# Patient Record
Sex: Female | Born: 1970 | Race: Black or African American | Hispanic: No | Marital: Single | Smoking: Former smoker
Health system: Southern US, Community
[De-identification: ages and names within clinical notes are randomized; demographics above are authoritative.]

## PROBLEM LIST (undated history)

## (undated) DIAGNOSIS — I10 Essential (primary) hypertension: Secondary | ICD-10-CM

## (undated) DIAGNOSIS — F329 Major depressive disorder, single episode, unspecified: Secondary | ICD-10-CM

## (undated) DIAGNOSIS — K219 Gastro-esophageal reflux disease without esophagitis: Secondary | ICD-10-CM

## (undated) DIAGNOSIS — M7500 Adhesive capsulitis of unspecified shoulder: Secondary | ICD-10-CM

## (undated) DIAGNOSIS — Z8679 Personal history of other diseases of the circulatory system: Secondary | ICD-10-CM

## (undated) DIAGNOSIS — J45909 Unspecified asthma, uncomplicated: Secondary | ICD-10-CM

## (undated) DIAGNOSIS — G4730 Sleep apnea, unspecified: Secondary | ICD-10-CM

## (undated) DIAGNOSIS — F32A Depression, unspecified: Secondary | ICD-10-CM

## (undated) DIAGNOSIS — F419 Anxiety disorder, unspecified: Secondary | ICD-10-CM

## (undated) DIAGNOSIS — R102 Pelvic and perineal pain: Secondary | ICD-10-CM

## (undated) DIAGNOSIS — G43909 Migraine, unspecified, not intractable, without status migrainosus: Secondary | ICD-10-CM

## (undated) DIAGNOSIS — R21 Rash and other nonspecific skin eruption: Secondary | ICD-10-CM

## (undated) DIAGNOSIS — E7800 Pure hypercholesterolemia, unspecified: Secondary | ICD-10-CM

## (undated) HISTORY — DX: Depression, unspecified: F32.A

## (undated) HISTORY — PX: CARPAL TUNNEL RELEASE: SHX101

## (undated) HISTORY — PX: ABDOMINAL HYSTERECTOMY: SHX81

## (undated) HISTORY — DX: Adhesive capsulitis of unspecified shoulder: M75.00

## (undated) HISTORY — DX: Major depressive disorder, single episode, unspecified: F32.9

## (undated) HISTORY — DX: Anxiety disorder, unspecified: F41.9

## (undated) HISTORY — DX: Migraine, unspecified, not intractable, without status migrainosus: G43.909

## (undated) HISTORY — DX: Sleep apnea, unspecified: G47.30

---

## 2004-01-20 ENCOUNTER — Emergency Department (HOSPITAL_COMMUNITY): Admission: EM | Admit: 2004-01-20 | Discharge: 2004-01-20 | Payer: Self-pay | Admitting: Emergency Medicine

## 2004-02-03 ENCOUNTER — Emergency Department (HOSPITAL_COMMUNITY): Admission: EM | Admit: 2004-02-03 | Discharge: 2004-02-04 | Payer: Self-pay | Admitting: Emergency Medicine

## 2005-07-09 ENCOUNTER — Emergency Department (HOSPITAL_COMMUNITY): Admission: EM | Admit: 2005-07-09 | Discharge: 2005-07-09 | Payer: Self-pay | Admitting: Emergency Medicine

## 2006-01-17 ENCOUNTER — Emergency Department (HOSPITAL_COMMUNITY): Admission: EM | Admit: 2006-01-17 | Discharge: 2006-01-17 | Payer: Self-pay | Admitting: Emergency Medicine

## 2006-01-18 ENCOUNTER — Emergency Department (HOSPITAL_COMMUNITY): Admission: EM | Admit: 2006-01-18 | Discharge: 2006-01-18 | Payer: Self-pay | Admitting: Emergency Medicine

## 2006-04-04 ENCOUNTER — Emergency Department (HOSPITAL_COMMUNITY): Admission: EM | Admit: 2006-04-04 | Discharge: 2006-04-04 | Payer: Self-pay | Admitting: Emergency Medicine

## 2010-09-02 HISTORY — PX: ABDOMINAL HYSTERECTOMY: SHX81

## 2012-11-23 ENCOUNTER — Other Ambulatory Visit (HOSPITAL_COMMUNITY): Payer: Self-pay | Admitting: Orthopedic Surgery

## 2012-11-23 DIAGNOSIS — M25562 Pain in left knee: Secondary | ICD-10-CM

## 2012-11-23 DIAGNOSIS — M25669 Stiffness of unspecified knee, not elsewhere classified: Secondary | ICD-10-CM

## 2016-04-16 ENCOUNTER — Encounter: Payer: Self-pay | Admitting: Pulmonary Disease

## 2016-04-16 LAB — PULMONARY FUNCTION TEST

## 2016-09-26 ENCOUNTER — Encounter: Payer: Self-pay | Admitting: Orthopaedic Surgery

## 2017-09-02 HISTORY — PX: BACK SURGERY: SHX140

## 2018-01-24 ENCOUNTER — Encounter: Payer: Self-pay | Admitting: Pulmonary Disease

## 2018-01-24 LAB — PULMONARY FUNCTION TEST

## 2018-04-21 HISTORY — PX: HAND TENDON SURGERY: SHX663

## 2018-06-17 ENCOUNTER — Encounter (HOSPITAL_COMMUNITY): Payer: Self-pay

## 2018-06-17 ENCOUNTER — Other Ambulatory Visit: Payer: Self-pay

## 2018-06-17 ENCOUNTER — Emergency Department (HOSPITAL_COMMUNITY)
Admission: EM | Admit: 2018-06-17 | Discharge: 2018-06-17 | Disposition: A | Discharge status: Home / Self Care | Payer: Self-pay | Attending: Emergency Medicine | Admitting: Emergency Medicine

## 2018-06-17 DIAGNOSIS — J45909 Unspecified asthma, uncomplicated: Secondary | ICD-10-CM | POA: Insufficient documentation

## 2018-06-17 DIAGNOSIS — Z91010 Allergy to peanuts: Secondary | ICD-10-CM | POA: Insufficient documentation

## 2018-06-17 DIAGNOSIS — M25512 Pain in left shoulder: Secondary | ICD-10-CM | POA: Insufficient documentation

## 2018-06-17 DIAGNOSIS — I10 Essential (primary) hypertension: Secondary | ICD-10-CM | POA: Insufficient documentation

## 2018-06-17 HISTORY — DX: Essential (primary) hypertension: I10

## 2018-06-17 HISTORY — DX: Unspecified asthma, uncomplicated: J45.909

## 2018-06-17 MED ORDER — PREDNISONE 20 MG PO TABS
60.0000 mg | ORAL_TABLET | Freq: Once | ORAL | Status: AC
  Administered 2018-06-17: 60 mg via ORAL
  Filled 2018-06-17: qty 3

## 2018-06-17 MED ORDER — KETOROLAC TROMETHAMINE 60 MG/2ML IM SOLN
60.0000 mg | Freq: Once | INTRAMUSCULAR | Status: AC
  Administered 2018-06-17: 60 mg via INTRAMUSCULAR
  Filled 2018-06-17: qty 2

## 2018-06-17 MED ORDER — PREDNISONE 20 MG PO TABS: 40.0000 mg | ORAL_TABLET | Freq: Every day | ORAL | 0 refills | Status: AC

## 2018-06-17 MED ORDER — OXYCODONE-ACETAMINOPHEN 5-325 MG PO TABS
2.0000 | ORAL_TABLET | Freq: Once | ORAL | Status: AC
Start: 2018-06-17 — End: 2018-06-17
  Administered 2018-06-17: 2 via ORAL
  Filled 2018-06-17: qty 2

## 2018-06-17 NOTE — ED Triage Notes (Signed)
Pt c/o increasing left shoulder and back pain. States she was Dx with torn tendons left shoulder a month ago, carpal tunnel surgery 6 weks ago to left hand, and S-1. L-5 spinal fusion surgery February 1st.

## 2018-06-17 NOTE — Discharge Instructions (Addendum)
Please call the orthopedic team for follow up

## 2018-06-17 NOTE — ED Provider Notes (Signed)
[facility] DEPT Provider Note   CSN: [id number] Arrival date & time: 06/17/18  0749     History   Chief Complaint Chief Complaint  Patient presents with  . Shoulder Pain    HPI Kathy Hodge is a 47 y.o. female.  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  Her orthopedic team and prior MRI were all performed in [place].  She just relocated to [place] to be closer to family.  She does not have an orthopedic team locally.  She currently does not have health insurance in the state of [place] either.  She presents with worsening severe pain of her left shoulder.  She states that she has oxycodone and muscle relaxants at home and these have not been helping.  She denies fevers and chills.  She reports sharp radiating pain coming down her left shoulder.  She has a history of prior cervical spine surgery.  She recently underwent left carpal tunnel release in [place] as well.  She is on disability reports will be several weeks until her disability allows her to have access to healthcare in the state [place].  Her pain is moderate to severe in severity.  She does not have a sling or shoulder immobilizer at home.   Past Medical History:  Diagnosis Date  . Asthma   . Hypertension     There are no active problems to display for this patient.   ** The histories are not reviewed yet. Please review them in the "History" navigator section and refresh this [place].   OB History   None      Home Medications    Prior to Admission medications   Medication Sig Start Date End Date Taking? Authorizing Provider  predniSONE (DELTASONE) 20 MG tablet Take 2 tablets (40 mg total) by mouth daily for 5 days. 06/17/18 06/22/18  Jola Schmidt, MD    Family History No family history on file.  Social  History Social History   Tobacco Use  . Smoking status: Not on file  Substance Use Topics  . Alcohol use: Not on file  . Drug use: Not on file     Allergies   Peanut-containing drug products   Review of Systems Review of Systems  All other systems reviewed and are negative.    Physical Exam Updated Vital Signs BP 117/75   Pulse 64   Temp 97.9 F (36.6 C) (Oral)   Resp 16   Ht 5\' 10"  (1.778 m)   Wt 104.3 kg   SpO2 99%   BMI 33.00 kg/m   Physical Exam  Constitutional: She is oriented to person, place, and time. She appears well-developed and well-nourished.  HENT:  Head: Normocephalic.  Eyes: EOM are normal.  Neck: Normal range of motion.  Pulmonary/Chest: Effort normal.  Abdominal: She exhibits no distension.  Musculoskeletal:  Mild pain with range of motion of left shoulder.  Normal left radial pulse.  Normal grip strength left hand.  Full range of motion of left wrist and left elbow.  No swelling of the left upper extremity as compared to the right.  Neurological: She is alert and oriented to person, place, and time.  Psychiatric: She has a normal mood and affect.  Nursing note and vitals reviewed.    ED Treatments / Results  Labs (all labs ordered are listed,  but only abnormal results are displayed) Labs Reviewed - No data to display  EKG None  Radiology No results found.  Procedures Procedures (including critical care time)  Medications Ordered in ED Medications  oxyCODONE-acetaminophen (PERCOCET/ROXICET) 5-325 MG per tablet 2 tablet (has no administration in time range)  ketorolac (TORADOL) injection 60 mg (has no administration in time range)  predniSONE (DELTASONE) tablet 60 mg (has no administration in time range)     Initial Impression / Assessment and Plan / ED Course  I have reviewed the triage vital signs and the nursing notes.  Pertinent labs & imaging results that were available during my care of the patient were reviewed by me  and considered in my medical decision making (see chart for details).     Symptomatic management here in the emergency department.  Prednisone, ketorolac, Percocet.  Patient informed that she will be unable to obtain opioid prescriptions from the emergency department.  At this time she still has enough opioid medications at home to help with pain control.  She has been given the number of the orthopedic team on call.  She has copies of her MRI as well as burn CDs with the images.  She will need to access care by the orthopedic team once her insurance situation is fixed.  No signs to suggest septic arthritis.  No indication for additional management or treatment here in the emergency department.  Patient will be discharged home with a short course of prednisone to help with inflammation.  Final Clinical Impressions(s) / ED Diagnoses   Final diagnoses:  Acute pain of left shoulder    ED Discharge Orders         Ordered    predniSONE (DELTASONE) 20 MG tablet  Daily     06/17/18 0838           Jola Schmidt, MD 06/17/18 [phone]

## 2018-06-23 ENCOUNTER — Ambulatory Visit (INDEPENDENT_AMBULATORY_CARE_PROVIDER_SITE_OTHER): Payer: Self-pay | Admitting: Family Medicine

## 2018-06-23 ENCOUNTER — Encounter: Payer: Self-pay | Admitting: Family Medicine

## 2018-06-23 ENCOUNTER — Other Ambulatory Visit: Payer: Self-pay

## 2018-06-23 VITALS — BP 113/74 | HR 74 | Temp 98.3°F | Resp 17 | Ht 70.0 in | Wt 236.8 lb

## 2018-06-23 DIAGNOSIS — Z23 Encounter for immunization: Secondary | ICD-10-CM

## 2018-06-23 DIAGNOSIS — Z130 Encounter for screening for diseases of the blood and blood-forming organs and certain disorders involving the immune mechanism: Secondary | ICD-10-CM

## 2018-06-23 DIAGNOSIS — G8929 Other chronic pain: Secondary | ICD-10-CM

## 2018-06-23 DIAGNOSIS — Z13220 Encounter for screening for lipoid disorders: Secondary | ICD-10-CM

## 2018-06-23 DIAGNOSIS — G5601 Carpal tunnel syndrome, right upper limb: Secondary | ICD-10-CM

## 2018-06-23 DIAGNOSIS — Z9889 Other specified postprocedural states: Secondary | ICD-10-CM | POA: Insufficient documentation

## 2018-06-23 DIAGNOSIS — Z9989 Dependence on other enabling machines and devices: Secondary | ICD-10-CM | POA: Insufficient documentation

## 2018-06-23 DIAGNOSIS — G4733 Obstructive sleep apnea (adult) (pediatric): Secondary | ICD-10-CM

## 2018-06-23 DIAGNOSIS — J4520 Mild intermittent asthma, uncomplicated: Secondary | ICD-10-CM

## 2018-06-23 DIAGNOSIS — I10 Essential (primary) hypertension: Secondary | ICD-10-CM

## 2018-06-23 DIAGNOSIS — Z131 Encounter for screening for diabetes mellitus: Secondary | ICD-10-CM

## 2018-06-23 DIAGNOSIS — M25512 Pain in left shoulder: Secondary | ICD-10-CM

## 2018-06-23 DIAGNOSIS — Z981 Arthrodesis status: Secondary | ICD-10-CM

## 2018-06-23 DIAGNOSIS — M67912 Unspecified disorder of synovium and tendon, left shoulder: Secondary | ICD-10-CM

## 2018-06-23 MED ORDER — ZOLPIDEM TARTRATE 5 MG PO TABS: 5.0000 mg | ORAL_TABLET | Freq: Every evening | ORAL | 0 refills | Status: DC | PRN

## 2018-06-23 NOTE — Patient Instructions (Addendum)
Go to community he will have labs drawn medication: [facility]  [address], [place], [place] [address] [phone]     .Thank you for choosing [facility] at [facility] for your medical home!    Kathy Hodge was seen by Molli Barrows, FNP today.   Kathy Hodge's primary care is Scot Jun, FNP.   For the best care possible,  you should try to see Molli Barrows, FNP  whenever you come to clinic.   We look forward to seeing you again soon!  If you have any questions about your visit today,  please call us at   Or feel free to reach your provider via [place].

## 2018-06-23 NOTE — Progress Notes (Signed)
Kathy Hodge, is a 47 y.o. female  YQM:[id number]  BMW:[id number]  DOB - 15-Sep-1970  CC:  Chief Complaint  Patient presents with  . Establish Care  . Hypertension  . Asthma    states that her breathing has been ok. health dept recently refilled inhaler & nebulizer solution       HPI: Kathy Hodge is a 47 y.o. female is here today to establish care.   Kathy Hodge has Other chronic pain; Essential hypertension; S/P spinal fusion; S/P carpal tunnel release Left ;Disorder of left rotator cuff; OSA on CPAP; Carpal tunnel syndrome of right wrist; and Intermittent asthma without complication on their problem list.  Today's visit:  Kathy Hodge recently relocated from [place] to live here in [place] to be close to family.  She has had a recent surgery including a spinal fusion (10/03/2017) and  left carpal tunnel release (04/21/18), she suffers from chronic back pain and currently has a disorder of the left rotator cuff for which she presented to the ER due to acute pain 06/17/2018. She is currently prescribed chronic pain medication-Percocet for which she is out of medication although has a prescription from her last office visit in [place] less than one month ago. Uncertain if prescription is able to be filled here in [place] although she hasn't tried. In [place], she was previously followed by the following specialties: Neurology, orthopedic surgery, pulmonology and pain management.  She is in the process of having her Medicaid/ Medicare transferred [place] as she is on disability. She is requesting a refills of Ambien and will need Tylenol 3 if she is unable to fill her prior prescription for Percocet.  She is requesting a referral to pain management to continue her current pain medication regimen.  [name] also suffers from hypertension which has been well-controlled with currently medication. She reports a history of LVH which was found after a cardiology consult which lead to a sleep study, which  subsequently lead to a diagnosis of OSA and she currently wears a CPAP. Denies chest pain, palpations, wheezing, shortness of breath, new weakness , numbness or tingling,or edema. Current medications: Current Outpatient Medications:  .  albuterol (PROVENTIL HFA;VENTOLIN HFA) 108 (90 Base) MCG/ACT inhaler, Inhale 1-2 puffs into the lungs every 6 (six) hours as needed for wheezing or shortness of breath., Disp: , Rfl:  .  albuterol (PROVENTIL) (2.5 MG/3ML) 0.083% nebulizer solution, Take 2.5 mg by nebulization every 6 (six) hours as needed for wheezing or shortness of breath., Disp: , Rfl:  .  amLODipine (NORVASC) 5 MG tablet, Take 5 mg by mouth daily., Disp: , Rfl:  .  fexofenadine-pseudoephedrine (ALLEGRA-D) 60-120 MG 12 hr tablet, Take 1 tablet by mouth 2 (two) times daily., Disp: , Rfl:  .  Fluticasone-Salmeterol 113-14 MCG/ACT AEPB, Inhale 1 puff into the lungs 2 (two) times daily., Disp: , Rfl:  .  gabapentin (NEURONTIN) 600 MG tablet, Take 600 mg by mouth 3 (three) times daily., Disp: , Rfl:  .  hydrochlorothiazide (MICROZIDE) 12.5 MG capsule, Take 12.5 mg by mouth daily., Disp: , Rfl:  .  montelukast (SINGULAIR) 10 MG tablet, Take 10 mg by mouth at bedtime., Disp: , Rfl:  .  oxyCODONE-acetaminophen (PERCOCET) 7.5-325 MG tablet, Take 1 tablet by mouth every 4 (four) hours as needed for severe pain., Disp: , Rfl:  .  topiramate (TOPAMAX) 25 MG tablet, Take 25 mg by mouth 2 (two) times daily., Disp: , Rfl:  .  zolpidem (AMBIEN) 5 MG tablet, Take 5  mg by mouth at bedtime as needed for sleep., Disp: , Rfl:    Pertinent family medical history: family history includes Bone cancer in her father; COPD in her mother; Diabetes in her mother.   Allergies  Allergen Reactions  . Peanut-Containing Drug Products Anaphylaxis    Social History   Socioeconomic History  . Marital status: Single    Spouse name: Not on file  . Number of children: Not on file  . Years of education: Not on file  . Highest  education level: Not on file  Occupational History  . Not on file  Social Needs  . Financial resource strain: Not on file  . Food insecurity:    Worry: Not on file    Inability: Not on file  . Transportation needs:    Medical: Not on file    Non-medical: Not on file  Tobacco Use  . Smoking status: Former Research scientist (life sciences)  . Smokeless tobacco: Never Used  Substance and Sexual Activity  . Alcohol use: Not Currently  . Drug use: Not on file  . Sexual activity: Not on file  Lifestyle  . Physical activity:    Days per week: Not on file    Minutes per session: Not on file  . Stress: Not on file  Relationships  . Social connections:    Talks on phone: Not on file    Gets together: Not on file    Attends religious service: Not on file    Active member of club or organization: Not on file    Attends meetings of clubs or organizations: Not on file    Relationship status: Not on file  . Intimate partner violence:    Fear of current or ex partner: Not on file    Emotionally abused: Not on file    Physically abused: Not on file    Forced sexual activity: Not on file  Other Topics Concern  . Not on file  Social History Narrative  . Not on file    Review of Systems: Constitutional: Negative for fever, chills, diaphoresis, activity change, appetite change and fatigue. HENT: Negative for ear pain, nosebleeds, congestion, facial swelling, rhinorrhea, neck pain, neck stiffness and ear discharge.  Eyes: Negative for pain, discharge, redness, itching and visual disturbance. Respiratory: Negative for cough, choking, chest tightness, shortness of breath, wheezing and stridor.  Cardiovascular: Negative for chest pain, palpitations and leg swelling. Musculoskeletal: Positive back pain, Positive arthralgia and pain with walking and standing. Neurological: Negative for dizziness, tremors, seizures, syncope, facial asymmetry, speech difficulty, weakness, light-headedness, numbness and headaches.   Psychiatric/Behavioral: Negative for hallucinations, behavioral problems, confusion, dysphoric mood, decreased concentration and agitation.  Objective:   Vitals:   06/23/18 0918  BP: 113/74  Pulse: 74  Resp: 17  Temp: 98.3 F (36.8 C)    BP Readings from Last 3 Encounters:  06/23/18 113/74  06/17/18 (!) 163/98    Filed Weights   06/23/18 0918  Weight: 236 lb 12.8 oz (107.4 kg)      Physical Exam: Constitutional: Patient appears well-developed and well-nourished. No distress. HENT: Normocephalic, atraumatic, External right and left ear normal. Oropharynx is clear and moist.  Eyes: Conjunctivae and EOM are normal. PERRLA, no scleral icterus. Neck: Normal ROM. Neck supple. No JVD. No tracheal deviation. No thyromegaly. CVS: RRR, S1/S2 +, no murmurs, no gallops, no carotid bruit.  Pulmonary: Effort and breath sounds normal, no stridor, rhonchi, wheezes, rales.  Abdominal: Soft. BS +, no distension, tenderness, rebound or guarding.  Musculoskeletal:  deferred exam given patient current level of pain she could not ambulate to the exam table.  Neuro: Alert. Oriented. Abnormal gait.   Skin: Skin is warm and dry. No rash noted. Not diaphoretic. No erythema. No pallor. Psychiatric: Normal mood and affect. Behavior, judgment, thought content normal.    Assessment and plan:  1. Other chronic pain -Obtained a urine drug screen today. Agreed to bridge patient with Tylenol 3 until she is able to be seen by pain management. She currently has a prescription from her provider in [place]. However has not tried to fill medication here in [place]. Advised if unable to fill prescription by previous provider, pending UDS results will bridge until acceptance into pain management.  - [id number] 11+Oxyco+Alc+Crt-Bund - Ambulatory referral to Pain Clinic  2. Screening for diabetes mellitus - Hemoglobin A1c; Future  3. Essential hypertension, well controlled today. We have discussed target BP range and blood  pressure goal. I have advised patient to check BP regularly and to call us back or report to clinic if the numbers are consistently higher than 140/90. We discussed the importance of compliance with medical therapy and DASH diet recommended, consequences of uncontrolled hypertension discussed. Continue current BP medications - Comprehensive metabolic panel - Thyroid Panel With TSH; Future  4. Screening, lipid - Lipid panel; Future  5. Screening for deficiency anemia - CBC with Differential; Future  6. S/P spinal fusion - Ambulatory referral to Neurology - Ambulatory referral to Pain Clinic - Ambulatory referral to Orthopedic Surgery  7. S/P carpal tunnel release Left  - Ambulatory referral to Neurology - Ambulatory referral to Pain Clinic - Ambulatory referral to Orthopedic Surgery  8. Disorder of left rotator cuff - Ambulatory referral to Pain Clinic - Ambulatory referral to Orthopedic Surgery  9. Intermittent asthma without complication, unspecified asthma severity, stable -Flu shot given today -Recommended consistently taking antihistamines to prevent an asthma exacerbation given the recent move to [facility]. - Ambulatory referral to Pulmonology  10. OSA on CPAP - Ambulatory referral to Pulmonology  11. Carpal tunnel syndrome of right wrist - Ambulatory referral to Orthopedic Surgery  12. Need for immunization against influenza - Flu Vaccine QUAD 36+ mos IM  Return in about 1 month (around 07/24/2018), or f/u pain, BP, chronic condition, labs results.   Paper prescription for Ambien given to patient.  Meds ordered this encounter  Medications  . zolpidem (AMBIEN) 5 MG tablet    Sig: Take 1 tablet (5 mg total) by mouth at bedtime as needed for sleep.    Dispense:  20 tablet    Refill:  0    Orders Placed This Encounter  Procedures  . Flu Vaccine QUAD 36+ mos IM  . [id number] 11+Oxyco+Alc+Crt-Bund  . Hemoglobin A1c    Standing Status:   Future    Standing Expiration  Date:   06/24/2019  . Comprehensive metabolic panel    Standing Status:   Future    Standing Expiration Date:   06/24/2019    Order Specific Question:   Has the patient fasted?    Answer:   No  . CBC with Differential    Standing Status:   Future    Standing Expiration Date:   06/24/2019  . Thyroid Panel With TSH    Standing Status:   Future    Standing Expiration Date:   06/24/2019  . Lipid panel    Standing Status:   Future    Standing Expiration Date:   06/24/2019    Order Specific Question:  Has the patient fasted?    Answer:   No  . Ambulatory referral to Neurology    Referral Priority:   Routine    Referral Type:   Consultation    Referral Reason:   Specialty Services Required    Requested Specialty:   Neurology    Number of Visits Requested:   1  . Ambulatory referral to Pulmonology    Referral Priority:   Routine    Referral Type:   Consultation    Referral Reason:   Specialty Services Required    Requested Specialty:   Pulmonary Disease    Number of Visits Requested:   1  . Ambulatory referral to Pain Clinic    Referral Priority:   Routine    Referral Type:   Consultation    Referral Reason:   Specialty Services Required    Requested Specialty:   Pain Medicine    Number of Visits Requested:   1  . Ambulatory referral to Orthopedic Surgery    Referral Priority:   Routine    Referral Type:   Surgical    Referral Reason:   Specialty Services Required    Requested Specialty:   Orthopedic Surgery    Number of Visits Requested:   1    The patient was given clear instructions to go to ER or return to medical center if symptoms don't improve, worsen or new problems develop. The patient verbalized understanding. The patient was advised  to call and obtain lab results if they haven't heard anything from out office within 7-10 business days.  Molli Barrows, FNP Primary Care at [facility] [address], [place] [phone]:  [phone]    A total of 45 minutes spent, greater than 50 % of this time was spent counseling and coordination of care.  This note has been created with Surveyor, quantity. Any transcriptional errors are unintentional.

## 2018-06-26 LAB — DRUG SCREEN 764883 11+OXYCO+ALC+CRT-BUND
Amphetamines, Urine: NEGATIVE ng/mL
BENZODIAZ UR QL: NEGATIVE ng/mL
Cannabinoid Quant, Ur: NEGATIVE ng/mL
Cocaine (Metabolite): NEGATIVE ng/mL
Creatinine: 202.4 mg/dL (ref 20.0–300.0)
ETHANOL: NEGATIVE %
METHADONE SCREEN, URINE: NEGATIVE ng/mL
Meperidine: NEGATIVE ng/mL
OPIATE SCREEN URINE: NEGATIVE ng/mL
Phencyclidine: NEGATIVE ng/mL
Propoxyphene: NEGATIVE ng/mL
TRAMADOL: NEGATIVE ng/mL
pH, Urine: 6.5 (ref 4.5–8.9)

## 2018-06-26 LAB — OXYCODONE/OXYMORPHONE, CONFIRM
OXYCODONE/OXYMORPH: POSITIVE — AB
OXYCODONE: NEGATIVE
OXYMORPHONE (GC/MS): 774 ng/mL
OXYMORPHONE: POSITIVE — AB

## 2018-06-26 LAB — DRUG PROFILE 799023
Amobarbital: NEGATIVE
BARBITURATES: POSITIVE — AB
BUTALBITAL GC/MS: 219 ng/mL
Butalbital: POSITIVE — AB
PENTOBARBITAL LVL: NEGATIVE
Phenobarbital: NEGATIVE
Secobarbital: NEGATIVE

## 2018-07-23 ENCOUNTER — Institutional Professional Consult (permissible substitution): Payer: Self-pay | Admitting: Pulmonary Disease

## 2018-07-24 ENCOUNTER — Encounter: Payer: Self-pay | Admitting: Family Medicine

## 2018-07-24 ENCOUNTER — Ambulatory Visit: Payer: Medicaid Other | Admitting: Family Medicine

## 2018-07-24 VITALS — BP 121/76 | HR 55 | Temp 98.2°F | Resp 18 | Ht 69.0 in | Wt 236.0 lb

## 2018-07-24 DIAGNOSIS — Z981 Arthrodesis status: Secondary | ICD-10-CM | POA: Diagnosis not present

## 2018-07-24 DIAGNOSIS — Z9889 Other specified postprocedural states: Secondary | ICD-10-CM

## 2018-07-24 DIAGNOSIS — J4520 Mild intermittent asthma, uncomplicated: Secondary | ICD-10-CM

## 2018-07-24 DIAGNOSIS — G5601 Carpal tunnel syndrome, right upper limb: Secondary | ICD-10-CM

## 2018-07-24 DIAGNOSIS — Z130 Encounter for screening for diseases of the blood and blood-forming organs and certain disorders involving the immune mechanism: Secondary | ICD-10-CM

## 2018-07-24 DIAGNOSIS — I10 Essential (primary) hypertension: Secondary | ICD-10-CM

## 2018-07-24 DIAGNOSIS — G8929 Other chronic pain: Secondary | ICD-10-CM

## 2018-07-24 DIAGNOSIS — G4709 Other insomnia: Secondary | ICD-10-CM

## 2018-07-24 DIAGNOSIS — Z131 Encounter for screening for diabetes mellitus: Secondary | ICD-10-CM

## 2018-07-24 MED ORDER — TRAMADOL HCL 50 MG PO TABS: 50.0000 mg | ORAL_TABLET | Freq: Three times a day (TID) | ORAL | 0 refills | Status: AC | PRN

## 2018-07-24 MED ORDER — ZOLPIDEM TARTRATE 5 MG PO TABS: 5.0000 mg | ORAL_TABLET | Freq: Every evening | ORAL | 1 refills | Status: DC | PRN

## 2018-07-24 NOTE — Progress Notes (Signed)
Patient ID: Kathy Hodge, female    DOB: [birth year], 47 y.o.   MRN: [id number]  PCP: Scot Jun, FNP  Chief Complaint  Patient presents with  . Follow-up    Subjective:  HPI Kathy Hodge is a 47 y.o. female presents for evaluation presents for follow-up from recent visit in which she establish care.  Patient suffers from chronic pain related to chronic musculoskeletal conditions. She is here today as her Medicaid was recently approved and she needs to follow-up on prior referral request. She needs a new referral placed to [facility] and has been contacted by [facility] regarding shoulder and carpal tunnel repair.   During her last visit her blood pressure was elevated.  Blood pressures well controlled today.  She reports consistently taking medications.  Denies chest pain, shortness of breath, lower extremity edema.  She is limited with exercise given her chronic pain and MSK problems. She is here today for labs although she is not fasting. Social History   Socioeconomic History  . Marital status: Single    Spouse name: Not on file  . Number of children: Not on file  . Years of education: Not on file  . Highest education level: Not on file  Occupational History  . Not on file  Social Needs  . Financial resource strain: Not on file  . Food insecurity:    Worry: Not on file    Inability: Not on file  . Transportation needs:    Medical: Not on file    Non-medical: Not on file  Tobacco Use  . Smoking status: Former Research scientist (life sciences)  . Smokeless tobacco: Never Used  Substance and Sexual Activity  . Alcohol use: Not Currently  . Drug use: Not on file  . Sexual activity: Yes  Lifestyle  . Physical activity:    Days per week: Not on file    Minutes per session: Not on file  . Stress: Not on file  Relationships  . Social connections:    Talks on phone: Not on file    Gets together: Not on file    Attends religious service: Not on file    Active member  of club or organization: Not on file    Attends meetings of clubs or organizations: Not on file    Relationship status: Not on file  . Intimate partner violence:    Fear of current or ex partner: Not on file    Emotionally abused: Not on file    Physically abused: Not on file    Forced sexual activity: Not on file  Other Topics Concern  . Not on file  Social History Narrative  . Not on file    Family History  Problem Relation Age of Onset  . COPD Mother   . Diabetes Mother   . Bone cancer Father    Review of Systems  Pertinent negatives listed in HPI  Patient Active Problem List   Diagnosis Date Noted  . Other chronic pain 06/23/2018  . Essential hypertension 06/23/2018  . S/P spinal fusion 06/23/2018  . S/P carpal tunnel release Left  06/23/2018  . Disorder of left rotator cuff 06/23/2018  . OSA on CPAP 06/23/2018  . Carpal tunnel syndrome of right wrist 06/23/2018  . Intermittent asthma without complication 88/41/6606    Allergies  Allergen Reactions  . Peanut-Containing Drug Products Anaphylaxis    Prior to Admission medications   Medication Sig Start Date End Date Taking? Authorizing Provider  albuterol (PROVENTIL HFA;VENTOLIN HFA)  108 (90 Base) MCG/ACT inhaler Inhale 1-2 puffs into the lungs every 6 (six) hours as needed for wheezing or shortness of breath.   Yes [provider]  albuterol (PROVENTIL) (2.5 MG/3ML) 0.083% nebulizer solution Take 2.5 mg by nebulization every 6 (six) hours as needed for wheezing or shortness of breath.   Yes [provider]  amLODipine (NORVASC) 5 MG tablet Take 5 mg by mouth daily.   Yes [provider]  fexofenadine-pseudoephedrine (ALLEGRA-D) 60-120 MG 12 hr tablet Take 1 tablet by mouth 2 (two) times daily.   Yes [provider]  Fluticasone-Salmeterol 113-14 MCG/ACT AEPB Inhale 1 puff into the lungs 2 (two) times daily.   Yes [provider]  gabapentin (NEURONTIN) 600 MG tablet Take  600 mg by mouth 3 (three) times daily.   Yes [provider]  hydrochlorothiazide (MICROZIDE) 12.5 MG capsule Take 12.5 mg by mouth daily.   Yes [provider]  montelukast (SINGULAIR) 10 MG tablet Take 10 mg by mouth at bedtime.   Yes [provider]  oxyCODONE-acetaminophen (PERCOCET) 7.5-325 MG tablet Take 1 tablet by mouth every 4 (four) hours as needed for severe pain.   Yes [provider]  topiramate (TOPAMAX) 25 MG tablet Take 25 mg by mouth 2 (two) times daily.   Yes [provider]  zolpidem (AMBIEN) 5 MG tablet Take 1 tablet (5 mg total) by mouth at bedtime as needed for sleep. 06/23/18  Yes Scot Jun, FNP    Past Medical, Surgical Family and Social History reviewed and updated.    Objective:   Today's Vitals   07/24/18 1010  BP: 121/76  Pulse: (!) 55  Resp: 18  Temp: 98.2 F (36.8 C)  TempSrc: Oral  SpO2: 100%  Weight: 236 lb (107 kg)  Height: 5\' 9"  (1.753 m)  PainSc: 0-No pain    Wt Readings from Last 3 Encounters:  07/24/18 236 lb (107 kg)  06/23/18 236 lb 12.8 oz (107.4 kg)  06/17/18 230 lb (104.3 kg)   Physical Exam General appearance: alert, well developed, well nourished, cooperative and in no distress Head: Normocephalic, without obvious abnormality, atraumatic Respiratory: Respirations even and unlabored, normal respiratory rate Heart: rate and rhythm normal. No gallop or murmurs noted on exam  Extremities: No gross deformities Skin: Skin color, texture, turgor normal. No rashes seen  Psych: Appropriate mood and affect. Neurologic: Mental status: Alert, oriented to person, place, and time, thought content appropriate.    Assessment & Plan:  1. Essential hypertension, well controlled today. We have discussed target BP range and blood pressure goal. I have advised patient to check BP regularly and to call us back or report to clinic if the numbers are consistently higher than 140/90. We discussed the  importance of compliancce with medical therapy and DASH diet recommended, consequences of uncontrolled hypertension discussed. ontinue current BP medications 2. Carpal tunnel syndrome of right wrist 3. S/P spinal fusion 4. S/P carpal tunnel release Left  -Patient has been referred to [facility] however initially did not have insurance.  Her Medicaid has since been approved will refer her back to [facility] to have an appointment scheduled for further evaluation and management of the aforementioned conditions.  She is also been referred to pain management however agreed to bridge her with tramadol 50 mg every 6 hours as needed for pain until pain management referral has been accepted.  Patient has signed a medication contract.  5. Intermittent asthma without complication, unspecified asthma severity,  symptoms controlled today -Patient has been referred to pulmonology Continue albuterol inhaler every 4-6 hours as needed for shortness of breath and or wheezing.  6. Other insomnia, - controlled with Ambien   7. Other chronic pain -Referral placed to pain management -We will bridge tramadol 50 mg every 6 hours as needed for pain until patient is accepted into pain management Medication contract on file.   Meds ordered this encounter  Medications  . traMADol (ULTRAM) 50 MG tablet    Sig: Take 1 tablet (50 mg total) by mouth every 8 (eight) hours as needed.    Dispense:  90 tablet    Refill:  0    Call office for additional refills. No refill before 08/23/2018  . zolpidem (AMBIEN) 5 MG tablet    Sig: Take 1 tablet (5 mg total) by mouth at bedtime as needed for sleep.    Dispense:  30 tablet    Refill:  1     -The patient was given clear instructions to go to ER or return to medical center if symptoms do not improve, worsen or new problems develop. The patient verbalized understanding.    Molli Barrows, FNP [facility] at [facility] [address]  [place] [phone]: [phone]

## 2018-07-24 NOTE — Patient Instructions (Signed)

## 2018-08-03 ENCOUNTER — Telehealth: Payer: Self-pay | Admitting: Family Medicine

## 2018-08-03 NOTE — Telephone Encounter (Signed)
Referral is already in chart. Was ordered on 07/24/18

## 2018-08-03 NOTE — Telephone Encounter (Signed)
Patient called stating that she now has medicaid of [place] and would like referral to pain management clinic, please follow up.

## 2018-08-07 NOTE — Telephone Encounter (Signed)
Patient came in office to try to get medication refilled. Was advised that provider would look into it. States that she will try to go back to the office where she got it filled.

## 2018-08-07 NOTE — Telephone Encounter (Signed)
Patient called requesting a refill of a muscle relaxer, please follow up

## 2018-08-10 ENCOUNTER — Telehealth: Payer: Self-pay | Admitting: Family Medicine

## 2018-08-10 NOTE — Telephone Encounter (Signed)
Thanks for the update

## 2018-08-10 NOTE — Telephone Encounter (Signed)
Patient stated she is not taking gabapentin and is upset because she wants a refill of tinizidine, patient states she would be switching providers.

## 2018-08-10 NOTE — Telephone Encounter (Signed)
Advise patient  I will not fill tinizidine.  She is currently prescribed gabapentin, tramadol, topomax, and ambiem which are all sedative medications.  She has been referred to pain management. I encourage her to add tylenol to her current regimen. There is no need to prescribe this medication as it has a similar effectiveness on pain as the Gabapentin.

## 2018-08-11 ENCOUNTER — Ambulatory Visit: Payer: Medicaid Other | Admitting: Pulmonary Disease

## 2018-08-11 ENCOUNTER — Encounter: Payer: Self-pay | Admitting: Pulmonary Disease

## 2018-08-11 VITALS — BP 124/88 | HR 62 | Ht 70.0 in | Wt 236.0 lb

## 2018-08-11 DIAGNOSIS — G4700 Insomnia, unspecified: Secondary | ICD-10-CM | POA: Diagnosis not present

## 2018-08-11 DIAGNOSIS — Z9989 Dependence on other enabling machines and devices: Secondary | ICD-10-CM

## 2018-08-11 DIAGNOSIS — G478 Other sleep disorders: Secondary | ICD-10-CM

## 2018-08-11 DIAGNOSIS — G4733 Obstructive sleep apnea (adult) (pediatric): Secondary | ICD-10-CM

## 2018-08-11 DIAGNOSIS — J4540 Moderate persistent asthma, uncomplicated: Secondary | ICD-10-CM

## 2018-08-11 MED ORDER — ZOLPIDEM TARTRATE 10 MG PO TABS: 10.0000 mg | ORAL_TABLET | Freq: Every evening | ORAL | 3 refills | Status: DC | PRN

## 2018-08-11 NOTE — Progress Notes (Signed)
Kathy Hodge    [id number]    [birth year]  Primary Care Physician:Harris, Carroll Sage, FNP  Referring Physician: Scot Jun, [place] [address], [place] [address]  Chief complaint:   History of obstructive sleep apnea History of insomnia  HPI:  Patient with a history of obstructive sleep apnea Has not been able to use her machine recently secondary to having the wrong mask Diagnosed with moderate obstructive sleep apnea Denies any difficulty tolerating the pressures Usually tries to go to bed about 05/04/2009 pM Takes about an hour to fall asleep Usually tries to get up finally at about 6:30 AM Wakes up 4-5 times at night  She has been using Ambien on a regular basis to help sleep History of hypertension, cardiac rhythm problems History of asthma-well-controlled History of allergies History of chronic back pain which may be contributing to multiple awakenings at night  She is a reformed smoker  Outpatient Encounter Medications as of 08/11/2018  Medication Sig  . albuterol (PROVENTIL HFA;VENTOLIN HFA) 108 (90 Base) MCG/ACT inhaler Inhale 1-2 puffs into the lungs every 6 (six) hours as needed for wheezing or shortness of breath.  Marland Kitchen albuterol (PROVENTIL) (2.5 MG/3ML) 0.083% nebulizer solution Take 2.5 mg by nebulization every 6 (six) hours as needed for wheezing or shortness of breath.  Marland Kitchen amLODipine (NORVASC) 5 MG tablet Take 5 mg by mouth daily.  . fexofenadine-pseudoephedrine (ALLEGRA-D) 60-120 MG 12 hr tablet Take 1 tablet by mouth 2 (two) times daily.  . Fluticasone-Salmeterol 113-14 MCG/ACT AEPB Inhale 1 puff into the lungs 2 (two) times daily.  Marland Kitchen gabapentin (NEURONTIN) 600 MG tablet Take 600 mg by mouth 3 (three) times daily.  . hydrochlorothiazide (MICROZIDE) 12.5 MG capsule Take 12.5 mg by mouth daily.  . montelukast (SINGULAIR) 10 MG tablet Take 10 mg by mouth at bedtime.  . topiramate (TOPAMAX) 25 MG tablet Take 25 mg by mouth 2 (two)  times daily.  . traMADol (ULTRAM) 50 MG tablet Take 1 tablet (50 mg total) by mouth every 8 (eight) hours as needed.  . zolpidem (AMBIEN) 5 MG tablet Take 1 tablet (5 mg total) by mouth at bedtime as needed for sleep.   No facility-administered encounter medications on file as of 08/11/2018.     Allergies as of 08/11/2018 - Review Complete 08/11/2018  Allergen Reaction Noted  . Peanut-containing drug products Anaphylaxis 06/17/2018    Past Medical History:  Diagnosis Date  . Asthma   . Hypertension   . Sleep apnea     Past Surgical History:  Procedure Laterality Date  . ABDOMINAL HYSTERECTOMY    . BACK SURGERY    . CESAREAN SECTION    . HAND TENDON SURGERY Left 04/21/2018    Family History  Problem Relation Age of Onset  . COPD Mother   . Diabetes Mother   . Bone cancer Father     Social History   Socioeconomic History  . Marital status: Single    Spouse name: Not on file  . Number of children: Not on file  . Years of education: Not on file  . Highest education level: Not on file  Occupational History  . Not on file  Social Needs  . Financial resource strain: Not on file  . Food insecurity:    Worry: Not on file    Inability: Not on file  . Transportation needs:    Medical: Not on file    Non-medical: Not on file  Tobacco Use  . Smoking status: Former Research scientist (life sciences)  . Smokeless tobacco: Never Used  Substance and Sexual Activity  . Alcohol use: Not Currently  . Drug use: Not on file  . Sexual activity: Yes  Lifestyle  . Physical activity:    Days per week: Not on file    Minutes per session: Not on file  . Stress: Not on file  Relationships  . Social connections:    Talks on phone: Not on file    Gets together: Not on file    Attends religious service: Not on file    Active member of club or organization: Not on file    Attends meetings of clubs or organizations: Not on file    Relationship status: Not on file  . Intimate partner violence:    Fear of  current or ex partner: Not on file    Emotionally abused: Not on file    Physically abused: Not on file    Forced sexual activity: Not on file  Other Topics Concern  . Not on file  Social History Narrative  . Not on file    Review of Systems  Constitutional: Negative.   HENT: Negative.   Eyes: Negative.   Respiratory: Positive for apnea and shortness of breath. Negative for wheezing.   Cardiovascular: Negative.   Gastrointestinal: Negative.   Genitourinary: Negative.   Psychiatric/Behavioral: Positive for sleep disturbance.  All other systems reviewed and are negative.   Vitals:   08/11/18 1423  BP: 124/88  Pulse: 62  SpO2: 97%     Physical Exam  Constitutional: She is oriented to person, place, and time. She appears well-developed and well-nourished.  HENT:  Head: Normocephalic and atraumatic.  Mallampati 3  Eyes: Pupils are equal, round, and reactive to light. Right eye exhibits no discharge. Left eye exhibits no discharge.  Neck: Normal range of motion. Neck supple. No tracheal deviation present. No thyromegaly present.  Cardiovascular: Normal rate and regular rhythm.  Pulmonary/Chest: Effort normal and breath sounds normal. No respiratory distress. She has no wheezes. She has no rales. She exhibits no tenderness.  Abdominal: Soft. Bowel sounds are normal. She exhibits no distension. There is no tenderness.  Musculoskeletal: Normal range of motion. She exhibits no edema or deformity.  Neurological: She is alert and oriented to person, place, and time.   Sleep study was from last year, not available at present  Assessment:  Sleep onset and maintenance insomnia  Moderate obstructive sleep apnea-on CPAP therapy  Plan/Recommendations: Continue CPAP use  We will obtain a download from her machine at next visit  Prescription for Ambien for insomnia was provided  Importance of compliance with CPAP was reiterated  Exercise and weight loss discussed  We will see  her back in the office in about 2 to 3 months Encouraged to call with any significant concerns  Sherrilyn Rist MD [facility] 08/11/2018, 2:26 PM  CC: Scot Jun, FNP

## 2018-08-11 NOTE — Patient Instructions (Signed)
History of obstructive sleep apnea History of insomnia  We will call to get a copy of your sleep study results We will try and get a download from the machine in a couple of months  We will try and set you up with a DME company  I will see back in the office in about 2 months

## 2018-08-14 ENCOUNTER — Encounter (INDEPENDENT_AMBULATORY_CARE_PROVIDER_SITE_OTHER): Payer: Self-pay | Admitting: Orthopaedic Surgery

## 2018-08-14 ENCOUNTER — Ambulatory Visit (INDEPENDENT_AMBULATORY_CARE_PROVIDER_SITE_OTHER): Payer: Medicaid Other | Admitting: Orthopaedic Surgery

## 2018-08-14 VITALS — BP 140/87 | HR 71 | Ht 70.0 in | Wt 230.0 lb

## 2018-08-14 DIAGNOSIS — M25512 Pain in left shoulder: Secondary | ICD-10-CM | POA: Diagnosis not present

## 2018-08-14 DIAGNOSIS — M5030 Other cervical disc degeneration, unspecified cervical region: Secondary | ICD-10-CM

## 2018-08-14 DIAGNOSIS — G8929 Other chronic pain: Secondary | ICD-10-CM

## 2018-08-14 NOTE — Progress Notes (Signed)
Office Visit Note   Patient: Kathy Hodge           Date of Birth: December 22, 1970           MRN: [id number] Visit Date: 08/14/2018              Requested by: Scot Jun, [address], [place] [address] PCP: Scot Jun, FNP   Assessment & Plan: Visit Diagnoses:  1. Chronic left shoulder pain   2. Other cervical disc degeneration, unspecified cervical region     Plan: Complex problem referable to left upper extremity.  Will described below.  MRI scan cervical spine.  Consider manipulation of left shoulder for adhesive capsulitis after reviewing scan of cervical spine.  Mrs. Kathy Hodge has significant problems with the left upper extremity.  I am not sure if her neck is creating some of the discomfort in her shoulder based on her recent EMGs and nerve conduction studies he did have some referred pain and there is evidence of a C5-6 radiculopathy.  There is not significant pathology in the left shoulder based on her prior MRI scan but she certainly has significant loss of motion with adhesive capsulitis.  I think  that has to be resolved before we consider any further diagnostic or treatment for the left shoulder  Follow-Up Instructions: Return after MRI c-spine.   Orders:  Orders Placed This Encounter  Procedures  . MR Cervical Spine w/o contrast   No orders of the defined types were placed in this encounter.     Procedures: No procedures performed   Clinical Data: No additional findings.   Subjective: Chief Complaint  Patient presents with  . Left Shoulder - Pain    Was told she has torn tendon, and frozen shoulder, and she has EMG done in [place] and it shows nerve problems.  Had surgery for Lt CTR and back surgery. Onset 12/2017.   Mrs. Kathy Hodge is 47 years old and visits the office for evaluation of a problem referable to her left upper extremity.  Her history is somewhat complicated and that she just recently moved to [place] from the [place] region.  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  Left was greater than right.  She also has developed significant pain in her left shoulder.  She has had several cortisone injections and a course of physical therapy.  She also had an MRI scan performed in August 2019 demonstrating supra and infraspinatus tendinosis with partial-thickness undersurface tearing of the distal rotator cuff.  No muscle atrophy.  There was a possibility of tear of the superior labral margin.  She had a follow-up EMG and nerve conduction study in October 2019 demonstrating a left C5-6 radiculopathy.  Mrs. Kathy Hodge complained of pain in her left shoulder during the needle EMG.  It also showed residual bilateral demyelinating and axonal neuropathy of the wrist consistent with her diagnosis of carpal tunnel syndrome an MRI scan was performed of the cervical spine in July 2018 without evidence of cervical disc herniation, spinal cord abnormalities or bone pathology.  All studies were performed in [place] Presently she is having trouble with her neck and with her left shoulder.  She wears a splint for the carpal tunnel release.  She is not working.  She has considerable difficulty trying to raise her left arm over her head despite the injections and PT.  HPI  Review of Systems   Objective: Vital Signs: BP 140/87 (BP Location: Right Arm, Patient Position: Sitting)   Pulse 71   Ht 5\' 10"  (1.778 m)   Wt 230 lb (104.3 kg)   BMI 33.00 kg/m   Physical Exam Constitutional:      Appearance: She is well-developed.  Eyes:     Pupils: Pupils are equal, round, and reactive to light.  Pulmonary:     Effort: Pulmonary effort is normal.  Skin:    General: Skin is warm and dry.  Neurological:      Mental Status: She is alert and oriented to person, place, and time.  Psychiatric:        Behavior: Behavior normal.      Ortho Exam awake alert and oriented x3.  Comfortable sitting.  Difficulty with motion of the cervical spine related to pain.  She could just barely touch her chin to her chest.  Had about 70% of normal neck extension.  Was able to rotate to the right and to the left without much trouble and notes specifically referred pain to her left shoulder.  She has significant loss of motion of the left shoulder with motion with only about 90 degrees of flexion and abduction some areas of tenderness about the anterior shoulder.  Skin intact.  Neurovascular exam intact.  I did not evaluate her left carpal tunnel as she is recently had surgery and wears a splint.  Specialty Comments:  No specialty comments available.  Imaging: No results found.   PMFS History: Patient Active Problem List   Diagnosis Date Noted  . Other chronic pain 06/23/2018  . Essential hypertension 06/23/2018  . S/P spinal fusion 06/23/2018  . S/P carpal tunnel release Left  06/23/2018  . Disorder of left rotator cuff 06/23/2018  . OSA on CPAP 06/23/2018  . Carpal tunnel syndrome of right wrist 06/23/2018  . Intermittent asthma without complication 68/07/5725   Past Medical History:  Diagnosis Date  . Asthma   . Hypertension   . Sleep apnea     Family History  Problem Relation Age of Onset  . COPD Mother   . Diabetes Mother   . Bone cancer Father     Past Surgical History:  Procedure Laterality Date  . ABDOMINAL HYSTERECTOMY    . BACK SURGERY    . CESAREAN SECTION    . HAND TENDON SURGERY Left 04/21/2018   Social History   Occupational History  . Not on file  Tobacco Use  . Smoking status: Former Research scientist (life sciences)  . Smokeless tobacco: Never Used  Substance and Sexual Activity  . Alcohol use: Not Currently  . Drug use: Not on file  . Sexual activity: Yes     Garald Balding,  MD   Note - This record has been created using [employer].  Chart creation errors have been sought, but may not always  have been located. Such creation errors do not reflect on  the standard of medical care.

## 2018-08-18 ENCOUNTER — Ambulatory Visit (INDEPENDENT_AMBULATORY_CARE_PROVIDER_SITE_OTHER): Payer: Medicaid Other | Admitting: Family Medicine

## 2018-08-18 ENCOUNTER — Encounter: Payer: Self-pay | Admitting: Family Medicine

## 2018-08-18 VITALS — BP 119/87 | HR 77 | Temp 98.9°F | Resp 17 | Ht 70.0 in | Wt 234.2 lb

## 2018-08-18 DIAGNOSIS — R350 Frequency of micturition: Secondary | ICD-10-CM

## 2018-08-18 DIAGNOSIS — Z01419 Encounter for gynecological examination (general) (routine) without abnormal findings: Secondary | ICD-10-CM | POA: Diagnosis not present

## 2018-08-18 DIAGNOSIS — R94130 Abnormal response to nerve stimulation, unspecified: Secondary | ICD-10-CM

## 2018-08-18 DIAGNOSIS — A084 Viral intestinal infection, unspecified: Secondary | ICD-10-CM | POA: Diagnosis not present

## 2018-08-18 DIAGNOSIS — R202 Paresthesia of skin: Secondary | ICD-10-CM

## 2018-08-18 DIAGNOSIS — G5603 Carpal tunnel syndrome, bilateral upper limbs: Secondary | ICD-10-CM

## 2018-08-18 LAB — POCT URINALYSIS DIP (CLINITEK)
Blood, UA: NEGATIVE
Glucose, UA: NEGATIVE mg/dL
Leukocytes, UA: NEGATIVE
NITRITE UA: NEGATIVE
POC PROTEIN,UA: NEGATIVE
Spec Grav, UA: 1.025 (ref 1.010–1.025)
UROBILINOGEN UA: 1 U/dL
pH, UA: 6 (ref 5.0–8.0)

## 2018-08-18 MED ORDER — DICYCLOMINE HCL 10 MG PO CAPS: 10.0000 mg | ORAL_CAPSULE | Freq: Three times a day (TID) | ORAL | 0 refills | Status: DC

## 2018-08-18 MED ORDER — ONDANSETRON 4 MG PO TBDP: 4.0000 mg | ORAL_TABLET | Freq: Three times a day (TID) | ORAL | 0 refills | Status: DC | PRN

## 2018-08-18 MED ORDER — TIZANIDINE HCL 4 MG PO CAPS: 4.0000 mg | ORAL_CAPSULE | Freq: Three times a day (TID) | ORAL | 0 refills | Status: DC

## 2018-08-18 MED ORDER — FAMOTIDINE 20 MG PO TABS: 20.0000 mg | ORAL_TABLET | Freq: Two times a day (BID) | ORAL | 1 refills | Status: DC

## 2018-08-18 NOTE — Progress Notes (Signed)
Acute Office Visit  Subjective:    Patient ID: Kathy Hodge, female    DOB: [birth year], 47 y.o.   MRN: [id number]  Chief Complaint  Patient presents with  . Diarrhea    stomach pain, nausea, diarrhea x 1 day    HPI Diarrhea  Concern for food poison. Reports two days of nausea, abdominal cramping, and diarrhea. Granddaughter has been sick with similar symptoms. She has continued to hydrate, although has continued to remain intolerable of food. She has not taken any over the counter medication. Denies fever, weakness, hemoptysis, or bloody stool.  Chronic pain, Carpal Tunnel Syndrome  Patient is requesting a refill on Zanaflex.  Medication was filled by another provider. Patient suffers from polypharmacy and was recently accepted into pain management. She is prescribed multiple sedative medications and Ambien for sleep. She is reports that he is not taking Gabapentin as Zanaflex works better with carpal tunnel and back pain. She recently established with orthopedics and was advised to request a referral to neurology as she has had a prior abnormal nerve conduction study and recommendation was made by a prior provider to have studies repeated.   Past Medical History:  Diagnosis Date  . Asthma   . Hypertension   . Sleep apnea     Past Surgical History:  Procedure Laterality Date  . ABDOMINAL HYSTERECTOMY    . BACK SURGERY    . CESAREAN SECTION    . HAND TENDON SURGERY Left 04/21/2018    Family History  Problem Relation Age of Onset  . COPD Mother   . Diabetes Mother   . Bone cancer Father     Social History   Socioeconomic History  . Marital status: Single    Spouse name: Not on file  . Number of children: Not on file  . Years of education: Not on file  . Highest education level: Not on file  Occupational History  . Not on file  Social Needs  . Financial resource strain: Not on file  . Food insecurity:    Worry: Not on file    Inability: Not on file  .  Transportation needs:    Medical: Not on file    Non-medical: Not on file  Tobacco Use  . Smoking status: Former Research scientist (life sciences)  . Smokeless tobacco: Never Used  Substance and Sexual Activity  . Alcohol use: Not Currently  . Drug use: Not on file  . Sexual activity: Yes  Lifestyle  . Physical activity:    Days per week: Not on file    Minutes per session: Not on file  . Stress: Not on file  Relationships  . Social connections:    Talks on phone: Not on file    Gets together: Not on file    Attends religious service: Not on file    Active member of club or organization: Not on file    Attends meetings of clubs or organizations: Not on file    Relationship status: Not on file  . Intimate partner violence:    Fear of current or ex partner: Not on file    Emotionally abused: Not on file    Physically abused: Not on file    Forced sexual activity: Not on file  Other Topics Concern  . Not on file  Social History Narrative  . Not on file    Outpatient Medications Prior to Visit  Medication Sig Dispense Refill  . albuterol (PROVENTIL HFA;VENTOLIN HFA) 108 (90 Base) MCG/ACT inhaler Inhale 1-2  puffs into the lungs every 6 (six) hours as needed for wheezing or shortness of breath.    Marland Kitchen albuterol (PROVENTIL) (2.5 MG/3ML) 0.083% nebulizer solution Take 2.5 mg by nebulization every 6 (six) hours as needed for wheezing or shortness of breath.    Marland Kitchen amLODipine (NORVASC) 5 MG tablet Take 5 mg by mouth daily.    . fexofenadine-pseudoephedrine (ALLEGRA-D) 60-120 MG 12 hr tablet Take 1 tablet by mouth 2 (two) times daily.    . Fluticasone-Salmeterol 113-14 MCG/ACT AEPB Inhale 1 puff into the lungs 2 (two) times daily.    Marland Kitchen gabapentin (NEURONTIN) 600 MG tablet Take 600 mg by mouth 3 (three) times daily.    . hydrochlorothiazide (MICROZIDE) 12.5 MG capsule Take 12.5 mg by mouth daily.    . montelukast (SINGULAIR) 10 MG tablet Take 10 mg by mouth at bedtime.    . topiramate (TOPAMAX) 25 MG tablet Take 25  mg by mouth 2 (two) times daily.    . traMADol (ULTRAM) 50 MG tablet Take 1 tablet (50 mg total) by mouth every 8 (eight) hours as needed. 90 tablet 0  . zolpidem (AMBIEN) 10 MG tablet Take 1 tablet (10 mg total) by mouth at bedtime as needed for sleep. 30 tablet 3  . zolpidem (AMBIEN) 5 MG tablet Take 1 tablet (5 mg total) by mouth at bedtime as needed for sleep. 30 tablet 1   No facility-administered medications prior to visit.     Allergies  Allergen Reactions  . Peanut-Containing Drug Products Anaphylaxis    ROS Pertinent negatives listed in HPI    Objective:    Physical Exam  BP 119/87   Pulse 77   Temp 98.9 F (37.2 C) (Oral)   Resp 17   Ht 5\' 10"  (1.778 m)   Wt 234 lb 3.2 oz (106.2 kg)   SpO2 97%   BMI 33.60 kg/m  Wt Readings from Last 3 Encounters:  08/18/18 234 lb 3.2 oz (106.2 kg)  08/14/18 230 lb (104.3 kg)  08/11/18 236 lb (107 kg)    Health Maintenance Due  Topic Date Due  . HIV Screening  03/29/1985  . TETANUS/TDAP  03/29/1989        Assessment & Plan:   Problem List Items Addressed This Visit    None    Visit Diagnoses    Urine frequency    -  Primary, negative for UTI   Relevant Orders   POCT URINALYSIS DIP (CLINITEK) (Completed)   Viral gastroenteritis     Treat symptomatically for now. If symptoms persist will obtain a GI panel.   Abnormal nerve conduction studies       Relevant Orders   Ambulatory referral to Neurology   Well woman exam, requests a well women exam prefers OBGYN    needs referral for OBGYN only    Bilateral carpal tunnel syndrome       Relevant Medications   tiZANidine (ZANAFLEX) 4 MG capsule   Other Relevant Orders   Ambulatory referral to Neurology   Paresthesia       Relevant Orders   Ambulatory referral to Neurology       Meds ordered this encounter  Medications  . dicyclomine (BENTYL) 10 MG capsule    Sig: Take 1 capsule (10 mg total) by mouth 4 (four) times daily -  before meals and at bedtime for 7  days.    Dispense:  28 capsule    Refill:  0  . famotidine (PEPCID) 20 MG tablet  Sig: Take 1 tablet (20 mg total) by mouth 2 (two) times daily.    Dispense:  30 tablet    Refill:  1  . ondansetron (ZOFRAN ODT) 4 MG disintegrating tablet    Sig: Take 1 tablet (4 mg total) by mouth every 8 (eight) hours as needed for nausea or vomiting.    Dispense:  30 tablet    Refill:  0  . tiZANidine (ZANAFLEX) 4 MG capsule    Sig: Take 1 capsule (4 mg total) by mouth 3 (three) times daily.    Dispense:  30 capsule, advised pain management can continue if decided     Refill:  0     Molli Barrows, FNP-C [facility] [address], [place] [phone]: [phone]

## 2018-08-18 NOTE — Patient Instructions (Signed)

## 2018-08-21 ENCOUNTER — Telehealth (INDEPENDENT_AMBULATORY_CARE_PROVIDER_SITE_OTHER): Payer: Self-pay | Admitting: Orthopaedic Surgery

## 2018-08-21 NOTE — Telephone Encounter (Signed)
Patient states rx for pain medication was sent into [facility] on [address]. Per patient pharmacy states rx needs prior auth for Medicaid. Please call to advise. Patient will be out of medication this weekend.

## 2018-08-21 NOTE — Telephone Encounter (Signed)
Dr. Durward Fortes did not prescribe patient medication.

## 2018-08-28 ENCOUNTER — Ambulatory Visit
Admission: RE | Admit: 2018-08-28 | Discharge: 2018-08-28 | Disposition: A | Discharge status: Home / Self Care | Payer: Medicaid Other | Source: Ambulatory Visit | Attending: Orthopaedic Surgery | Admitting: Orthopaedic Surgery

## 2018-08-28 DIAGNOSIS — M5030 Other cervical disc degeneration, unspecified cervical region: Secondary | ICD-10-CM

## 2018-09-02 DIAGNOSIS — M7500 Adhesive capsulitis of unspecified shoulder: Secondary | ICD-10-CM

## 2018-09-02 HISTORY — DX: Adhesive capsulitis of unspecified shoulder: M75.00

## 2018-09-03 ENCOUNTER — Encounter (INDEPENDENT_AMBULATORY_CARE_PROVIDER_SITE_OTHER): Payer: Self-pay | Admitting: Orthopaedic Surgery

## 2018-09-03 ENCOUNTER — Ambulatory Visit (INDEPENDENT_AMBULATORY_CARE_PROVIDER_SITE_OTHER): Payer: Medicaid Other | Admitting: Orthopaedic Surgery

## 2018-09-03 VITALS — BP 121/83 | HR 100 | Ht 70.0 in | Wt 240.0 lb

## 2018-09-03 DIAGNOSIS — M25512 Pain in left shoulder: Secondary | ICD-10-CM

## 2018-09-03 DIAGNOSIS — G8929 Other chronic pain: Secondary | ICD-10-CM

## 2018-09-03 DIAGNOSIS — M542 Cervicalgia: Secondary | ICD-10-CM

## 2018-09-03 NOTE — Progress Notes (Deleted)
Lt shoulder--pt stated still painful and numbness.   Office Visit Note   Patient: Kathy Hodge           Date of Birth: 1971-06-12           MRN: [id number] Visit Date: 09/03/2018              Requested by: Scot Jun, [address] [place], [place] [address] PCP: Scot Jun, FNP   Assessment & Plan: Visit Diagnoses: No diagnosis found.  Plan: ***  Follow-Up Instructions: No follow-ups on file.   Orders:  No orders of the defined types were placed in this encounter.  No orders of the defined types were placed in this encounter.     Procedures: No procedures performed   Clinical Data: No additional findings.   Subjective: No chief complaint on file.   HPI  Review of Systems  Constitutional: Negative.   HENT: Negative.   Eyes: Negative.   Respiratory: Negative.   Cardiovascular: Negative.   Gastrointestinal: Negative.   Endocrine: Negative.   Genitourinary: Negative.   Musculoskeletal: Positive for back pain.  Allergic/Immunologic: Negative.   Neurological: Positive for headaches.  Hematological: Negative.   Psychiatric/Behavioral: Negative.      Objective: Vital Signs: Ht 5\' 10"  (1.778 m)   Wt 240 lb (108.9 kg)   BMI 34.44 kg/m   Physical Exam  Ortho Exam  Specialty Comments:  No specialty comments available.  Imaging: No results found.   PMFS History: Patient Active Problem List   Diagnosis Date Noted  . Other chronic pain 06/23/2018  . Essential hypertension 06/23/2018  . S/P spinal fusion 06/23/2018  . S/P carpal tunnel release Left  06/23/2018  . Disorder of left rotator cuff 06/23/2018  . OSA on CPAP 06/23/2018  . Carpal tunnel syndrome of right wrist 06/23/2018  . Intermittent asthma without complication 34/28/7681   Past Medical History:  Diagnosis Date  . Asthma   . Hypertension   . Sleep apnea     Family History  Problem Relation Age of Onset  . COPD Mother   . Diabetes Mother   . Bone  cancer Father     Past Surgical History:  Procedure Laterality Date  . ABDOMINAL HYSTERECTOMY    . BACK SURGERY    . CESAREAN SECTION    . HAND TENDON SURGERY Left 04/21/2018   Social History   Occupational History  . Not on file  Tobacco Use  . Smoking status: Former Research scientist (life sciences)  . Smokeless tobacco: Never Used  Substance and Sexual Activity  . Alcohol use: Not Currently  . Drug use: Not on file  . Sexual activity: Yes

## 2018-09-03 NOTE — Progress Notes (Signed)
Office Visit Note   Patient: Kathy Hodge           Date of Birth: 08-03-1971           MRN: [id number] Visit Date: 09/03/2018              Requested by: Scot Jun, [address] [place], [place] [address] PCP: Scot Jun, FNP   Assessment & Plan: Visit Diagnoses:  1. Chronic left shoulder pain   2. Cervicalgia     Plan: MRI scan of the cervical spine reveals straightening of the normal lordosis but no acute osseous abnormalities.  There is minimal cervical disc degeneration at C3-4 and C4-5 with a wide open canal.  There was evidence of a partially empty sella which can be associated with idiopathic intracranial hypertension.Kathy Hodge is aware of that finding having been evaluated in [place] where she was living.  She also has  a follow-up an appointment with a neurologist here in [place] in February. At the very least she has adhesive capsulitis.  I think at this point it is worth manipulating the shoulder under anesthesia and then setting up physical therapy for both the cervical spine and shoulder. Long Discussion regarding the above and she would like to proceed  Follow-Up Instructions: Return will schedule manipulation left shoulder.   Orders:  No orders of the defined types were placed in this encounter.  No orders of the defined types were placed in this encounter.     Procedures: No procedures performed   Clinical Data: No additional findings.   Subjective: Chief Complaint  Patient presents with  . Left Shoulder - Follow-up  Kathy Hodge has a chronic problem with her's cervical spine and left upper extremity as previously outlined.  Majority of her evaluation has been performed in [place] where she was previously living.  She has had a chronic problem with her left shoulder with limited mobility without prior shoulder surgery.  Has evidence of adhesive capsulitis.  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  I ordered an MRI scan which demonstrates some mild degenerative changes with central disc protrusion the same at C4 V nerve compression.  She also has evidence empty sella.  She is aware of this diagnosis having been diagnosed with the same in [place].  She has a follow-up appointment in February to see a neurologist in [place].  No apparent symptoms.  Recently had an MRI scan of her left shoulder demonstrating some rotator cuff tendinitis and possibly small tear of the superior labrum  HPI  Review of Systems   Objective: Vital Signs: BP 121/83   Pulse 100   Ht 5\' 10"  (1.778 m)   Wt 240 lb (108.9 kg)   BMI 34.44 kg/m   Physical Exam Constitutional:      Appearance: She is well-developed.  Eyes:     Pupils: Pupils are equal, round, and reactive to light.  Pulmonary:     Effort: Pulmonary effort is normal.  Skin:    General: Skin is warm and dry.  Neurological:     Mental Status: She is alert and oriented to person, place, and time.  Psychiatric:        Behavior: Behavior normal.     Ortho Exam awake alert and oriented x3.  Comfortable sitting.  Able to touch chin to her chest with slow motion.  Lacks about 40 to 45  degrees of full neck extension with some neck discomfort.  Has about 60 to 70% of normal rotation to the right and to the left.  Rotation to the right did create some discomfort along the left shoulder.  Significant limitation of motion left shoulder with localized areas of tenderness.  About 70 degrees of abduction and about 90 degrees of flexion consistent with a diagnosis of adhesive capsulitis.  No distal edema.  Specialty Comments:  No specialty comments available.  Imaging: No results found.   PMFS History: Patient Active Problem List   Diagnosis Date Noted  . Cervicalgia 09/03/2018  . Chronic left shoulder pain 06/23/2018  . Essential hypertension 06/23/2018  . S/P spinal fusion  06/23/2018  . S/P carpal tunnel release Left  06/23/2018  . Disorder of left rotator cuff 06/23/2018  . OSA on CPAP 06/23/2018  . Carpal tunnel syndrome of right wrist 06/23/2018  . Intermittent asthma without complication 58/85/0277   Past Medical History:  Diagnosis Date  . Asthma   . Hypertension   . Sleep apnea     Family History  Problem Relation Age of Onset  . COPD Mother   . Diabetes Mother   . Bone cancer Father     Past Surgical History:  Procedure Laterality Date  . ABDOMINAL HYSTERECTOMY    . BACK SURGERY    . CESAREAN SECTION    . HAND TENDON SURGERY Left 04/21/2018   Social History   Occupational History  . Not on file  Tobacco Use  . Smoking status: Former Research scientist (life sciences)  . Smokeless tobacco: Never Used  Substance and Sexual Activity  . Alcohol use: Not Currently  . Drug use: Not on file  . Sexual activity: Yes     Garald Balding, MD   Note - This record has been created using [employer].  Chart creation errors have been sought, but may not always  have been located. Such creation errors do not reflect on  the standard of medical care.

## 2018-09-07 ENCOUNTER — Telehealth: Payer: Self-pay | Admitting: Family Medicine

## 2018-09-07 MED ORDER — TOPIRAMATE 25 MG PO TABS: 25.0000 mg | ORAL_TABLET | Freq: Two times a day (BID) | ORAL | 4 refills | Status: DC

## 2018-09-07 NOTE — Telephone Encounter (Signed)
Please advise 

## 2018-09-07 NOTE — Telephone Encounter (Signed)
Medication refilled sent to pharmacy.

## 2018-09-07 NOTE — Telephone Encounter (Signed)
Patient called requesting topiramate (TOPAMAX) 25 MG tablet [[id number]] Patient states she was on this medication in [place] and is requesting some now for her migraines, please follow up.

## 2018-09-10 ENCOUNTER — Other Ambulatory Visit (INDEPENDENT_AMBULATORY_CARE_PROVIDER_SITE_OTHER): Payer: Self-pay | Admitting: Orthopaedic Surgery

## 2018-09-10 ENCOUNTER — Ambulatory Visit (INDEPENDENT_AMBULATORY_CARE_PROVIDER_SITE_OTHER): Payer: Medicaid Other | Admitting: Orthopaedic Surgery

## 2018-09-10 DIAGNOSIS — M25512 Pain in left shoulder: Principal | ICD-10-CM

## 2018-09-10 DIAGNOSIS — M7502 Adhesive capsulitis of left shoulder: Secondary | ICD-10-CM

## 2018-09-10 DIAGNOSIS — G8929 Other chronic pain: Secondary | ICD-10-CM

## 2018-09-14 ENCOUNTER — Ambulatory Visit: Payer: Medicaid Other | Attending: Orthopaedic Surgery | Admitting: Physical Therapy

## 2018-09-14 ENCOUNTER — Other Ambulatory Visit: Payer: Self-pay

## 2018-09-14 DIAGNOSIS — G8929 Other chronic pain: Secondary | ICD-10-CM | POA: Insufficient documentation

## 2018-09-14 DIAGNOSIS — M7502 Adhesive capsulitis of left shoulder: Secondary | ICD-10-CM | POA: Diagnosis present

## 2018-09-14 DIAGNOSIS — R293 Abnormal posture: Secondary | ICD-10-CM | POA: Diagnosis present

## 2018-09-14 DIAGNOSIS — R209 Unspecified disturbances of skin sensation: Secondary | ICD-10-CM | POA: Insufficient documentation

## 2018-09-14 DIAGNOSIS — M25512 Pain in left shoulder: Secondary | ICD-10-CM | POA: Diagnosis present

## 2018-09-14 DIAGNOSIS — M6281 Muscle weakness (generalized): Secondary | ICD-10-CM | POA: Diagnosis present
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
## 2018-09-14 NOTE — Patient Instructions (Signed)
Prepared By: [place], [place]  Phone: [phone]  Step 1  Step 2  Seated Shoulder Flexion Towel Slide at Table Top reps: 10  sets: 2  hold: 10  daily: 2  weekly: 7 Setup  Begin sitting facing a table or counter top with your hand resting flat on a towel. Movement  Slowly lean forward to slide your hand and towel across the table. Return to the starting position and repeat. Tip  Try to avoid shrugging your shoulder during the exercise and make sure your hand stays on the table. Step 1  Step 2  Seated Shoulder Abduction Towel Slide at Table Top reps: 10  sets: 2  hold: 10  daily: 2  weekly: 7 Setup  Begin sitting to the side of a table with your hand resting flat on a towel. Movement  Slowly bend sideways, pushing the towel out to the side across the table. Return to the starting position and repeat. Tip  Try to avoid shrugging your shoulder during the exercise and keep your movements slow and pain-free. Step 1  Step 2  Circular Shoulder Pendulum with Table Support reps: 3  sets: 2  hold: 30  daily: 2  weekly: 7 Setup  Begin in a standing position with your trunk bent forward, one arm resting on a table for support and your other arm hanging toward the ground.  Movement  Slowly shift your body weight in a circular motion, letting your hanging arm swing in a circle at the same time. Tip  Make sure the movement comes from your body shifting and do not use your arm muscles to create the circular motion.

## 2018-09-16 ENCOUNTER — Telehealth (INDEPENDENT_AMBULATORY_CARE_PROVIDER_SITE_OTHER): Payer: Self-pay | Admitting: Orthopaedic Surgery

## 2018-09-16 NOTE — Telephone Encounter (Signed)
Patient left a voicemail to let Dr. Durward Fortes know that she is having a lot of shoulder pain.  Patient states she has appointment tomorrow.

## 2018-09-16 NOTE — Telephone Encounter (Signed)
Dr. Durward Fortes is aware, will see patient in office 09/17/2018

## 2018-09-16 NOTE — Telephone Encounter (Signed)
I called patient 

## 2018-09-17 ENCOUNTER — Encounter (INDEPENDENT_AMBULATORY_CARE_PROVIDER_SITE_OTHER): Payer: Self-pay | Admitting: Orthopaedic Surgery

## 2018-09-17 ENCOUNTER — Ambulatory Visit (INDEPENDENT_AMBULATORY_CARE_PROVIDER_SITE_OTHER): Payer: Medicaid Other | Admitting: Orthopaedic Surgery

## 2018-09-17 ENCOUNTER — Ambulatory Visit (INDEPENDENT_AMBULATORY_CARE_PROVIDER_SITE_OTHER): Payer: Medicaid Other

## 2018-09-17 VITALS — BP 121/74 | HR 63 | Resp 13 | Ht 70.0 in | Wt 238.0 lb

## 2018-09-17 DIAGNOSIS — M25512 Pain in left shoulder: Secondary | ICD-10-CM | POA: Diagnosis not present

## 2018-09-17 DIAGNOSIS — G8929 Other chronic pain: Secondary | ICD-10-CM

## 2018-09-17 MED ORDER — METHOCARBAMOL 500 MG PO TABS: 500.0000 mg | ORAL_TABLET | Freq: Four times a day (QID) | ORAL | 1 refills | Status: DC | PRN

## 2018-09-17 MED ORDER — PREDNISONE 5 MG (21) PO TBPK: ORAL_TABLET | ORAL | 0 refills | Status: DC

## 2018-09-17 NOTE — Progress Notes (Signed)
Office Visit Note   Patient: Kathy Hodge           Date of Birth: [birth year]           MRN: [id number] Visit Date: 09/17/2018              Requested by: Scot Jun, [address] [place], [place] [address] PCP: Scot Jun, FNP   Assessment & Plan: Visit Diagnoses:  1. Chronic left shoulder pain     Plan: Recent closed manipulation of left shoulder for adhesive capsulitis with persistent pain.  Films were negative for any fracture today.  Has been followed at the pain clinic and will defer to them for any opioid medicines.  Will start a Medrol Dosepak and methocarbamol.  Continue with physical therapy as I am concerned that she will have recurrent adhesive capsulitis.  I do not think there has been any problem with the manipulation other than very painful response.  Hopefully over time and appropriate medicines that she will improve.  Follow-Up Instructions: Return in about 2 weeks (around 10/01/2018).   Orders:  Orders Placed This Encounter  Procedures  . XR Shoulder Left   Meds ordered this encounter  Medications  . methocarbamol (ROBAXIN) 500 MG tablet    Sig: Take 1 tablet (500 mg total) by mouth every 6 (six) hours as needed for muscle spasms.    Dispense:  30 tablet    Refill:  1  . predniSONE (STERAPRED UNI-PAK 21 TAB) 5 MG (21) TBPK tablet    Sig: Take as directed    Dispense:  21 tablet    Refill:  0      Procedures: No procedures performed   Clinical Data: No additional findings.   Subjective: Chief Complaint  Patient presents with  . Left Shoulder - Routine Post Op   Kathy Hodge presents in the office today for post op on left shoulder. She is one week post op and rates pain at a 10. Patient states she is having trouble sleeping at night due to the pain. Patient states she  is taking oxycodone 4 times daily.  Has been followed in a pain clinic and has an appointment tomorrow  HPI  Review of Systems  Constitutional: Positive  for fatigue. Negative for fever.  HENT: Negative for sore throat and tinnitus.   Eyes: Negative for pain and redness.  Respiratory: Negative for shortness of breath and wheezing.   Cardiovascular: Negative for chest pain and leg swelling.  Gastrointestinal: Negative for abdominal pain, blood in stool and constipation.  Endocrine: Negative for polyuria.  Genitourinary: Negative for hematuria and pelvic pain.  Musculoskeletal: Negative for gait problem.  Skin: Negative for rash.  Allergic/Immunologic: Negative for immunocompromised state.  Neurological: Positive for dizziness and headaches. Negative for weakness.  Hematological: Does not bruise/bleed easily.  Psychiatric/Behavioral: Negative for confusion. The patient is not nervous/anxious.      Objective: Vital Signs: BP 121/74 (BP Location: Right Arm, Patient Position: Sitting, Cuff Size: Normal)   Pulse 63   Resp 13   Ht 5\' 10"  (1.778 m)   Wt 238 lb (108 kg)   BMI 34.15 kg/m   Physical Exam  Ortho Exam multiple areas of localized tenderness about the left shoulder.  No erythema.  No ecchymosis.  Painful motion I could abduct passively about 90 degrees and flex about the same with considerable pain.  Good grip and good release.  Specialty Comments:  No specialty comments available.  Imaging:  Xr Shoulder Left  Result Date: 09/17/2018 As of the left shoulder obtained specifically to rule out a fracture after manipulation.  No evidence of fracture about the humeral head or the glenoid.  No ectopic calcification.  Shoulder joint is centered    PMFS History: Patient Active Problem List   Diagnosis Date Noted  . Cervicalgia 09/03/2018  . Chronic left shoulder pain 06/23/2018  . Essential hypertension 06/23/2018  . S/P spinal fusion 06/23/2018  . S/P carpal tunnel release Left  06/23/2018  . Disorder of left rotator cuff 06/23/2018  . OSA on CPAP 06/23/2018  . Carpal tunnel syndrome of right wrist 06/23/2018  .  Intermittent asthma without complication 67/[birth year]1937   Past Medical History:  Diagnosis Date  . Asthma   . Hypertension   . Sleep apnea     Family History  Problem Relation Age of Onset  . COPD Mother   . Diabetes Mother   . Bone cancer Father     Past Surgical History:  Procedure Laterality Date  . ABDOMINAL HYSTERECTOMY    . BACK SURGERY    . CESAREAN SECTION    . HAND TENDON SURGERY Left 04/21/2018   Social History   Occupational History  . Not on file  Tobacco Use  . Smoking status: Former Research scientist (life sciences)  . Smokeless tobacco: Never Used  Substance and Sexual Activity  . Alcohol use: Not Currently  . Drug use: Not on file  . Sexual activity: Yes

## 2018-09-18 ENCOUNTER — Inpatient Hospital Stay (INDEPENDENT_AMBULATORY_CARE_PROVIDER_SITE_OTHER): Payer: Medicaid Other | Admitting: Orthopaedic Surgery

## 2018-09-22 ENCOUNTER — Encounter: Payer: Self-pay | Admitting: Physical Therapy

## 2018-09-22 ENCOUNTER — Ambulatory Visit: Payer: Medicaid Other | Admitting: Physical Therapy

## 2018-09-22 DIAGNOSIS — M25512 Pain in left shoulder: Secondary | ICD-10-CM

## 2018-09-22 DIAGNOSIS — M7502 Adhesive capsulitis of left shoulder: Secondary | ICD-10-CM

## 2018-09-22 DIAGNOSIS — M6281 Muscle weakness (generalized): Secondary | ICD-10-CM

## 2018-09-22 DIAGNOSIS — G8929 Other chronic pain: Secondary | ICD-10-CM

## 2018-09-22 DIAGNOSIS — R293 Abnormal posture: Secondary | ICD-10-CM

## 2018-09-22 DIAGNOSIS — R209 Unspecified disturbances of skin sensation: Secondary | ICD-10-CM

## 2018-09-22 NOTE — Patient Instructions (Signed)
TENS UNIT: This is helpful for muscle pain and spasm.   Search and Purchase a TENS 7000 2nd edition at www.tenspros.com. or Kathy Hodge It should be less than $30.     TENS unit instructions: Do not shower or bathe with the unit on Turn the unit off before removing electrodes or batteries If the electrodes lose stickiness add a drop of water to the electrodes after they are disconnected from the unit and place on plastic sheet. If you continued to have difficulty, call the TENS unit company to purchase more electrodes. Do not apply lotion on the skin area prior to use. Make sure the skin is clean and dry as this will help prolong the life of the electrodes. After use, always check skin for unusual red areas, rash or other skin difficulties. If there are any skin problems, does not apply electrodes to the same area. Never remove the electrodes from the unit by pulling the wires. Do not use the TENS unit or electrodes other than as directed. Do not change electrode placement without consultating your therapist or physician. Keep 2 fingers with between each electrode. Wear time ratio is 2:1, on to off times.    For example on for 30 minutes off for 15 minutes and then on for 30 minutes off for 15 minutes

## 2018-09-22 NOTE — Therapy (Signed)
[place], [place], [address] Phone: [phone]   Fax:  [phone]  Physical Therapy Treatment  Patient Details  Name: Kathy Hodge MRN: [id number] Date of Birth: 1971/05/28 Referring Provider (PT): Dr. Durward Fortes    Encounter Date: 09/22/2018  PT End of Session - 09/22/18 1022    Visit Number  2    Number of Visits  4    Date for PT Re-Evaluation  10/05/18    PT Start Time  1022    PT Stop Time  1117    PT Time Calculation (min)  55 min    Activity Tolerance  Patient limited by pain       Past Medical History:  Diagnosis Date  . Asthma   . Hypertension   . Sleep apnea     Past Surgical History:  Procedure Laterality Date  . ABDOMINAL HYSTERECTOMY    . BACK SURGERY    . CESAREAN SECTION    . HAND TENDON SURGERY Left 04/21/2018    There were no vitals filed for this visit.  Subjective Assessment - 09/22/18 1022    Subjective  Pt reports she had carpal tunnel surgery 4 months ago and the hand/wrist still bother her. She forgot her brace today.  Saw MD, xrays of shoulder were (-) for fracture so she is good to continue with PT     Patient Stated Goals  To be able to use my arm and do my granddaughter's hair     Currently in Pain?  Yes    Pain Score  5     Pain Location  Shoulder    Pain Orientation  Left    Pain Descriptors / Indicators  Throbbing;Aching;Shooting    Pain Type  Chronic pain    Pain Radiating Towards  to wrist     Pain Onset  More than a month ago    Pain Frequency  Constant    Aggravating Factors   moving the arm     Pain Relieving Factors  medications, ice, biofreeze         [facility] PT Assessment - 09/22/18 0001      Assessment   Medical Diagnosis  L shoulder adhesive capsulitis     Referring Provider (PT)  Dr. Durward Fortes       PROM   Left Shoulder Flexion  90 Degrees    Left Shoulder ABduction  95 Degrees    Left Shoulder Internal Rotation  64 Degrees    Left  Shoulder External Rotation  35 Degrees                   [facility] Adult PT Treatment/Exercise - 09/22/18 0001      Exercises   Exercises  Shoulder      Shoulder Exercises: Supine   Other Supine Exercises  10x 5sec shoulder presses      Shoulder Exercises: Seated   Other Seated Exercises  BWD shoulder rolls      Shoulder Exercises: Pulleys   Flexion  2 minutes    Scaption  2 minutes      Shoulder Exercises: Isometric Strengthening   External Rotation  --   10x5 sec seated Lt shoulder      Shoulder Exercises: Stretch   Other Shoulder Stretches  --      Modalities   Modalities  Electrical Stimulation;Cryotherapy      Cryotherapy   Number Minutes Cryotherapy  15 Minutes    Cryotherapy Location  Shoulder  Type of Cryotherapy  Ice pack      Electrical Stimulation   Electrical Stimulation Location  L shoulder superior lateral aspect     Electrical Stimulation Action  IFC    Electrical Stimulation Parameters   to tolerance    Electrical Stimulation Goals  Pain;Tone      Manual Therapy   Manual Therapy  Passive ROM;Soft tissue mobilization;Joint mobilization    Manual therapy comments  STM into Lt bicep and deltoid to relieve spasma    Joint Mobilization  grade II Lt GH mobsin all directions, limitd d/t pt level of pain    Passive ROM  Lt shoulder in all directions             PT Education - 09/22/18 1036    Education Details  home tens - patients is not working    [employer]) Educated  Patient    Methods  Handout       PT Short Term Goals - 09/14/18 1506      PT SHORT TERM GOAL #1   Title  Pt will be I with HEP for L UE ROM and strength     Baseline  [place], needs cueing     Time  3    Period  Weeks    Status  New    Target Date  10/05/18      PT SHORT TERM GOAL #2   Title  Pt will be able to sit comfortably for meals, rest, pain no more than 5/10.     Baseline  severe, 10/10 today, writhing in pain at times.      Time  3    Period  Weeks    Status  New    Target Date  10/05/18      PT SHORT TERM GOAL #3   Title  Pt will be able to lift arm to 90 deg (despite pain) to work toward ADLs, grooming, normal tasks.     Baseline  75 deg  pain severe pain     Time  3    Period  Weeks    Status  New    Target Date  10/05/18      PT SHORT TERM GOAL #4   Title  Pt will identify pain mgmt strategies and understand pain science to improve functional mobility    Baseline  needs cues, uses ice, TENS unit     Time  3    Period  Weeks    Status  New    Target Date  10/05/18        PT Long Term Goals - 09/14/18 1514      PT LONG TERM GOAL #1   Title  TBA as patient progresses             Plan - 09/22/18 1251    Clinical Impression Statement  Kathy Hodge presents with high level of Lt shoulder pain and hesitancy to perform movements.  She did have some improved PROM in the shoulder from last visit. She has multiple other medical issues that need to be taken into account with her treatments that will affect her healing and make it slower.  Todays focus was mainly manual work to improve shoulder ROM, she was informed to stop UE band work at home and work on range instead.     Rehab Potential  Good    PT Frequency  1x / week    PT Duration  3  weeks    PT Treatment/Interventions  ADLs/Self Care Home Management;Moist Heat;Iontophoresis 4mg /ml Dexamethasone;Electrical Stimulation;Cryotherapy;Ultrasound;Therapeutic exercise;Therapeutic activities;Functional mobility training;Neuromuscular re-education;Patient/family education;Manual techniques;Passive range of motion;Taping    PT Next Visit Plan  shoulder ROM, initial scap stab    Consulted and Agree with Plan of Care  Patient       Patient will benefit from skilled therapeutic intervention in order to improve the following deficits and impairments:  Decreased endurance, Decreased mobility, Hypomobility, Increased muscle spasms, Impaired sensation, Improper  body mechanics, Decreased scar mobility, Decreased range of motion, Decreased activity tolerance, Decreased strength, Impaired flexibility, Postural dysfunction, Pain, Impaired UE functional use, Increased fascial restricitons  Visit Diagnosis: Acute pain of left shoulder  Chronic left shoulder pain  Muscle weakness (generalized)  Abnormal posture  Unspecified disturbances of skin sensation  Adhesive capsulitis of left shoulder     Problem List Patient Active Problem List   Diagnosis Date Noted  . Cervicalgia 09/03/2018  . Chronic left shoulder pain 06/23/2018  . Essential hypertension 06/23/2018  . S/P spinal fusion 06/23/2018  . S/P carpal tunnel release Left  06/23/2018  . Disorder of left rotator cuff 06/23/2018  . OSA on CPAP 06/23/2018  . Carpal tunnel syndrome of right wrist 06/23/2018  . Intermittent asthma without complication 41/66/0630    Jeral Pinch PT 09/22/2018, 12:54 PM  [facility] [address] [place], [place], [address] Phone: [phone]   Fax:  [phone]  Name: Kathy Hodge MRN: [id number] Date of Birth: 12/02/1970

## 2018-09-28 ENCOUNTER — Encounter: Payer: Self-pay | Admitting: Physical Therapy

## 2018-09-28 ENCOUNTER — Ambulatory Visit: Payer: Medicaid Other | Admitting: Physical Therapy

## 2018-09-28 DIAGNOSIS — R209 Unspecified disturbances of skin sensation: Secondary | ICD-10-CM

## 2018-09-28 DIAGNOSIS — M25512 Pain in left shoulder: Secondary | ICD-10-CM

## 2018-09-28 DIAGNOSIS — M7502 Adhesive capsulitis of left shoulder: Secondary | ICD-10-CM

## 2018-09-28 DIAGNOSIS — M6281 Muscle weakness (generalized): Secondary | ICD-10-CM

## 2018-09-28 DIAGNOSIS — R293 Abnormal posture: Secondary | ICD-10-CM

## 2018-09-28 DIAGNOSIS — G8929 Other chronic pain: Secondary | ICD-10-CM

## 2018-09-28 NOTE — Patient Instructions (Signed)
Over Head Pull: Narrow Grip       On back, knees bent, feet flat, band across thighs, elbows straight but relaxed. Pull hands apart (start). Keeping elbows straight, bring arms up and over head, hands toward floor. Keep pull steady on band. Hold momentarily. Return slowly, keeping pull steady, back to start. Repeat __10_ times. Band color __Use yellow band OR a cane to stretch.  Only go as high as you can, stop at shoulder height if you need to.  ____   Side Pull: Double Arm   On back, knees bent, feet flat. Arms perpendicular to body, shoulder level, elbows straight but relaxed. Pull arms out to sides, elbows straight. Resistance band comes across collarbones, hands toward floor. Hold momentarily. Slowly return to starting position. Repeat _10 __ times. Band color _____  Yellow   Shoulder Rotation: Double Arm   On back, knees bent, feet flat, elbows tucked at sides, bent 90, hands palms up. Pull hands apart and down toward floor, keeping elbows near sides. Hold momentarily. Slowly return to starting position. Repeat _10-20__ times. Band color __yellow ____

## 2018-09-28 NOTE — Therapy (Signed)
[place], [place], [address] Phone: [phone]   Fax:  [phone]  Physical Therapy Treatment  Patient Details  Name: Kathy Hodge MRN: [id number] Date of Birth: [birth year] Referring Provider (PT): Dr. Durward Fortes    Encounter Date: 09/28/2018  PT End of Session - 09/28/18 1022    Visit Number  3    Number of Visits  4    Date for PT Re-Evaluation  10/05/18    PT Start Time  1018    PT Stop Time  1120    PT Time Calculation (min)  62 min    Activity Tolerance  Patient limited by pain    Behavior During Therapy  [facility] for tasks assessed/performed       Past Medical History:  Diagnosis Date  . Asthma   . Hypertension   . Sleep apnea     Past Surgical History:  Procedure Laterality Date  . ABDOMINAL HYSTERECTOMY    . BACK SURGERY    . CESAREAN SECTION    . HAND TENDON SURGERY Left 04/21/2018    There were no vitals filed for this visit.  Subjective Assessment - 09/28/18 1020    Subjective  No new changes, maybe a little less pain lately.      Currently in Pain?  Yes   very min at rest    Pain Score  5     Pain Location  Shoulder    Pain Orientation  Left    Pain Type  Chronic pain    Pain Onset  More than a month ago    Pain Frequency  Constant    Aggravating Factors   moving the arm    Pain Relieving Factors  meds, ice           [facility] Adult PT Treatment/Exercise - 09/28/18 0001      Shoulder Exercises: Supine   Horizontal ABduction  Strengthening;Both;5 reps    Theraband Level (Shoulder Horizontal ABduction)  Level 1 (Yellow)    External Rotation  Strengthening;Both;10 reps    Theraband Level (Shoulder External Rotation)  Level 1 (Yellow)    Flexion  AAROM;Strengthening;Both;10 reps    Theraband Level (Shoulder Flexion)  Level 1 (Yellow)    Other Supine Exercises  10x 5sec shoulder presses    Other Supine Exercises  supine scapular stab yellow band       Shoulder Exercises:  Seated   Retraction  Strengthening;Both;10 reps    Horizontal ABduction  AAROM;Left;15 reps    Flexion  AAROM;Left;10 reps    Flexion Weight (lbs)  UE ranger     Abduction  AAROM;Left    ABduction Weight (lbs)  scaption , UE Ranger       Shoulder Exercises: Pulleys   Flexion  3 minutes      Shoulder Exercises: ROM/Strengthening   Pendulum  at countertop circles, ant/post and M/L min cues       Cryotherapy   Number Minutes Cryotherapy  15 Minutes    Cryotherapy Location  Shoulder    Type of Cryotherapy  Ice pack      Electrical Stimulation   Electrical Stimulation Location  L shoulder superior lateral aspect     Electrical Stimulation Action  IFC     Electrical Stimulation Parameters  to tol     Electrical Stimulation Goals  Pain;Tone      Manual Therapy   Joint Mobilization  grade II Lt GH mobsin all directions, limitd d/t pt level of  pain    Passive ROM  Lt shoulder in all directions             PT Education - 09/28/18 1049    Education Details  progress, ROM, HEP with band , scapular position     Person(s) Educated  Patient    Methods  Explanation;Handout    Comprehension  Verbalized understanding;Returned demonstration;Verbal cues required       PT Short Term Goals - 09/14/18 1506      PT SHORT TERM GOAL #1   Title  Pt will be I with HEP for L UE ROM and strength     Baseline  known from previous episode [place] of state, needs cueing     Time  3    Period  Weeks    Status  New    Target Date  10/05/18      PT SHORT TERM GOAL #2   Title  Pt will be able to sit comfortably for meals, rest, pain no more than 5/10.     Baseline  severe, 10/10 today, writhing in pain at times.     Time  3    Period  Weeks    Status  New    Target Date  10/05/18      PT SHORT TERM GOAL #3   Title  Pt will be able to lift arm to 90 deg (despite pain) to work toward ADLs, grooming, normal tasks.     Baseline  75 deg  pain severe pain     Time  3    Period  Weeks    Status   New    Target Date  10/05/18      PT SHORT TERM GOAL #4   Title  Pt will identify pain mgmt strategies and understand pain science to improve functional mobility    Baseline  needs cues, uses ice, TENS unit     Time  3    Period  Weeks    Status  New    Target Date  10/05/18        PT Long Term Goals - 09/14/18 1514      PT LONG TERM GOAL #1   Title  TBA as patient progresses             Plan - 09/28/18 1022    Clinical Impression Statement  Patient continues to have pain and L shoulder stiffness, weakness limited in all aspects of functional mobility.  PROM to about 110 deg with pain.  She ordered a new TENS unit.      PT Treatment/Interventions  ADLs/Self Care Home Management;Moist Heat;Iontophoresis 4mg /ml Dexamethasone;Electrical Stimulation;Cryotherapy;Ultrasound;Therapeutic exercise;Therapeutic activities;Functional mobility training;Neuromuscular re-education;Patient/family education;Manual techniques;Passive range of motion;Taping    PT Next Visit Plan  shoulder ROM, initial scap stab    PT Home Exercise Plan  table slides, AAROM, pendulums, has some from previous PT out of state     Consulted and Agree with Plan of Care  Patient       Patient will benefit from skilled therapeutic intervention in order to improve the following deficits and impairments:  Decreased endurance, Decreased mobility, Hypomobility, Increased muscle spasms, Impaired sensation, Improper body mechanics, Decreased scar mobility, Decreased range of motion, Decreased activity tolerance, Decreased strength, Impaired flexibility, Postural dysfunction, Pain, Impaired UE functional use, Increased fascial restricitons  Visit Diagnosis: Acute pain of left shoulder  Chronic left shoulder pain  Muscle weakness (generalized)  Abnormal posture  Unspecified disturbances of skin sensation  Adhesive  capsulitis of left shoulder     Problem List Patient Active Problem List   Diagnosis Date Noted  .  Cervicalgia 09/03/2018  . Chronic left shoulder pain 06/23/2018  . Essential hypertension 06/23/2018  . S/P spinal fusion 06/23/2018  . S/P carpal tunnel release Left  06/23/2018  . Disorder of left rotator cuff 06/23/2018  . OSA on CPAP 06/23/2018  . Carpal tunnel syndrome of right wrist 06/23/2018  . Intermittent asthma without complication 29/93/7169    , 09/28/2018, 12:48 PM  [place], [place], [address] Phone: [phone]   Fax:  [phone]  Name: Yomaira Solar MRN: [id number] Date of Birth: 02/07/1971  Raeford Razor, PT 09/28/18 12:48 PM Phone: [phone] Fax: [phone]

## 2018-09-29 ENCOUNTER — Ambulatory Visit (INDEPENDENT_AMBULATORY_CARE_PROVIDER_SITE_OTHER): Payer: Medicaid Other | Admitting: Orthopaedic Surgery

## 2018-09-29 ENCOUNTER — Encounter (INDEPENDENT_AMBULATORY_CARE_PROVIDER_SITE_OTHER): Payer: Self-pay | Admitting: Orthopaedic Surgery

## 2018-09-29 VITALS — BP 121/89 | HR 65 | Ht 70.0 in | Wt 232.0 lb

## 2018-09-29 DIAGNOSIS — M25512 Pain in left shoulder: Secondary | ICD-10-CM

## 2018-09-29 DIAGNOSIS — G8929 Other chronic pain: Secondary | ICD-10-CM

## 2018-09-29 NOTE — Progress Notes (Signed)
Office Visit Note   Patient: Kathy Hodge           Date of Birth: 1971-04-19           MRN: [id number] Visit Date: 09/29/2018              Requested by: Scot Jun, [place], [place] [address] PCP: Scot Jun, FNP   Assessment & Plan: Visit Diagnoses:  1. Chronic left shoulder pain     Plan: Several weeks status post closed manipulation of adhesive capsulitis left shoulder.  Was not able to get to physical therapy until just recently.  I am concerned that she is going to redevelop adhesive capsulitis.  She has been involved in a chronic pain clinic.  She is a little bit better in terms of her motion today but wanted to continue with therapy.  Office 2 weeks.  Also having chronic low back pain evaluated in [place] where she was recently residing.  Will last therapy to evaluate her back as well.  Unable to work  Follow-Up Instructions: Return in about 2 weeks (around 10/13/2018).   Orders:  No orders of the defined types were placed in this encounter.  No orders of the defined types were placed in this encounter.     Procedures: No procedures performed   Clinical Data: No additional findings.   Subjective: Chief Complaint  Patient presents with  . Left Shoulder - Routine Post Op  . Shoulder Pain    mild pain and some discomfort   Several week status post manipulation of left shoulder with some residual adhesive capsulitis.  She is really had difficulty with the pain.  Has been involved in a chronic pain clinic which I think is going to compromise her result.  Motion is a little bit better  HPI  Review of Systems   Objective: Vital Signs: BP 121/89 (BP Location: Left Arm)   Pulse 65   Ht 5\' 10"  (1.778 m)   Wt 232 lb (105.2 kg)   BMI 33.29 kg/m   Physical Exam  Ortho Exam passive abduction and flexion of left shoulder about 90 degrees which is better than it was last week.  Neurologically intact Specialty Comments:    No specialty comments available.  Imaging: No results found.   PMFS History: Patient Active Problem List   Diagnosis Date Noted  . Cervicalgia 09/03/2018  . Chronic left shoulder pain 06/23/2018  . Essential hypertension 06/23/2018  . S/P spinal fusion 06/23/2018  . S/P carpal tunnel release Left  06/23/2018  . Disorder of left rotator cuff 06/23/2018  . OSA on CPAP 06/23/2018  . Carpal tunnel syndrome of right wrist 06/23/2018  . Intermittent asthma without complication 42/35/3614   Past Medical History:  Diagnosis Date  . Asthma   . Hypertension   . Sleep apnea     Family History  Problem Relation Age of Onset  . COPD Mother   . Diabetes Mother   . Bone cancer Father     Past Surgical History:  Procedure Laterality Date  . ABDOMINAL HYSTERECTOMY    . BACK SURGERY    . CESAREAN SECTION    . HAND TENDON SURGERY Left 04/21/2018   Social History   Occupational History  . Not on file  Tobacco Use  . Smoking status: Former Research scientist (life sciences)  . Smokeless tobacco: Never Used  Substance and Sexual Activity  . Alcohol use: Not Currently  . Drug use: Not on  file  . Sexual activity: Yes     Garald Balding, MD   Note - This record has been created using [employer].  Chart creation errors have been sought, but may not always  have been located. Such creation errors do not reflect on  the standard of medical care.

## 2018-10-05 ENCOUNTER — Ambulatory Visit: Payer: Medicaid Other | Admitting: Physical Therapy

## 2018-10-09 ENCOUNTER — Ambulatory Visit: Payer: Medicaid Other | Admitting: Physical Therapy

## 2018-10-12 ENCOUNTER — Telehealth: Payer: Self-pay | Admitting: Family Medicine

## 2018-10-12 ENCOUNTER — Ambulatory Visit: Payer: Medicaid Other | Admitting: Pulmonary Disease

## 2018-10-12 ENCOUNTER — Telehealth: Payer: Self-pay | Admitting: Pulmonary Disease

## 2018-10-12 MED ORDER — ALBUTEROL SULFATE HFA 108 (90 BASE) MCG/ACT IN AERS: 1.0000 | INHALATION_SPRAY | Freq: Four times a day (QID) | RESPIRATORY_TRACT | 2 refills | Status: DC | PRN

## 2018-10-12 MED ORDER — FLUTICASONE-SALMETEROL 113-14 MCG/ACT IN AEPB: 1.0000 | INHALATION_SPRAY | Freq: Two times a day (BID) | RESPIRATORY_TRACT | 3 refills | Status: DC

## 2018-10-12 NOTE — Telephone Encounter (Signed)
Medication for asthma management sent to [facility]

## 2018-10-12 NOTE — Telephone Encounter (Signed)
LMTCB

## 2018-10-12 NOTE — Telephone Encounter (Signed)
Patient called requesting a refill on her inhaler, states she is completely out and is in need of it. Please follow up.

## 2018-10-13 ENCOUNTER — Ambulatory Visit (INDEPENDENT_AMBULATORY_CARE_PROVIDER_SITE_OTHER): Payer: Medicaid Other | Admitting: Pulmonary Disease

## 2018-10-13 ENCOUNTER — Encounter: Payer: Self-pay | Admitting: Pulmonary Disease

## 2018-10-13 VITALS — BP 130/70 | HR 61 | Ht 70.0 in | Wt 245.0 lb

## 2018-10-13 DIAGNOSIS — Z9989 Dependence on other enabling machines and devices: Secondary | ICD-10-CM

## 2018-10-13 DIAGNOSIS — G4733 Obstructive sleep apnea (adult) (pediatric): Secondary | ICD-10-CM | POA: Diagnosis not present

## 2018-10-13 MED ORDER — ALBUTEROL SULFATE HFA 108 (90 BASE) MCG/ACT IN AERS: 1.0000 | INHALATION_SPRAY | Freq: Four times a day (QID) | RESPIRATORY_TRACT | 2 refills | Status: DC | PRN

## 2018-10-13 MED ORDER — ZOLPIDEM TARTRATE 10 MG PO TABS: 10.0000 mg | ORAL_TABLET | Freq: Every evening | ORAL | 3 refills | Status: DC | PRN

## 2018-10-13 NOTE — Progress Notes (Signed)
Kathy Hodge    [id number]    1971/08/08  Primary Care Physician:Harris, Carroll Sage, FNP  Referring Physician: Scot Jun, [address] [place], [place] [address]  Chief complaint:   History of obstructive sleep apnea History of insomnia  HPI:  Patient with a history of obstructive sleep apnea -Has not been able to tolerate CPAP well secondary to difficulty with the mask -Has tried different approaches with no significant success -Seems to turn around a lot in sleep  Diagnosed with moderate obstructive sleep apnea  Usually tries to go to bed about 05/04/2009 pM Takes about an hour to fall asleep Usually tries to get up finally at about 6:30 AM Wakes up 4-5 times at night  She has been using Ambien on a regular basis to help sleep-Ambien has been helping  History of hypertension, cardiac rhythm problems History of asthma-well-controlled History of allergies History of chronic back pain which may be contributing to multiple awakenings at night  She is a reformed smoker  Outpatient Encounter Medications as of 10/13/2018  Medication Sig  . albuterol (PROVENTIL HFA;VENTOLIN HFA) 108 (90 Base) MCG/ACT inhaler Inhale 1-2 puffs into the lungs every 6 (six) hours as needed for wheezing or shortness of breath.  Marland Kitchen amLODipine (NORVASC) 5 MG tablet Take 5 mg by mouth daily.  . fexofenadine-pseudoephedrine (ALLEGRA-D) 60-120 MG 12 hr tablet Take 1 tablet by mouth 2 (two) times daily.  . Fluticasone-Salmeterol 113-14 MCG/ACT AEPB Inhale 1 puff into the lungs 2 (two) times daily.  Marland Kitchen gabapentin (NEURONTIN) 600 MG tablet Take 600 mg by mouth 3 (three) times daily.  . hydrochlorothiazide (MICROZIDE) 12.5 MG capsule Take 12.5 mg by mouth daily.  . methocarbamol (ROBAXIN) 500 MG tablet Take 1 tablet (500 mg total) by mouth every 6 (six) hours as needed for muscle spasms.  . montelukast (SINGULAIR) 10 MG tablet Take 10 mg by mouth at bedtime.  . ondansetron  (ZOFRAN ODT) 4 MG disintegrating tablet Take 1 tablet (4 mg total) by mouth every 8 (eight) hours as needed for nausea or vomiting.  Marland Kitchen oxyCODONE-acetaminophen (PERCOCET) 10-325 MG tablet Take 1 tablet by mouth every 4 (four) hours as needed for pain.  Marland Kitchen tiZANidine (ZANAFLEX) 4 MG capsule Take 1 capsule (4 mg total) by mouth 3 (three) times daily.  Marland Kitchen topiramate (TOPAMAX) 25 MG tablet Take 1 tablet (25 mg total) by mouth 2 (two) times daily.  Marland Kitchen zolpidem (AMBIEN) 10 MG tablet Take 1 tablet (10 mg total) by mouth at bedtime as needed for sleep.  . [DISCONTINUED] dicyclomine (BENTYL) 10 MG capsule Take 1 capsule (10 mg total) by mouth 4 (four) times daily -  before meals and at bedtime for 7 days.  . famotidine (PEPCID) 20 MG tablet Take 1 tablet (20 mg total) by mouth 2 (two) times daily. (Patient not taking: Reported on 10/13/2018)  . [DISCONTINUED] albuterol (PROVENTIL HFA;VENTOLIN HFA) 108 (90 Base) MCG/ACT inhaler Inhale 1-2 puffs into the lungs every 6 (six) hours as needed for wheezing or shortness of breath.  . [DISCONTINUED] albuterol (PROVENTIL) (2.5 MG/3ML) 0.083% nebulizer solution Take 2.5 mg by nebulization every 6 (six) hours as needed for wheezing or shortness of breath.  . [DISCONTINUED] Fluticasone-Salmeterol 113-14 MCG/ACT AEPB Inhale 1 puff into the lungs 2 (two) times daily.   No facility-administered encounter medications on file as of 10/13/2018.     Allergies as of 10/13/2018 - Review Complete 10/13/2018  Allergen Reaction Noted  .  Peanut-containing drug products Anaphylaxis 06/17/2018    Past Medical History:  Diagnosis Date  . Asthma   . Hypertension   . Sleep apnea     Past Surgical History:  Procedure Laterality Date  . ABDOMINAL HYSTERECTOMY    . BACK SURGERY    . CESAREAN SECTION    . HAND TENDON SURGERY Left 04/21/2018    Family History  Problem Relation Age of Onset  . COPD Mother   . Diabetes Mother   . Bone cancer Father     Social History    Socioeconomic History  . Marital status: Single    Spouse name: Not on file  . Number of children: Not on file  . Years of education: Not on file  . Highest education level: Not on file  Occupational History  . Not on file  Social Needs  . Financial resource strain: Not on file  . Food insecurity:    Worry: Not on file    Inability: Not on file  . Transportation needs:    Medical: Not on file    Non-medical: Not on file  Tobacco Use  . Smoking status: Former Research scientist (life sciences)  . Smokeless tobacco: Never Used  Substance and Sexual Activity  . Alcohol use: Not Currently  . Drug use: Not on file  . Sexual activity: Yes  Lifestyle  . Physical activity:    Days per week: Not on file    Minutes per session: Not on file  . Stress: Not on file  Relationships  . Social connections:    Talks on phone: Not on file    Gets together: Not on file    Attends religious service: Not on file    Active member of club or organization: Not on file    Attends meetings of clubs or organizations: Not on file    Relationship status: Not on file  . Intimate partner violence:    Fear of current or ex partner: Not on file    Emotionally abused: Not on file    Physically abused: Not on file    Forced sexual activity: Not on file  Other Topics Concern  . Not on file  Social History Narrative  . Not on file    Review of Systems  Constitutional: Negative.   HENT: Negative.   Eyes: Negative.   Respiratory: Positive for apnea and shortness of breath. Negative for wheezing.   Cardiovascular: Negative.   Gastrointestinal: Negative.   Psychiatric/Behavioral: Positive for sleep disturbance.  All other systems reviewed and are negative.   Vitals:   10/13/18 1117  BP: 130/70  Pulse: 61  SpO2: 99%     Physical Exam  Constitutional: She appears well-developed and well-nourished.  HENT:  Head: Normocephalic and atraumatic.  Mallampati 3  Eyes: Pupils are equal, round, and reactive to light.  Right eye exhibits no discharge. Left eye exhibits no discharge.  Neck: Normal range of motion. Neck supple. No tracheal deviation present. No thyromegaly present.  Cardiovascular: Normal rate and regular rhythm.  Pulmonary/Chest: Effort normal and breath sounds normal. No respiratory distress. She has no wheezes. She has no rales. She exhibits no tenderness.  Abdominal: Soft. Bowel sounds are normal. She exhibits no distension. There is no abdominal tenderness.   Sleep study was from last year, not available at present  Assessment:  Sleep onset and maintenance insomnia  Moderate obstructive sleep apnea-on CPAP therapy  We will continue to make attempts at obtaining the study results  Plan/Recommendations: Continue CPAP  use -Has tried multiple interventions, still not able to tolerate CPAP -Encouraged to continue trying CPAP  -We will make a referral to dentistry to see whether she will be a candidate for an oral device  We will obtain a download from her machine at next visit -Download not available at present  Prescription for Ambien for insomnia was provided -Ambien seems to be helping we will continue at present  Importance of compliance with CPAP was reiterated  Exercise and weight loss discussed  We will see her back in the office in about 3 months Encouraged to call with any significant concerns  Sherrilyn Rist MD [facility] 10/13/2018, 11:36 AM  CC: Scot Jun, FNP

## 2018-10-13 NOTE — Patient Instructions (Signed)
Obstructive sleep apnea Sleep onset and sleep maintenance insomnia  Continue use of Ambien  Encouraged to continue use of CPAP Will refer to dentistry for an oral device as an option of treatment  I will see you back in the office in about 3 months

## 2018-10-13 NOTE — Telephone Encounter (Signed)
Called patient, unable to reach left message to give us a call back. 

## 2018-10-14 NOTE — Telephone Encounter (Signed)
LMTCB x3 for pt.  

## 2018-10-15 NOTE — Telephone Encounter (Signed)
Called patient, unable to reach left message to give [place] a call back. Per protocol will close encounter.

## 2018-10-21 ENCOUNTER — Telehealth: Payer: Self-pay | Admitting: Family Medicine

## 2018-10-21 ENCOUNTER — Ambulatory Visit: Payer: Medicaid Other | Admitting: Diagnostic Neuroimaging

## 2018-10-21 ENCOUNTER — Encounter: Payer: Self-pay | Admitting: Diagnostic Neuroimaging

## 2018-10-21 VITALS — BP 139/87 | HR 63 | Ht 70.0 in | Wt 245.0 lb

## 2018-10-21 DIAGNOSIS — M79602 Pain in left arm: Secondary | ICD-10-CM | POA: Diagnosis not present

## 2018-10-21 NOTE — Telephone Encounter (Signed)
Patient would like a referral to OB/GYN, patient states that she had requested this referral during her last visit, please follow up.
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
## 2018-10-21 NOTE — Addendum Note (Signed)
Addended by: Scot Jun on: 10/21/2018 02:46 PM   Modules accepted: Orders

## 2018-10-21 NOTE — Telephone Encounter (Signed)
Informed patient

## 2018-10-21 NOTE — Telephone Encounter (Signed)
Referral placed again to OB/GYN. Originally was placed during her December visit. If she has not been contacted to schedule an appointment within 7-10 business days contact our office in order for [place] to follow-up.

## 2018-10-27 ENCOUNTER — Ambulatory Visit (INDEPENDENT_AMBULATORY_CARE_PROVIDER_SITE_OTHER): Payer: Medicaid Other | Admitting: *Deleted

## 2018-10-27 ENCOUNTER — Other Ambulatory Visit: Payer: Self-pay

## 2018-10-27 ENCOUNTER — Other Ambulatory Visit (HOSPITAL_COMMUNITY)
Admission: RE | Admit: 2018-10-27 | Discharge: 2018-10-27 | Disposition: A | Discharge status: Home / Self Care | Payer: Medicaid Other | Source: Ambulatory Visit | Attending: Obstetrics and Gynecology | Admitting: Obstetrics and Gynecology

## 2018-10-27 ENCOUNTER — Other Ambulatory Visit: Payer: Self-pay | Admitting: Obstetrics and Gynecology

## 2018-10-27 VITALS — BP 127/82 | HR 76 | Temp 97.9°F | Ht 70.0 in | Wt 244.8 lb

## 2018-10-27 DIAGNOSIS — Z113 Encounter for screening for infections with a predominantly sexual mode of transmission: Secondary | ICD-10-CM

## 2018-10-27 DIAGNOSIS — N898 Other specified noninflammatory disorders of vagina: Secondary | ICD-10-CM

## 2018-10-27 NOTE — Patient Instructions (Addendum)
Vaginitis  Vaginitis is irritation and swelling (inflammation) of the vagina. It happens when normal bacteria and yeast in the vagina grow too much. There are many types of this condition. Treatment will depend on the type you have. Follow these instructions at home: Lifestyle  Keep your vagina area clean and dry. ? Avoid using soap. ? Rinse the area with water.  Do not do the following until your doctor says it is okay: ? Wash and clean out the vagina (douche). ? Use tampons. ? Have sex.  Wipe from front to back after going to the bathroom.  Let air reach your vagina. ? Wear cotton underwear. ? Do not wear: ? Underwear while you sleep. ? Tight pants. ? Thong underwear. ? Underwear or nylons without a cotton panel. ? Take off any wet clothing, such as bathing suits, as soon as possible.  Use gentle, non-scented products. Do not use things that can irritate the vagina, such as fabric softeners. Avoid the following products if they are scented: ? Feminine sprays. ? Detergents. ? Tampons. ? Feminine hygiene products. ? Soaps or bubble baths.  Practice safe sex and use condoms. General instructions  Take over-the-counter and prescription medicines only as told by your doctor.  If you were prescribed an antibiotic medicine, take or use it as told by your doctor. Do not stop taking or using the antibiotic even if you start to feel better.  Keep all follow-up visits as told by your doctor. This is important. Contact a doctor if:  You have pain in your belly.  You have a fever.  Your symptoms last for more than 2-3 days. Get help right away if:  You have a fever and your symptoms get worse all of a sudden. Summary  Vaginitis is irritation and swelling of the vagina. It can happen when the normal bacteria and yeast in the vagina grow too much. There are many types.  Treatment will depend on the type you have.  Do not douche, use tampons , or have sex until your  health care provider approves. When you can return to sex, practice safe sex and use condoms. This information is not intended to replace advice given to you by your health care provider. Make sure you discuss any questions you have with your health care provider. Document Released: 11/15/2008 Document Revised: 09/10/2016 Document Reviewed: 09/10/2016 Elsevier Interactive Patient Education  2019 [place].  Probiotics Probiotics are the good bacteria and yeasts that live in your body and keep your digestive system healthy. Probiotics also help your body's defense system (immune system) and protect your body against the growth of harmful bacteria. Your health care provider may recommend taking a probiotic if you are taking antibiotics or have certain medical conditions, such as:  Diarrhea.  Constipation.  Irritable bowel syndrome.  Lung infections.  Yeast infections.  Acne, eczema, and other skin conditions.  Frequent urinary tract infections. What affects the balance of bacteria in my body? The balance of good bacteria in your body can be affected by:  Antibiotic medicines. These medicines treat infections caused by bacteria. Unfortunately, they may kill the good bacteria in your body as well as the bad bacteria.  Certain medical conditions. Conditions related to an imbalance of bacteria include: ? Stomach and intestine (gastrointestinal) infections. ? Lung infections. ? Skin infections. ? Vaginal infections. ? Inflammatory bowel diseases. ? Stomach ulcers (gastric ulcers). ? Tooth decay and gum disease (periodontal disease).  Stress.  Poor diet. What type of  probiotic is right for me? Probiotics contain different types of bacteria (strains). Strains commonly found in probiotics include:  Lactobacillus.  Saccharomyces.  Bifidobacterium. Specific strains have been shown to be more effective for certain health conditions. Ask your health care provider which strain or  strains you should use and how often. Probiotics come in many different forms, strain combinations, and strengths. Some may need to be refrigerated. Always read the label for storage and usage instructions. Certain foods, such as yogurt, contain probiotics. Probiotics can also be bought as a supplement at a pharmacy, health food store, or grocery store. Talk to your health care provider before starting any supplement. What are the side effects of probiotics? Some people have side effects when taking probiotics. Side effects are usually temporary and may include:  Gas.  Bloating.  Cramping. Serious side effects are rare. Follow these instructions at home:   If you are taking probiotics with antibiotics: ? Wait at least 2 hours between taking your medicine and the probiotic. ? Eat foods high in fiber, such as whole grains, beans, and vegetables. These foods can help good bacteria grow. ? Avoid certain foods as told by your health care provider. Summary  Probiotics are the good bacteria and yeasts that live in your body and keep you and your digestive system healthy.  Certain foods, such as yogurt, contain probiotics.  Probiotics can be taken as supplements. They can be bought at a pharmacy, health food store, or grocery store. They come in many different forms, strain combinations, and strengths.  Be sure to talk with your health care provider before taking a probiotic supplement. This information is not intended to replace advice given to you by your health care provider. Make sure you discuss any questions you have with your health care provider. Document Released: 03/16/2014 Document Revised: 05/08/2018 Document Reviewed: 09/03/2017 Elsevier Interactive Patient Education  2019 [employer].

## 2018-10-27 NOTE — Progress Notes (Signed)
   SUBJECTIVE:  48 y.o. female complains of white vaginal discharge and odor for 1-2 week(s). Denies abnormal vaginal bleeding or significant pelvic pain or fever. No UTI symptoms. Denies history of known exposure to STD.  Patient reported that frequent vaginal infections due to number of medications she is taking. If antibiotics are prescribed, patient usually get yeast infection.  No LMP recorded. Patient has had a hysterectomy.  OBJECTIVE:  She appears well, afebrile. Urine dipstick: not done.  Today's Vitals   10/27/18 0827  BP: 127/82  Pulse: 76  Temp: 97.9 F (36.6 C)  TempSrc: Oral  Weight: 244 lb 12.8 oz (111 kg)  Height: 5\' 10"  (1.778 m)  PainSc: 0-No pain   Body mass index is 35.13 kg/m.  ASSESSMENT:  Vaginal Discharge  Vaginal Odor   PLAN:  GC, chlamydia, trichomonas, BVAG, CVAG probe sent to lab. Treatment: To be determined once lab results are received ROV prn if symptoms persist or worsen. Patient to sign ROI for PAP results.

## 2018-10-28 LAB — HIV ANTIBODY (ROUTINE TESTING W REFLEX): HIV Screen 4th Generation wRfx: NONREACTIVE

## 2018-10-28 LAB — RPR: RPR Ser Ql: NONREACTIVE

## 2018-10-29 ENCOUNTER — Encounter (INDEPENDENT_AMBULATORY_CARE_PROVIDER_SITE_OTHER): Payer: Self-pay | Admitting: Orthopaedic Surgery

## 2018-10-29 ENCOUNTER — Ambulatory Visit (INDEPENDENT_AMBULATORY_CARE_PROVIDER_SITE_OTHER): Payer: Medicaid Other | Admitting: Orthopaedic Surgery

## 2018-10-29 VITALS — BP 141/104 | HR 64 | Ht 70.0 in | Wt 239.0 lb

## 2018-10-29 DIAGNOSIS — M25512 Pain in left shoulder: Secondary | ICD-10-CM

## 2018-10-29 DIAGNOSIS — G8929 Other chronic pain: Secondary | ICD-10-CM

## 2018-10-29 LAB — CERVICOVAGINAL ANCILLARY ONLY
Bacterial vaginitis: NEGATIVE
Candida vaginitis: NEGATIVE
Chlamydia: NEGATIVE
Neisseria Gonorrhea: NEGATIVE
Trichomonas: NEGATIVE
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
## 2018-11-04 ENCOUNTER — Ambulatory Visit: Payer: Medicaid Other | Attending: Orthopaedic Surgery | Admitting: Physical Therapy

## 2018-11-04 ENCOUNTER — Encounter: Payer: Self-pay | Admitting: Physical Therapy

## 2018-11-04 ENCOUNTER — Inpatient Hospital Stay (INDEPENDENT_AMBULATORY_CARE_PROVIDER_SITE_OTHER): Payer: Medicaid Other | Admitting: Orthopaedic Surgery

## 2018-11-04 DIAGNOSIS — R293 Abnormal posture: Secondary | ICD-10-CM | POA: Insufficient documentation

## 2018-11-04 DIAGNOSIS — G8929 Other chronic pain: Secondary | ICD-10-CM | POA: Diagnosis present

## 2018-11-04 DIAGNOSIS — M7502 Adhesive capsulitis of left shoulder: Secondary | ICD-10-CM

## 2018-11-04 DIAGNOSIS — M5441 Lumbago with sciatica, right side: Secondary | ICD-10-CM | POA: Insufficient documentation

## 2018-11-04 DIAGNOSIS — R209 Unspecified disturbances of skin sensation: Secondary | ICD-10-CM | POA: Diagnosis present

## 2018-11-04 DIAGNOSIS — M25512 Pain in left shoulder: Secondary | ICD-10-CM | POA: Diagnosis present

## 2018-11-04 DIAGNOSIS — M6281 Muscle weakness (generalized): Secondary | ICD-10-CM | POA: Diagnosis present
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
## 2018-11-09 ENCOUNTER — Other Ambulatory Visit: Payer: Self-pay | Admitting: Orthopedic Surgery

## 2018-11-09 DIAGNOSIS — M259 Joint disorder, unspecified: Secondary | ICD-10-CM

## 2018-11-11 ENCOUNTER — Other Ambulatory Visit: Payer: Self-pay

## 2018-11-11 ENCOUNTER — Emergency Department (HOSPITAL_COMMUNITY): Payer: Medicaid Other

## 2018-11-11 ENCOUNTER — Ambulatory Visit: Payer: Medicaid Other | Admitting: Physical Therapy

## 2018-11-11 ENCOUNTER — Encounter: Payer: Self-pay | Admitting: Physical Therapy

## 2018-11-11 ENCOUNTER — Emergency Department (HOSPITAL_COMMUNITY)
Admission: EM | Admit: 2018-11-11 | Discharge: 2018-11-11 | Disposition: A | Discharge status: Home / Self Care | Payer: Medicaid Other | Attending: Emergency Medicine | Admitting: Emergency Medicine

## 2018-11-11 ENCOUNTER — Encounter (HOSPITAL_COMMUNITY): Payer: Self-pay | Admitting: Emergency Medicine

## 2018-11-11 ENCOUNTER — Ambulatory Visit (HOSPITAL_COMMUNITY)
Admission: EM | Admit: 2018-11-11 | Discharge: 2018-11-11 | Disposition: A | Discharge status: Home / Self Care | Payer: Medicaid Other | Attending: Family Medicine | Admitting: Family Medicine

## 2018-11-11 DIAGNOSIS — R519 Headache, unspecified: Secondary | ICD-10-CM

## 2018-11-11 DIAGNOSIS — R51 Headache: Secondary | ICD-10-CM | POA: Insufficient documentation

## 2018-11-11 DIAGNOSIS — Z79899 Other long term (current) drug therapy: Secondary | ICD-10-CM | POA: Insufficient documentation

## 2018-11-11 DIAGNOSIS — J45909 Unspecified asthma, uncomplicated: Secondary | ICD-10-CM | POA: Diagnosis not present

## 2018-11-11 DIAGNOSIS — Z87891 Personal history of nicotine dependence: Secondary | ICD-10-CM | POA: Diagnosis not present

## 2018-11-11 DIAGNOSIS — G8929 Other chronic pain: Secondary | ICD-10-CM

## 2018-11-11 DIAGNOSIS — M6281 Muscle weakness (generalized): Secondary | ICD-10-CM

## 2018-11-11 DIAGNOSIS — I10 Essential (primary) hypertension: Secondary | ICD-10-CM | POA: Diagnosis not present

## 2018-11-11 DIAGNOSIS — M7502 Adhesive capsulitis of left shoulder: Secondary | ICD-10-CM

## 2018-11-11 DIAGNOSIS — M25512 Pain in left shoulder: Secondary | ICD-10-CM | POA: Diagnosis not present

## 2018-11-11 DIAGNOSIS — R293 Abnormal posture: Secondary | ICD-10-CM

## 2018-11-11 DIAGNOSIS — M5441 Lumbago with sciatica, right side: Secondary | ICD-10-CM

## 2018-11-11 DIAGNOSIS — R209 Unspecified disturbances of skin sensation: Secondary | ICD-10-CM

## 2018-11-11 MED ORDER — METOCLOPRAMIDE HCL 10 MG PO TABS
5.0000 mg | ORAL_TABLET | Freq: Once | ORAL | Status: AC
  Administered 2018-11-11: 5 mg via ORAL
  Filled 2018-11-11: qty 1

## 2018-11-11 MED ORDER — KETOROLAC TROMETHAMINE 30 MG/ML IJ SOLN
30.0000 mg | Freq: Once | INTRAMUSCULAR | Status: AC
  Administered 2018-11-11: 30 mg via INTRAMUSCULAR
  Filled 2018-11-11: qty 1

## 2018-11-11 MED ORDER — DIPHENHYDRAMINE HCL 25 MG PO CAPS
25.0000 mg | ORAL_CAPSULE | Freq: Once | ORAL | Status: AC
  Administered 2018-11-11: 25 mg via ORAL
  Filled 2018-11-11: qty 1
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
## 2018-11-11 NOTE — ED Triage Notes (Signed)
Pt arrived pov from home c/o headache. Pt rates pain 6-7/10.
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
## 2018-11-11 NOTE — ED Triage Notes (Signed)
Pt states shes had headaches for several weeks, states she has hx of migraines, takes Topamax

## 2018-11-11 NOTE — Discharge Instructions (Signed)
Please go to emergency room for further evaluation of your headache

## 2018-11-11 NOTE — Discharge Instructions (Addendum)
Please read attached information. If you experience any new or worsening signs or symptoms please return to the emergency room for evaluation. Please follow-up with your primary care provider or specialist as discussed.  °

## 2018-11-11 NOTE — ED Notes (Signed)
Patient verbalizes understanding of discharge instructions. Opportunity for questioning and answers were provided. Armband removed by staff, pt discharged from ED.  

## 2018-11-16 ENCOUNTER — Ambulatory Visit: Payer: Medicaid Other | Admitting: Physical Therapy

## 2018-11-17 ENCOUNTER — Ambulatory Visit
Admission: RE | Admit: 2018-11-17 | Discharge: 2018-11-17 | Disposition: A | Discharge status: Home / Self Care | Payer: Medicaid Other | Source: Ambulatory Visit | Attending: Orthopedic Surgery | Admitting: Orthopedic Surgery

## 2018-11-17 ENCOUNTER — Other Ambulatory Visit: Payer: Self-pay

## 2018-11-17 ENCOUNTER — Inpatient Hospital Stay: Admission: RE | Admit: 2018-11-17 | Payer: Medicaid Other | Source: Ambulatory Visit

## 2018-11-17 ENCOUNTER — Other Ambulatory Visit: Payer: Medicaid Other

## 2018-11-17 DIAGNOSIS — M259 Joint disorder, unspecified: Secondary | ICD-10-CM

## 2018-11-18 ENCOUNTER — Ambulatory Visit: Payer: Medicaid Other | Admitting: Physical Therapy

## 2018-11-23 ENCOUNTER — Ambulatory Visit: Payer: Medicaid Other | Admitting: Physical Therapy

## 2018-11-23 ENCOUNTER — Ambulatory Visit: Payer: Medicaid Other | Admitting: Family Medicine

## 2018-11-25 ENCOUNTER — Ambulatory Visit: Payer: Medicaid Other | Admitting: Physical Therapy

## 2018-11-26 ENCOUNTER — Other Ambulatory Visit: Payer: Self-pay | Admitting: Family Medicine

## 2018-11-26 ENCOUNTER — Encounter: Payer: Self-pay | Admitting: Family Medicine

## 2018-11-26 MED ORDER — FLUTICASONE-SALMETEROL 113-14 MCG/ACT IN AEPB: 1.0000 | INHALATION_SPRAY | Freq: Two times a day (BID) | RESPIRATORY_TRACT | 3 refills | Status: DC

## 2018-11-26 MED ORDER — ALBUTEROL SULFATE HFA 108 (90 BASE) MCG/ACT IN AERS: 1.0000 | INHALATION_SPRAY | Freq: Four times a day (QID) | RESPIRATORY_TRACT | 2 refills | Status: DC | PRN

## 2018-11-26 MED ORDER — MONTELUKAST SODIUM 10 MG PO TABS: 10.0000 mg | ORAL_TABLET | Freq: Every day | ORAL | 2 refills | Status: DC

## 2018-11-26 NOTE — Progress Notes (Signed)
Refilled Singulair.

## 2018-11-30 ENCOUNTER — Other Ambulatory Visit: Payer: Medicaid Other

## 2018-11-30 ENCOUNTER — Ambulatory Visit: Payer: Medicaid Other | Admitting: Physical Therapy

## 2018-12-02 ENCOUNTER — Ambulatory Visit: Payer: Medicaid Other | Admitting: Physical Therapy

## 2018-12-07 ENCOUNTER — Encounter: Payer: Self-pay | Admitting: Family Medicine

## 2018-12-07 ENCOUNTER — Telehealth: Payer: Self-pay | Admitting: Physical Therapy

## 2018-12-07 MED ORDER — FLUTICASONE PROPIONATE 50 MCG/ACT NA SUSP: 2.0000 | Freq: Every day | NASAL | 6 refills | Status: DC

## 2018-12-07 NOTE — Telephone Encounter (Signed)
she was contacted today regarding temporary reduction of Outpatient Rehabilitation Services at [facility] due to concerns for community transmission of COVID-19.  Patient identity was verified.  Assessed if patient needed to be seen in person by clinician (recent fall or acute injury that requires hands on assessment and advice, change in diet order, post-surgical, special cases, etc.).    Patient did not have an acute/special need that requires in person visit. Proceeded with phone call.  Therapist advised the patient to continue to perform his/her HEP and assured he/she had no unanswered questions or concerns at this time.   The patient expressed interest in being contacted for an E-Visit, virtual check in, or Telehealth visit to continue their plan of care, when those services become available.  Outpatient Rehabilitation Services at [facility] will follow up with patient at that time.   Patient is aware we can be reached by telephone during limited business hours in the meantime.   Elsie Ra, PT, DPT 12/07/18 2:15 PM

## 2018-12-08 ENCOUNTER — Encounter: Payer: Self-pay | Admitting: Family Medicine

## 2018-12-08 NOTE — Telephone Encounter (Signed)
Patient sent email inquiry for a peak flow meter.  She has mild tightness in chest but did not want a televisit.  Reviewed peak flow meter instructions with patient to insure this was what she was requesting and she acknowledged her understanding.    Dr. Ander Slade please advise if you want to proceed with this order.  Thanks

## 2018-12-09 ENCOUNTER — Encounter (HOSPITAL_BASED_OUTPATIENT_CLINIC_OR_DEPARTMENT_OTHER): Payer: Self-pay

## 2018-12-09 ENCOUNTER — Other Ambulatory Visit: Payer: Self-pay

## 2018-12-09 ENCOUNTER — Encounter: Payer: Self-pay | Admitting: Acute Care

## 2018-12-09 ENCOUNTER — Ambulatory Visit (INDEPENDENT_AMBULATORY_CARE_PROVIDER_SITE_OTHER): Payer: Medicaid Other | Admitting: Acute Care

## 2018-12-09 ENCOUNTER — Emergency Department (HOSPITAL_BASED_OUTPATIENT_CLINIC_OR_DEPARTMENT_OTHER)
Admission: EM | Admit: 2018-12-09 | Discharge: 2018-12-09 | Disposition: A | Discharge status: Home / Self Care | Payer: Medicaid Other | Attending: Emergency Medicine | Admitting: Emergency Medicine

## 2018-12-09 ENCOUNTER — Ambulatory Visit: Payer: Medicaid Other | Admitting: Physical Therapy

## 2018-12-09 ENCOUNTER — Emergency Department (HOSPITAL_BASED_OUTPATIENT_CLINIC_OR_DEPARTMENT_OTHER): Payer: Medicaid Other

## 2018-12-09 VITALS — BP 160/92 | HR 88 | Temp 98.7°F

## 2018-12-09 DIAGNOSIS — Z87891 Personal history of nicotine dependence: Secondary | ICD-10-CM | POA: Insufficient documentation

## 2018-12-09 DIAGNOSIS — R0789 Other chest pain: Secondary | ICD-10-CM | POA: Insufficient documentation

## 2018-12-09 DIAGNOSIS — I10 Essential (primary) hypertension: Secondary | ICD-10-CM | POA: Insufficient documentation

## 2018-12-09 DIAGNOSIS — Z79899 Other long term (current) drug therapy: Secondary | ICD-10-CM | POA: Diagnosis not present

## 2018-12-09 DIAGNOSIS — J45909 Unspecified asthma, uncomplicated: Secondary | ICD-10-CM | POA: Diagnosis not present

## 2018-12-09 LAB — BASIC METABOLIC PANEL
Anion gap: 6 (ref 5–15)
BUN: 16 mg/dL (ref 6–20)
CO2: 24 mmol/L (ref 22–32)
Calcium: 9 mg/dL (ref 8.9–10.3)
Chloride: 109 mmol/L (ref 98–111)
Creatinine, Ser: 0.72 mg/dL (ref 0.44–1.00)
GFR calc Af Amer: >60 mL/min (ref >60)
GFR calc non Af Amer: >60 mL/min (ref >60)
Glucose, Bld: 91 mg/dL (ref 70–99)
Potassium: 3.5 mmol/L (ref 3.5–5.1)
Sodium: 139 mmol/L (ref 135–145)

## 2018-12-09 LAB — CBC
HCT: 40.8 % (ref 36.0–46.0)
Hemoglobin: 12.7 g/dL (ref 12.0–15.0)
MCH: 28 pg (ref 26.0–34.0)
MCHC: 31.1 g/dL (ref 30.0–36.0)
MCV: 90.1 fL (ref 80.0–100.0)
Platelets: 317 10*3/uL (ref 150–400)
RBC: 4.53 MIL/uL (ref 3.87–5.11)
RDW: 13.2 % (ref 11.5–15.5)
WBC: 6.2 10*3/uL (ref 4.0–10.5)
nRBC: 0 % (ref 0.0–0.2)

## 2018-12-09 LAB — TROPONIN I: Troponin I: <0.03 ng/mL (ref <0.03)

## 2018-12-09 MED ORDER — SODIUM CHLORIDE 0.9% FLUSH
3.0000 mL | Freq: Once | INTRAVENOUS | Status: DC
  Filled 2018-12-09: qty 3

## 2018-12-09 NOTE — Patient Instructions (Addendum)
Recommend going to [facility] to the ER  for CXR and EKG, possible CTA  Based on the ED providers assessment,  As none of her maintenance   asthma treatments have not improved her chest tightness ( Left sided). Continue ProAir as you have been doing Inhale 1-2 puffs into the lungs every 6 (six) hours as needed for wheezing or shortness of breath. Please take you hydrochlorothiazide as prescribed ( 12.5 mg once daily) Please call your PCP office to get refill sent as soon as possible. Follow up Tele Visit in 1 week to ensure you have the medications you need Follow up with Dr. Ander Slade 03/2019.   [facility] [place], [place]  [phone]
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
## 2018-12-09 NOTE — Progress Notes (Signed)
Virtual Visit via Telephone Note  I connected with Kathy Hodge on 12/09/18 at  2:00 PM EDT by telephone and verified that I am speaking with the correct person using two identifiers.   I discussed the limitations, risks, security and privacy concerns of performing an evaluation and management service by telephone and the availability of in person appointments. I also discussed with the patient that there may be a patient responsible charge related to this service. The patient expressed understanding and agreed to proceed.  I  confirmed date of birth and address to authenticate patient   Identity. My nurse [name], LPN reviewed medications and ordered any refills required.   Synopsis: 48 year old female former smoker ( Quit 2012)  With moderate  OSA on CPAP therapy, Mild asthma and insomnia. She is followed by Dr. Ander Slade.   History of Present Illness: Pt. Initially called the office 12/08/2018 requesting a flow meter. She was worried that her asthma was flaring.She has had some chest tightness prior to wheezing She called again today, 12/09/2018 requesting a tele-visit. She states her chest has been tight for a few days, but she has not experienced wheezing. She states her chest pain is constant, not intermittent, and is left sided. She denies any radiation of her chest pain..She denies congestion. She has some Shortness of breath. She is not taking her BP medications. In the ED 11/11/2018 BP was 150/93. She states she has not taken it because her BP was " OK" at the doctors office. She was not instructed to stop taking her BP meds by any medical professional. She did this on her own.She states she is using her Flonase  and her Singulair. She has not had to use her rescue inhaler.She does have an neb machine and she uses this  with albuterol as needed. Again, she has not used this in relation to her current shortness of breath. BP today while on the phone ( we had her check it)  is 160/92, HR is  88. She states she does not have fever or cough.She does state that her chest tightness is worse when laying flat. She tried her nebulizer , and her chest tightness has  not resolved. She states she has been using her CPAP machine.She states she has been socially isolating.She has left sided chest pain, which she says is worse with activity.She has recently moved here from [place]. She was being followed by cardiologist in [place]. She states this was primarily for her HTN, but she did have a stress test done in the past that she was unable to complete as she cannot run on a treadmill.  She states he does not think this is her asthma as she has no wheezing and it is not improving with her asthma regimen of inhalers and CPAP use.    Observations/Objective:  Pulmonary Embolism Wells Score Select Criteria:  Symptoms of DVT (3 points) Leg pain, swelling in the calf, foot, or leg>> No Swelling or tenderness in extremities, tenderness, warm skin>> No  No alternative diagnosis better explains the illness (3 points)>> Yes, Asthma  Tachycardia with pulse > 100 (1.5 points)>> NO>> zero points  Immobilization (>= 3 days) or surgery in the previous four weeks (1.5 points) No  Prior history of DVT or pulmonary embolism (1.5 points) No  Presence of hemoptysis (1 point) No  Presence of malignancy (1 point) No Results: Total Criteria Point Count:   0  Pulmonary Embolism Risk Score Interpretation Score > 6:  High probability Score >= 2 and <= 6: Moderate probability Score < 2: Low Probability.    Assessment and Plan: Chest tightness that does not resolve with albuterol inhalers or nebs Chest tightness is left side of chest, worsens with exertion Orthopnea at bedtime Non-compliant with HCTZ for BP. ( Took herself off as she did not feel she needed it) Last several BP checks in ED and at [facility] have been high No recent 12 Lead EKG in the Epic  system Plan Recommend going to [facility] to  the ER  for CXR and EKG, possible CTA  Based on the ED providers assessment,  As none of her maintenance   asthma treatments have improved her chest tightness ( Left sided), worse with activity. Continue ProAir as you have been doing Inhale 1-2 puffs into the lungs every 6 (six) hours as needed for wheezing or shortness of breath. Please take you hydrochlorothiazide as prescribed ( 12.5 mg once daily). Please call your PCP office to get refill sent as soon as possible. One week follow up appointment will be scheduled by [name], LPN. Will need a July 2020 follow up with Dr. [facility] [address] [place], [place]  [id number]   Follow Up Instructions: Follow up Tele Visit 1 week Follow up with Dr. Ander Slade in July 2020   I discussed the assessment and treatment plan with the patient. The patient was provided an opportunity to ask questions and all were answered. The patient agreed with the plan and demonstrated an understanding of the instructions.   The patient was advised to call back or seek an in-person evaluation if the symptoms worsen or if the condition fails to improve as anticipated.  I provided 45 minutes of non-face-to-face time during this encounter.   [name], NP 12/09/2018 3:49 PM

## 2018-12-09 NOTE — ED Triage Notes (Signed)
C/o CP x 3 days-NAD-to triage in w/c

## 2018-12-09 NOTE — Discharge Instructions (Addendum)
Please read instructions below. Follow up with your primary care provider this week regarding your visit today. Try taking antihistamines (allergy medication), and use your albuterol every 4 hours for the tightness in your chest. Continue using the flonase for sinus pressure.  Return to the ER for new or worsening symptoms; including worsening chest pain, shortness of breath not relieved by albuterol, pain that radiates to the arm or neck, or if your symptoms worsen or become concerning in any way.

## 2018-12-10 ENCOUNTER — Ambulatory Visit: Payer: Medicaid Other | Admitting: Physical Therapy

## 2018-12-11 ENCOUNTER — Encounter: Payer: Self-pay | Admitting: Family Medicine

## 2018-12-14 MED ORDER — HYDROCHLOROTHIAZIDE 12.5 MG PO CAPS: 12.5000 mg | ORAL_CAPSULE | Freq: Every day | ORAL | 0 refills | Status: DC

## 2018-12-14 MED ORDER — AMLODIPINE BESYLATE 5 MG PO TABS: 5.0000 mg | ORAL_TABLET | Freq: Every day | ORAL | 0 refills | Status: DC

## 2018-12-16 ENCOUNTER — Telehealth: Payer: Self-pay | Admitting: Physical Therapy

## 2018-12-16 NOTE — Telephone Encounter (Signed)
Kathy Hodge was contacted today regarding transition if in-person OP Rehab Services to telehealth due to Covid-19. Pt consented to telehealth services, educated on MyChart signup, [employer], and was agreeable to receive information via (text/email) regarding telehealth services. Pt consented and was scheduled for appointment. The appointment is scheduled for Friday, April 17th at 10:30am

## 2018-12-17 NOTE — Telephone Encounter (Signed)
Patient had televisit with Gladstone Pih NP on 12/09/2018 to address concerns.  Nothing further needed.  No order placed at this time.  Will close encounter

## 2018-12-18 ENCOUNTER — Ambulatory Visit: Payer: Medicaid Other | Attending: Orthopaedic Surgery | Admitting: Physical Therapy

## 2018-12-18 ENCOUNTER — Encounter: Payer: Self-pay | Admitting: Physical Therapy

## 2018-12-18 DIAGNOSIS — R293 Abnormal posture: Secondary | ICD-10-CM | POA: Insufficient documentation

## 2018-12-18 DIAGNOSIS — M6281 Muscle weakness (generalized): Secondary | ICD-10-CM | POA: Insufficient documentation

## 2018-12-18 DIAGNOSIS — R209 Unspecified disturbances of skin sensation: Secondary | ICD-10-CM | POA: Insufficient documentation

## 2018-12-18 DIAGNOSIS — M25512 Pain in left shoulder: Secondary | ICD-10-CM | POA: Insufficient documentation

## 2018-12-18 DIAGNOSIS — G8929 Other chronic pain: Secondary | ICD-10-CM | POA: Insufficient documentation

## 2018-12-18 DIAGNOSIS — M5441 Lumbago with sciatica, right side: Secondary | ICD-10-CM | POA: Diagnosis present

## 2018-12-18 DIAGNOSIS — M7502 Adhesive capsulitis of left shoulder: Secondary | ICD-10-CM | POA: Diagnosis present
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
## 2018-12-18 NOTE — Patient Instructions (Signed)
<  HTML><META HTTP-EQUIV="content-type" CONTENT="text/html;charset=utf-8"><STRONG>Access Code: [email or web address]       <BR>URL: [email or web address]       <BR><SPAN id="[email or web address]">Date: 12/18/2018</SPAN>                                                     <BR>Prepared by: Selinda Eon                             </STRONG>      <BR><BR><SPAN>Exercises</SPAN>      <UL class="program-note-list"><LI>Doorway Pec Stretch at 60 Degrees Abduction with Arm Straight                 - 2 reps                 - 1 sets                 - 20s hold                         - 2x daily                         - 7x weekly                </LI><LI>Seated Shoulder Flexion Towel Slide at Table Top                 - 10 reps                 - 1 sets                 - 5s hold                         - 2x daily                         - 7x weekly                </LI><LI>Seated Single Arm Shoulder Extension                 - 10 reps                 - 3 sets                         - 2x daily                         - 7x weekly                </LI><LI>Standing Backward Shoulder Rolls                 - 10 reps                 - 1 sets                         - 3x daily                         - 7x weekly </LI></UL>

## 2018-12-24 ENCOUNTER — Ambulatory Visit: Payer: Medicaid Other | Admitting: Physical Therapy

## 2018-12-25 ENCOUNTER — Encounter: Payer: Self-pay | Admitting: Family Medicine

## 2018-12-27 ENCOUNTER — Encounter: Payer: Self-pay | Admitting: Family Medicine

## 2018-12-28 ENCOUNTER — Other Ambulatory Visit: Payer: Self-pay | Admitting: Family Medicine

## 2018-12-28 DIAGNOSIS — R519 Headache, unspecified: Secondary | ICD-10-CM

## 2018-12-28 DIAGNOSIS — R51 Headache: Principal | ICD-10-CM

## 2018-12-28 NOTE — Progress Notes (Signed)
Chronic headaches. Patient request referral to neurology. She is already established with [facility] neurology, will send a referral request to include chronic headaches evaluation request.

## 2018-12-29 ENCOUNTER — Encounter: Payer: Self-pay | Admitting: Family Medicine

## 2018-12-29 ENCOUNTER — Ambulatory Visit: Payer: Medicaid Other | Admitting: Physical Therapy

## 2018-12-29 ENCOUNTER — Other Ambulatory Visit: Payer: Self-pay | Admitting: Family Medicine

## 2018-12-29 ENCOUNTER — Encounter: Payer: Self-pay | Admitting: Physical Therapy

## 2018-12-29 DIAGNOSIS — R293 Abnormal posture: Secondary | ICD-10-CM

## 2018-12-29 DIAGNOSIS — M7502 Adhesive capsulitis of left shoulder: Secondary | ICD-10-CM

## 2018-12-29 DIAGNOSIS — M25512 Pain in left shoulder: Secondary | ICD-10-CM | POA: Diagnosis not present

## 2018-12-29 DIAGNOSIS — G8929 Other chronic pain: Secondary | ICD-10-CM

## 2018-12-29 DIAGNOSIS — M6281 Muscle weakness (generalized): Secondary | ICD-10-CM

## 2018-12-29 MED ORDER — TIZANIDINE HCL 4 MG PO TABS: 4.0000 mg | ORAL_TABLET | Freq: Four times a day (QID) | ORAL | 1 refills | Status: DC | PRN
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
## 2018-12-29 NOTE — Progress Notes (Signed)
Per mychart request refilled Tizanidine

## 2018-12-29 NOTE — Patient Instructions (Signed)
Access Code: [email or web address]  URL: [email or web address]  Date: 12/29/2018  Prepared by: Selinda Eon   Exercises  Seated Cervical Retraction - 10 reps - 1 sets - 2s hold - 3x daily - 7x weekly  3 Finger Cervical Rotation - 10 reps - 3 sets - 1x daily - 7x weekly  Seated Upper Trapezius Stretch - 2 reps - 1 sets - 30s hold - 3x daily - 7x weekly  Gentle Levator Scapulae Stretch - 2 reps - 1 sets - 30s hold - 3x daily - 7x weekly  Sidelying Thoracic Rotation with Open Book - 5 reps - 1 sets - 5s hold - 2x daily - 7x weekly  Seated Transversus Abdominis Bracing - 10 reps - 3 sets - 1x daily - 7x weekly  Seated Abdominal Press into [employer] - 10 reps - 3 sets - 3s hold - 1x daily - 7x weekly

## 2018-12-30 ENCOUNTER — Ambulatory Visit: Payer: Medicaid Other | Admitting: Physical Therapy

## 2018-12-31 ENCOUNTER — Encounter: Payer: Self-pay | Admitting: Family Medicine

## 2019-01-03 ENCOUNTER — Encounter: Payer: Self-pay | Admitting: Physical Therapy

## 2019-01-04 ENCOUNTER — Telehealth: Payer: Self-pay | Admitting: *Deleted

## 2019-01-04 NOTE — Telephone Encounter (Signed)
Received my chart message from patient:  Need to make appt, for a visit, was seen at the hospital a few weeks ago for headaches lasting more than a week. And was given a ct scan and was told to see a neurologist.. Still three weeks alter and still have a constant off and on headache mostly on.. I take all my meds , was taking Tylenol also for it , was advised to continue taking muscle relaxers ass well. Spoke to my dr about it and referred me to see neurologist as well. I replied and advised her Due to current COVID 19 pandemic, our office is severely reducing in person visits in order to minimize the risk to our patients and healthcare providers. We recommend  a video visit. We'll take all precautions to reduce any security or privacy concerns. This will be treated like an office visit, and we will file with your insurance. She agreed; I called her and scheduled for tomorrow , updated EMR. She is familiar with video visits, had no questions.  E mail sent for doxy.me appt tomorrow.

## 2019-01-05 ENCOUNTER — Encounter: Payer: Self-pay | Admitting: Diagnostic Neuroimaging

## 2019-01-05 ENCOUNTER — Other Ambulatory Visit: Payer: Self-pay

## 2019-01-05 ENCOUNTER — Ambulatory Visit (INDEPENDENT_AMBULATORY_CARE_PROVIDER_SITE_OTHER): Payer: Medicaid Other | Admitting: Diagnostic Neuroimaging

## 2019-01-05 DIAGNOSIS — G43109 Migraine with aura, not intractable, without status migrainosus: Secondary | ICD-10-CM | POA: Diagnosis not present

## 2019-01-05 MED ORDER — RIZATRIPTAN BENZOATE 10 MG PO TBDP: 10.0000 mg | ORAL_TABLET | ORAL | 6 refills | Status: DC | PRN

## 2019-01-05 MED ORDER — TOPIRAMATE 50 MG PO TABS: 50.0000 mg | ORAL_TABLET | Freq: Two times a day (BID) | ORAL | 12 refills | Status: DC

## 2019-01-05 NOTE — Progress Notes (Signed)
Virtual Visit via Video Note  I connected with Kathy Hodge on 01/05/19 at 10:30 AM EDT by a video enabled telemedicine application and verified that I am speaking with the correct person using two identifiers.   I discussed the limitations of evaluation and management by telemedicine and the availability of in person appointments. The patient expressed understanding and agreed to proceed.  Patient is at their home. I am at the office.    History of Present Illness:  48 year old female here for evaluation of headaches.  Patient has had headaches since her early 59s with occipital and frontal throbbing and pounding sensation associate with nausea, photophobia, phonophobia.  Sometimes she has pain in her neck that shoots down her spine.  Symptoms are seeing spots and lines.  No specific triggering or aggravating factors.  In the past patient would have only a few headaches per month or 2 headaches per year.  In the last few months headaches have been worsening.  Patient started topiramate milligrams twice a day without relief.  In the last month headaches have significantly worsened.  Patient went to the hospital several times for evaluation.  She had CT scan of the head in urgency room visit which was unremarkable.  Patient referred here for further evaluation.    Observations/Objective:  VIDEO EXAM  GENERAL EXAM/CONSTITUTIONAL:  Vitals: There were no vitals filed for this visit.  There is no height or weight on file to calculate BMI. Wt Readings from Last 3 Encounters:  12/09/18 231 lb (104.8 kg)  11/11/18 234 lb (106.1 kg)  10/29/18 239 lb (108.4 kg)     Patient is in no distress; well developed, nourished and groomed; neck is supple   NEUROLOGIC: MENTAL STATUS:  No flowsheet data found.  awake, alert, oriented to person, place and time  recent and remote memory intact  normal attention and concentration  language fluent, comprehension intact, naming intact  fund  of knowledge appropriate  CRANIAL NERVE:   2nd, 3rd, 4th, 6th - visual fields full to confrontation, extraocular muscles intact, no nystagmus  5th - facial sensation symmetric  7th - facial strength symmetric  8th - hearing intact  11th - shoulder shrug symmetric  12th - tongue protrusion midline  MOTOR:   NO TREMOR; NO DRIFT IN BUE  SENSORY:   normal and symmetric to light touch  COORDINATION:   fine finger movements normal   11/11/18 CT head [I reviewed images myself and agree with interpretation. -VRP]  - normal; brain parenchyma appears unremarkable.  No mass or hemorrhage.  Assessment and Plan:  48 y.o. female with migraine with aura:  Dx:  1. Migraine with aura and without status migrainosus, not intractable     MIGRAINE WITH AURA - MIGRAINE PREVENTION --> increase topiramate 50mg  twice a day; drink plenty of water - MIGRAINE RESCUE --> rizatriptan 10mg  as needed for breakthrough headache; may repeat x 1 after 2 hours; max 2 tabs per day or 8 per month  Meds ordered this encounter  Medications  . topiramate (TOPAMAX) 50 MG tablet    Sig: Take 1 tablet (50 mg total) by mouth 2 (two) times daily.    Dispense:  60 tablet    Refill:  12  . rizatriptan (MAXALT-MLT) 10 MG disintegrating tablet    Sig: Take 1 tablet (10 mg total) by mouth as needed for migraine. May repeat in 2 hours if needed    Dispense:  9 tablet    Refill:  6  Follow Up Instructions:  - Return in about 4 months (around 05/08/2019) for with NP (Amy Lomax).    I discussed the assessment and treatment plan with the patient. The patient was provided an opportunity to ask questions and all were answered. The patient agreed with the plan and demonstrated an understanding of the instructions.   The patient was advised to call back or seek an in-person evaluation if the symptoms worsen or if the condition fails to improve as anticipated.  I provided 25 minutes of non-face-to-face time during  this encounter.   Penni Bombard, MD 0/05/3266, 12:45 AM Certified in Neurology, Neurophysiology and Neuroimaging  [facility] [address], [place], [place] [address] [phone]

## 2019-01-13 ENCOUNTER — Ambulatory Visit: Payer: Medicaid Other | Admitting: Pulmonary Disease

## 2019-01-19 ENCOUNTER — Telehealth: Payer: Self-pay | Admitting: Pulmonary Disease

## 2019-01-19 NOTE — Telephone Encounter (Signed)
Checked with Sharl Ma and Kathlee Nations to see if they might have a copy of pt's HST and neither of them did. Called and spoke with pt to see where and when HST was performed and she stated it was performed about 1-2 years ago in [place]. I found release of information in pt's media tab where we had originally requested the HST to be sent to our office. I have refaxed that to Dr. Derrek Monaco office in [place] so we can obtain the original HST report. Leaving encounter open until the report has been faxed to our office so we can then send it to Dr. Ron Parker.

## 2019-01-21 NOTE — Telephone Encounter (Signed)
Checked Dr. Judson Roch box to see if HST had been received and there was nothing in his box up front on pt.  Called Dr. Derrek Monaco office in [place] at [phone] to speak with someone in the medical records dept to see if they received the release of records information that I had sent them 5/19 for [place] to receive the HST but unable to speak with anyone in medical records. I have left a message for medical records to return call so we can see if they did receive the release or if we need to obtain a different fax number than where I had faxed it. The fax number that I sent the release to was [phone].  Will await a return call.

## 2019-01-26 NOTE — Telephone Encounter (Signed)
I found the records up front I will fax over then put them back in Dr Judson Roch box for review

## 2019-02-16 ENCOUNTER — Encounter: Payer: Self-pay | Admitting: Family Medicine

## 2019-02-18 ENCOUNTER — Other Ambulatory Visit: Payer: Self-pay | Admitting: *Deleted

## 2019-02-18 ENCOUNTER — Telehealth: Payer: Self-pay | Admitting: Diagnostic Neuroimaging

## 2019-02-18 DIAGNOSIS — R519 Headache, unspecified: Secondary | ICD-10-CM

## 2019-02-18 DIAGNOSIS — G43109 Migraine with aura, not intractable, without status migrainosus: Secondary | ICD-10-CM

## 2019-02-18 NOTE — Telephone Encounter (Signed)
Medicaid order sent to GI. They will obtain the auth and reach out to the patient to schedule.  

## 2019-02-23 ENCOUNTER — Other Ambulatory Visit: Payer: Self-pay | Admitting: Pulmonary Disease

## 2019-02-23 NOTE — Telephone Encounter (Signed)
Is this appropriate for refill ? 

## 2019-02-24 ENCOUNTER — Encounter: Payer: Self-pay | Admitting: Family Medicine

## 2019-02-24 ENCOUNTER — Other Ambulatory Visit: Payer: Self-pay | Admitting: Family Medicine

## 2019-02-24 MED ORDER — TIZANIDINE HCL 4 MG PO TABS: 4.0000 mg | ORAL_TABLET | Freq: Four times a day (QID) | ORAL | 1 refills | Status: DC | PRN

## 2019-02-24 NOTE — Progress Notes (Signed)
Refilled Tizanidine

## 2019-02-25 ENCOUNTER — Telehealth: Payer: Self-pay | Admitting: *Deleted

## 2019-02-25 NOTE — Telephone Encounter (Signed)
Re: my chart messages- ongoing headache. Spoke with patient and advised her Dr Leta Baptist stated he can order a prednisone dose pack for her to take over the weekend. Or she may cone into office Mon for migraine infusion. She stated she doesn't want to take steroids that will lower her immune system. She will call Mon morning to let me know if she wants to come in for infusion. Patient verbalized understanding, appreciation.

## 2019-03-01 MED ORDER — PREDNISONE 10 MG PO TABS: ORAL_TABLET | ORAL | 0 refills | Status: DC

## 2019-03-01 NOTE — Addendum Note (Signed)
Addended by: Andrey Spearman R on: 03/01/2019 05:28 PM   Modules accepted: Orders

## 2019-03-01 NOTE — Telephone Encounter (Signed)
Prednisone pack sent in 

## 2019-03-01 NOTE — Telephone Encounter (Addendum)
Received call back from patient. She has not listened to VM, so I reviewed it with her. She has concerns about her immune system, stating she has asthma. She stated she isn't going out but her children are. She stated she's not sure it is a migraine, but her headache will not go away. She  stated she would like a short course of steroids. I advised her I'll let Dr Leta Baptist know.  Patient verbalized understanding, appreciation.

## 2019-03-01 NOTE — Telephone Encounter (Addendum)
Received my chart: Afternoon I was asked did I want to try a two different treatment for these headaches, one was steroids and the integer was to come int the office for an once a month treatment. I'm ready to try one of them because , I'm still having these bad headaches to where it's pain to the touch of my scalp or temples. So please I need something done ASAP.  Left detailed VM for patient advising her of our conversation on Thursday. I advised she call back and let me know whether she decided she wants Dr Leta Baptist to prescribe steroids or whether she wants an infusion. I advised we need to know ahead of time if she wants infusion so the RN in infusion room can schedule, and Dr Leta Baptist can write the orders. I left office #.

## 2019-03-08 ENCOUNTER — Encounter: Payer: Self-pay | Admitting: Family Medicine

## 2019-03-08 ENCOUNTER — Other Ambulatory Visit: Payer: Self-pay

## 2019-03-08 ENCOUNTER — Other Ambulatory Visit (INDEPENDENT_AMBULATORY_CARE_PROVIDER_SITE_OTHER): Payer: 59

## 2019-03-08 DIAGNOSIS — Z1389 Encounter for screening for other disorder: Secondary | ICD-10-CM

## 2019-03-08 DIAGNOSIS — R399 Unspecified symptoms and signs involving the genitourinary system: Secondary | ICD-10-CM

## 2019-03-08 LAB — POCT URINALYSIS DIP (CLINITEK)
Bilirubin, UA: NEGATIVE
Glucose, UA: NEGATIVE mg/dL
Leukocytes, UA: NEGATIVE
Nitrite, UA: NEGATIVE
POC PROTEIN,UA: NEGATIVE
Spec Grav, UA: >=1.03 — AB (ref 1.010–1.025)
Urobilinogen, UA: 1 E.U./dL
pH, UA: 5.5 (ref 5.0–8.0)

## 2019-03-08 MED ORDER — ZOLPIDEM TARTRATE 10 MG PO TABS: 10.0000 mg | ORAL_TABLET | Freq: Every day | ORAL | 3 refills | Status: DC

## 2019-03-08 NOTE — Addendum Note (Signed)
Addended by: Carylon Perches on: 03/08/2019 04:18 PM   Modules accepted: Orders

## 2019-03-08 NOTE — Progress Notes (Signed)
Patient came to leave urine sample prior to her telehealth visit on 03/10/2019. KWalker, CMA.

## 2019-03-08 NOTE — Telephone Encounter (Signed)
This message was sent to Dr. Ander Slade.

## 2019-03-08 NOTE — Telephone Encounter (Signed)
Sent in

## 2019-03-08 NOTE — Telephone Encounter (Signed)
Good morning, Kathy Hodge. Pt sent the following MyChart message:  "details. Action needed ZOLPIDEM 10MG  TABLETS      Need refill on this med pharmacy contacted me this morning"  Pt prescribed Ambien 10 mg 1 tab qhs prn on 10/13/2018 30 tabs x3 refills. Pt last seen 10/13/2018 by you for OSA & Insomnia disorder Pt is requesting a refill for this medication.  Current Outpatient Medications on File Prior to Visit  Medication Sig Dispense Refill  . albuterol (PROVENTIL HFA;VENTOLIN HFA) 108 (90 Base) MCG/ACT inhaler Inhale 1-2 puffs into the lungs every 6 (six) hours as needed for wheezing or shortness of breath. 8 g 2  . amLODipine (NORVASC) 5 MG tablet Take 1 tablet (5 mg total) by mouth daily. 90 tablet 0  . fexofenadine-pseudoephedrine (ALLEGRA-D) 60-120 MG 12 hr tablet Take 1 tablet by mouth 2 (two) times daily.    . fluticasone (FLONASE) 50 MCG/ACT nasal spray Place 2 sprays into both nostrils daily. 16 g 6  . Fluticasone-Salmeterol 113-14 MCG/ACT AEPB Inhale 1 puff into the lungs 2 (two) times daily. 1 each 3  . gabapentin (NEURONTIN) 600 MG tablet Take 600 mg by mouth 3 (three) times daily.    . hydrochlorothiazide (MICROZIDE) 12.5 MG capsule Take 1 capsule (12.5 mg total) by mouth daily. 90 capsule 0  . montelukast (SINGULAIR) 10 MG tablet Take 1 tablet (10 mg total) by mouth at bedtime. 90 tablet 2  . oxyCODONE-acetaminophen (PERCOCET) 10-325 MG tablet Take 1 tablet by mouth every 4 (four) hours as needed for pain.    . predniSONE (DELTASONE) 10 MG tablet Take 60mg  on day 1. Reduce by 10mg  each subsequent day. (60, 50, 40, 30, 20, 10, stop) 21 tablet 0  . rizatriptan (MAXALT-MLT) 10 MG disintegrating tablet Take 1 tablet (10 mg total) by mouth as needed for migraine. May repeat in 2 hours if needed 9 tablet 6  . tiZANidine (ZANAFLEX) 4 MG tablet Take 1 tablet (4 mg total) by mouth every 6 (six) hours as needed for muscle spasms. 60 tablet 1  . topiramate (TOPAMAX) 50 MG tablet Take 1 tablet (50 mg  total) by mouth 2 (two) times daily. 60 tablet 12  . zolpidem (AMBIEN) 10 MG tablet Take 1 tablet (10 mg total) by mouth at bedtime as needed for sleep. 30 tablet 3   No current facility-administered medications on file prior to visit.    Allergies  Allergen Reactions  . Peanut-Containing Drug Products Anaphylaxis    Please advise on the refill of this medication. As I was trying to pend an order, a pop-up stated the medication is already pending. Thank you.

## 2019-03-08 NOTE — Telephone Encounter (Signed)
Sent MyChart message to pt letting her know her prescription for Lorrin Mais has been sent to her preferred pharmacy. Nothing further needed at this time.

## 2019-03-09 ENCOUNTER — Encounter (HOSPITAL_BASED_OUTPATIENT_CLINIC_OR_DEPARTMENT_OTHER): Payer: Self-pay

## 2019-03-09 ENCOUNTER — Telehealth: Payer: Self-pay | Admitting: *Deleted

## 2019-03-09 ENCOUNTER — Encounter: Payer: Self-pay | Admitting: Family Medicine

## 2019-03-09 ENCOUNTER — Emergency Department (HOSPITAL_BASED_OUTPATIENT_CLINIC_OR_DEPARTMENT_OTHER)
Admission: EM | Admit: 2019-03-09 | Discharge: 2019-03-09 | Disposition: A | Discharge status: Home / Self Care | Payer: 59 | Attending: Emergency Medicine | Admitting: Emergency Medicine

## 2019-03-09 ENCOUNTER — Other Ambulatory Visit: Payer: Self-pay

## 2019-03-09 ENCOUNTER — Emergency Department (HOSPITAL_BASED_OUTPATIENT_CLINIC_OR_DEPARTMENT_OTHER): Payer: 59

## 2019-03-09 DIAGNOSIS — Z79899 Other long term (current) drug therapy: Secondary | ICD-10-CM | POA: Insufficient documentation

## 2019-03-09 DIAGNOSIS — G43909 Migraine, unspecified, not intractable, without status migrainosus: Secondary | ICD-10-CM | POA: Insufficient documentation

## 2019-03-09 DIAGNOSIS — I10 Essential (primary) hypertension: Secondary | ICD-10-CM | POA: Insufficient documentation

## 2019-03-09 DIAGNOSIS — Z91010 Allergy to peanuts: Secondary | ICD-10-CM | POA: Diagnosis not present

## 2019-03-09 DIAGNOSIS — J45909 Unspecified asthma, uncomplicated: Secondary | ICD-10-CM | POA: Diagnosis not present

## 2019-03-09 DIAGNOSIS — Z87891 Personal history of nicotine dependence: Secondary | ICD-10-CM | POA: Diagnosis not present

## 2019-03-09 DIAGNOSIS — R0789 Other chest pain: Secondary | ICD-10-CM | POA: Diagnosis not present

## 2019-03-09 DIAGNOSIS — R079 Chest pain, unspecified: Secondary | ICD-10-CM | POA: Diagnosis present

## 2019-03-09 LAB — BASIC METABOLIC PANEL
Anion gap: 11 (ref 5–15)
BUN: 11 mg/dL (ref 6–20)
CO2: 23 mmol/L (ref 22–32)
Calcium: 9.3 mg/dL (ref 8.9–10.3)
Chloride: 105 mmol/L (ref 98–111)
Creatinine, Ser: 0.84 mg/dL (ref 0.44–1.00)
GFR calc Af Amer: >60 mL/min (ref >60)
GFR calc non Af Amer: >60 mL/min (ref >60)
Glucose, Bld: 95 mg/dL (ref 70–99)
Potassium: 3.2 mmol/L — ABNORMAL LOW (ref 3.5–5.1)
Sodium: 139 mmol/L (ref 135–145)

## 2019-03-09 LAB — CBC
HCT: 42.2 % (ref 36.0–46.0)
Hemoglobin: 13.4 g/dL (ref 12.0–15.0)
MCH: 27.7 pg (ref 26.0–34.0)
MCHC: 31.8 g/dL (ref 30.0–36.0)
MCV: 87.2 fL (ref 80.0–100.0)
Platelets: 307 10*3/uL (ref 150–400)
RBC: 4.84 MIL/uL (ref 3.87–5.11)
RDW: 12.5 % (ref 11.5–15.5)
WBC: 6 10*3/uL (ref 4.0–10.5)
nRBC: 0 % (ref 0.0–0.2)

## 2019-03-09 LAB — D-DIMER, QUANTITATIVE: D-Dimer, Quant: 0.41 ug/mL-FEU (ref 0.00–0.50)

## 2019-03-09 LAB — URINE CULTURE

## 2019-03-09 LAB — TROPONIN I (HIGH SENSITIVITY): Troponin I (High Sensitivity): 3 ng/L (ref <18)

## 2019-03-09 MED ORDER — ALBUTEROL SULFATE HFA 108 (90 BASE) MCG/ACT IN AERS
2.0000 | INHALATION_SPRAY | Freq: Once | RESPIRATORY_TRACT | Status: AC
  Administered 2019-03-09: 2 via RESPIRATORY_TRACT
  Filled 2019-03-09: qty 6.7

## 2019-03-09 MED ORDER — POTASSIUM CHLORIDE CRYS ER 20 MEQ PO TBCR
40.0000 meq | EXTENDED_RELEASE_TABLET | Freq: Once | ORAL | Status: AC
  Administered 2019-03-09: 40 meq via ORAL
  Filled 2019-03-09: qty 2

## 2019-03-09 MED ORDER — DIPHENHYDRAMINE HCL 50 MG/ML IJ SOLN
25.0000 mg | Freq: Once | INTRAMUSCULAR | Status: AC
  Administered 2019-03-09: 25 mg via INTRAVENOUS
  Filled 2019-03-09: qty 1

## 2019-03-09 MED ORDER — ASPIRIN 325 MG PO TABS
325.0000 mg | ORAL_TABLET | Freq: Once | ORAL | Status: AC
  Administered 2019-03-09: 325 mg via ORAL
  Filled 2019-03-09: qty 1

## 2019-03-09 MED ORDER — SODIUM CHLORIDE 0.9 % IV BOLUS
500.0000 mL | Freq: Once | INTRAVENOUS | Status: AC
  Administered 2019-03-09: 500 mL via INTRAVENOUS

## 2019-03-09 MED ORDER — SODIUM CHLORIDE 0.9% FLUSH
3.0000 mL | Freq: Once | INTRAVENOUS | Status: DC
  Filled 2019-03-09: qty 3

## 2019-03-09 MED ORDER — METOCLOPRAMIDE HCL 5 MG/ML IJ SOLN
10.0000 mg | Freq: Once | INTRAMUSCULAR | Status: AC
  Administered 2019-03-09: 10 mg via INTRAVENOUS
  Filled 2019-03-09: qty 2

## 2019-03-09 MED ORDER — KETOROLAC TROMETHAMINE 30 MG/ML IJ SOLN
30.0000 mg | Freq: Once | INTRAMUSCULAR | Status: AC
  Administered 2019-03-09: 30 mg via INTRAVENOUS
  Filled 2019-03-09: qty 1
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
## 2019-03-09 NOTE — ED Notes (Signed)
Pt states pain from chest radiates into back

## 2019-03-09 NOTE — Discharge Instructions (Signed)
Work-up today is reassuring and does not suggest an acute problem with your heart or lungs.  I suggest that you may be having a mild flareup of your asthma, please continue using your nebulizer treatments or inhaler every 4-6 hours for the next 1 to 2 days then use as needed.  I would also recommend that you start taking an antihistamine such as Zyrtec.  Continue to follow-up with your PCP, as well as your neurologist regarding your migraines.  Return to the emergency department if you have new or worsening chest pain especially if it radiates to the neck or arm, or is worse with exertion, shortness of breath or lightheadedness.

## 2019-03-09 NOTE — ED Notes (Signed)
Pt. Called for a ride home and he daughter will come and get her.  Pt. Reports she is not allergic to any med.

## 2019-03-09 NOTE — ED Triage Notes (Signed)
Pt c/o CP x 5 days-NAD-steady gait

## 2019-03-09 NOTE — Telephone Encounter (Signed)
Spoke with patient to discuss her my chart messages. Her UA is pending with PCP, and she will register for Covid 19 test this coming Sat with her PCP. She reports aching pains in her arms and back, some SOB, stuffy nose. She has  asthma and sinus issues; she has pulse ox and checks her heart rate, O2 when she doesn't feel well. She has taken Pepcid AC for feelings of gas, Percocet for recent hand surgery, is taking topiramate 50 mg twice daily for headaches but without relief.  She stated that since she often has similar symptoms due to her other health issues, she isn't sure if she has Covid 19 or if it is something else.  Her ongoing headache is worrisome to her. I advised she call PCP for UA results if she's not heard back in 36-48 hours. I advised she monitor her symptoms and if SOB worsens or she develops a fever she call PCP right away. As to her increased heart rate we discussed that her anxiety may be the cause of that happening.  I advised I'll update Dr Leta Baptist and call her if he has any recommendations re: her ongoing headache. She verbalized understanding, appreciation.

## 2019-03-10 ENCOUNTER — Ambulatory Visit (INDEPENDENT_AMBULATORY_CARE_PROVIDER_SITE_OTHER): Payer: 59 | Admitting: Family Medicine

## 2019-03-10 DIAGNOSIS — N3281 Overactive bladder: Secondary | ICD-10-CM | POA: Diagnosis not present

## 2019-03-10 DIAGNOSIS — R3914 Feeling of incomplete bladder emptying: Secondary | ICD-10-CM | POA: Diagnosis not present

## 2019-03-10 DIAGNOSIS — R350 Frequency of micturition: Secondary | ICD-10-CM

## 2019-03-10 MED ORDER — OXYBUTYNIN CHLORIDE ER 5 MG PO TB24: 5.0000 mg | ORAL_TABLET | Freq: Every day | ORAL | 2 refills | Status: DC

## 2019-03-10 NOTE — Progress Notes (Signed)
Virtual Visit via Telephone Note  I connected with Kathy Hodge on 03/10/19 at  4:10 PM EDT by telephone and verified that I am speaking with the correct person using two identifiers.  Location: Patient: Located at home during today's encounter  Provider: Located at primary care office     I discussed the limitations, risks, security and privacy concerns of performing an evaluation and management service by telephone and the availability of in person appointments. I also discussed with the patient that there may be a patient responsible charge related to this service. The patient expressed understanding and agreed to proceed.   History of Present Illness: Kathy Hodge is present today for an acute encounter with a complaint of urine retention and urine frequency. Reports a history of overactive bladder in which she was previously treated with medication [place]. Endorses urine frequency occurring  6 or 7 times per night. Symptoms present during the day although more severe at nighttime.  No history of diabetes or recurrent urinary tract infections.  Recent UA was negative along with urine culture was also negative that patient provided to the office earlier this week prior to visit.  She denies chills, nausea, vomiting, bloating.  Symptoms are present regardless of fluid intake. Assessment and Plan: Urine frequency - Plan: Ambulatory referral to Urology,  Feeling of incomplete bladder emptying - Plan: Ambulatory referral to Urology,  Overactive bladder - Plan: Ambulatory referral to Urology,  Will trial oxybutynin 5 mg once daily.  Patient has also been referred to urology.   Follow Up Instructions: Schedule blood pressure follow-up.   I discussed the assessment and treatment plan with the patient. The patient was provided an opportunity to ask questions and all were answered. The patient agreed with the plan and demonstrated an understanding of the instructions.   The  patient was advised to call back or seek an in-person evaluation if the symptoms worsen or if the condition fails to improve as anticipated.  I provided 20  minutes of non-face-to-face time during this encounter.   Molli Barrows, FNP

## 2019-03-18 ENCOUNTER — Other Ambulatory Visit: Payer: Self-pay

## 2019-03-18 ENCOUNTER — Ambulatory Visit
Admission: RE | Admit: 2019-03-18 | Discharge: 2019-03-18 | Disposition: A | Discharge status: Home / Self Care | Payer: Medicaid Other | Source: Ambulatory Visit | Attending: Diagnostic Neuroimaging | Admitting: Diagnostic Neuroimaging

## 2019-03-18 DIAGNOSIS — R519 Headache, unspecified: Secondary | ICD-10-CM

## 2019-03-18 DIAGNOSIS — G43109 Migraine with aura, not intractable, without status migrainosus: Secondary | ICD-10-CM

## 2019-03-18 DIAGNOSIS — R51 Headache: Secondary | ICD-10-CM

## 2019-03-18 MED ORDER — GADOBENATE DIMEGLUMINE 529 MG/ML IV SOLN
20.0000 mL | Freq: Once | INTRAVENOUS | Status: AC | PRN
  Administered 2019-03-18: 20 mL via INTRAVENOUS

## 2019-03-22 ENCOUNTER — Telehealth: Payer: Self-pay

## 2019-03-22 NOTE — Telephone Encounter (Signed)
Notes recorded by Penni Bombard, MD on 03/19/2019 at 2:35 PM EDT  Unremarkable imaging results. Please call patient. Continue current plan. -VRP  Pt notified of results and verbalized understanding. Pt has been scheduled for her 4 month f/u with AL, NP

## 2019-03-24 ENCOUNTER — Other Ambulatory Visit: Payer: Self-pay | Admitting: Pulmonary Disease

## 2019-03-24 NOTE — Addendum Note (Signed)
Addended by: Minna Antis on: 03/24/2019 11:37 AM   Modules accepted: Orders

## 2019-03-25 ENCOUNTER — Other Ambulatory Visit: Payer: Self-pay

## 2019-03-25 ENCOUNTER — Other Ambulatory Visit: Payer: Self-pay | Admitting: Neurology

## 2019-03-25 MED ORDER — AMLODIPINE BESYLATE 5 MG PO TABS: 5.0000 mg | ORAL_TABLET | Freq: Every day | ORAL | 0 refills | Status: DC

## 2019-03-25 MED ORDER — HYDROCHLOROTHIAZIDE 12.5 MG PO CAPS: 12.5000 mg | ORAL_CAPSULE | Freq: Every day | ORAL | 0 refills | Status: DC

## 2019-03-25 MED ORDER — VENLAFAXINE HCL ER 37.5 MG PO CP24: ORAL_CAPSULE | ORAL | 1 refills | Status: DC

## 2019-03-26 ENCOUNTER — Encounter: Payer: Self-pay | Admitting: Family Medicine

## 2019-04-13 ENCOUNTER — Telehealth: Payer: Self-pay | Admitting: Family Medicine

## 2019-04-13 NOTE — Telephone Encounter (Signed)
Patient needs an appointment

## 2019-04-13 NOTE — Telephone Encounter (Signed)
Patient want to see a allergy Doctor

## 2019-04-14 ENCOUNTER — Inpatient Hospital Stay
Admission: RE | Admit: 2019-04-14 | Discharge: 2019-04-14 | Disposition: A | Discharge status: Home / Self Care | Payer: 59 | Source: Ambulatory Visit

## 2019-04-14 ENCOUNTER — Other Ambulatory Visit: Payer: Self-pay | Admitting: Nurse Practitioner

## 2019-04-14 ENCOUNTER — Telehealth: Payer: 59 | Admitting: Nurse Practitioner

## 2019-04-14 DIAGNOSIS — Z0182 Encounter for allergy testing: Secondary | ICD-10-CM

## 2019-04-14 DIAGNOSIS — J4520 Mild intermittent asthma, uncomplicated: Secondary | ICD-10-CM

## 2019-04-20 ENCOUNTER — Other Ambulatory Visit: Payer: Self-pay

## 2019-04-20 ENCOUNTER — Ambulatory Visit (HOSPITAL_COMMUNITY)
Admission: RE | Admit: 2019-04-20 | Discharge: 2019-04-20 | Disposition: A | Discharge status: Home / Self Care | Payer: 59 | Source: Ambulatory Visit | Attending: Internal Medicine | Admitting: Internal Medicine

## 2019-04-20 ENCOUNTER — Ambulatory Visit (INDEPENDENT_AMBULATORY_CARE_PROVIDER_SITE_OTHER): Payer: 59 | Admitting: Internal Medicine

## 2019-04-20 VITALS — BP 144/77 | HR 50 | Temp 98.4°F | Ht 70.0 in | Wt 242.1 lb

## 2019-04-20 DIAGNOSIS — M549 Dorsalgia, unspecified: Secondary | ICD-10-CM | POA: Diagnosis not present

## 2019-04-20 DIAGNOSIS — Z87891 Personal history of nicotine dependence: Secondary | ICD-10-CM

## 2019-04-20 DIAGNOSIS — I10 Essential (primary) hypertension: Secondary | ICD-10-CM

## 2019-04-20 DIAGNOSIS — J45909 Unspecified asthma, uncomplicated: Secondary | ICD-10-CM

## 2019-04-20 DIAGNOSIS — Z79899 Other long term (current) drug therapy: Secondary | ICD-10-CM

## 2019-04-20 DIAGNOSIS — R001 Bradycardia, unspecified: Secondary | ICD-10-CM | POA: Insufficient documentation

## 2019-04-20 DIAGNOSIS — R350 Frequency of micturition: Secondary | ICD-10-CM | POA: Diagnosis not present

## 2019-04-20 DIAGNOSIS — Z79891 Long term (current) use of opiate analgesic: Secondary | ICD-10-CM

## 2019-04-20 DIAGNOSIS — Z8739 Personal history of other diseases of the musculoskeletal system and connective tissue: Secondary | ICD-10-CM

## 2019-04-20 DIAGNOSIS — G43909 Migraine, unspecified, not intractable, without status migrainosus: Secondary | ICD-10-CM

## 2019-04-20 DIAGNOSIS — G8929 Other chronic pain: Secondary | ICD-10-CM

## 2019-04-20 NOTE — Patient Instructions (Addendum)
Ms. Clements,   It was a pleasure seeing you here in the clinic today.  I am glad you have been able to get plugged in with the urologist and also the pain medicine doctor.  As we discussed, your heart rate was low and part of this could be due to some of your medications.  Moving forward, we will get you plugged in to see a primary doctor here which you see on a routine basis.  Take care! Dr. Eileen Stanford  Please call the internal medicine center clinic if you have any questions or concerns, we may be able to help and keep you from a long and expensive emergency room wait. Our clinic and after hours phone number is [phone], the best time to call is Monday through Friday 9 am to 4 pm but there is always someone available 24/7 if you have an emergency. If you need medication refills please notify your pharmacy one week in advance and they will send [place] a request.

## 2019-04-20 NOTE — Assessment & Plan Note (Signed)
  Sinus bradycardia: She was noted to have heart rate of 49 with repeat of 50.  Per chart review, her heart rate has been in the 50s- 80s.  She is completely asymptomatic and denies chest pain, shortness of breath, headaches, lightheadedness or dizziness.  She does have chronic back pain for which she sees pain management and takes oxycodone-acetaminophen 10-325 mg every 4 hours as needed.  She is also on several centrally acting medications including Topamax and oxybutynin which could be contributing to her sinus bradycardia.  She states that she has a pulse oximetry monitor at home and her heart rate is usually in the 60s.  EKG performed in the clinic showed sinus bradycardia.  Plan: - Continue to monitor - Started educating patient regarding bradycardia is possible relation to her chronic Percocet use.
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
## 2019-04-20 NOTE — Assessment & Plan Note (Signed)
Urinary frequency: Kathy Hodge reports that since March of this year she has been experiencing urinary frequency and has been evaluated by [facility].  She tells me that work-up including urinalysis, wet mount and STD screening have all been unremarkable.  She was started on oxybutynin however her symptoms have not really improved and reports she has follow-up with urology.  Given that she admits to a history of overactive bladder, I believe following up with urology is the right call.  Plan: - Advised to continue regular follow-up with urology -We will obtain records from [facility]

## 2019-04-20 NOTE — Addendum Note (Signed)
Addended by: Jean Rosenthal on: 04/20/2019 05:28 PM   Modules accepted: Level of Service

## 2019-04-21 NOTE — Assessment & Plan Note (Signed)
Hypertension-uncontrolled: She admits to inconsistently taking her blood pressure medications.  On chart review, her blood pressure has ranged between 130s-160s/60s-100  BP Readings from Last 3 Encounters:  04/20/19 (!) 144/77  03/09/19 134/87  12/09/18 117/62   Plan: - Advised on medication adherence -Continue amlodipine 5 mg daily and hydrochlorothiazide 12.5 mg daily

## 2019-04-22 NOTE — Progress Notes (Signed)
Internal Medicine Clinic Attending  Case discussed with Dr. Agyei at the time of the visit.  We reviewed the resident's history and exam and pertinent patient test results.  I agree with the assessment, diagnosis, and plan of care documented in the resident's note.    

## 2019-05-03 ENCOUNTER — Other Ambulatory Visit: Payer: Self-pay | Admitting: *Deleted

## 2019-05-03 ENCOUNTER — Encounter: Payer: Self-pay | Admitting: Internal Medicine

## 2019-05-03 ENCOUNTER — Ambulatory Visit: Payer: 59 | Admitting: Allergy
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
## 2019-05-04 MED ORDER — TIZANIDINE HCL 4 MG PO TABS: 4.0000 mg | ORAL_TABLET | Freq: Four times a day (QID) | ORAL | 1 refills | Status: DC | PRN
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
## 2019-05-12 ENCOUNTER — Encounter: Payer: Self-pay | Admitting: Family Medicine

## 2019-05-12 ENCOUNTER — Other Ambulatory Visit: Payer: Self-pay

## 2019-05-12 ENCOUNTER — Ambulatory Visit (INDEPENDENT_AMBULATORY_CARE_PROVIDER_SITE_OTHER): Payer: 59 | Admitting: Family Medicine

## 2019-05-12 VITALS — BP 128/60 | HR 52 | Temp 97.8°F | Ht 70.0 in | Wt 239.8 lb

## 2019-05-12 DIAGNOSIS — G4730 Sleep apnea, unspecified: Secondary | ICD-10-CM | POA: Diagnosis not present

## 2019-05-12 DIAGNOSIS — G43109 Migraine with aura, not intractable, without status migrainosus: Secondary | ICD-10-CM | POA: Diagnosis not present

## 2019-05-12 MED ORDER — TOPIRAMATE 100 MG PO TABS: 100.0000 mg | ORAL_TABLET | Freq: Two times a day (BID) | ORAL | 3 refills | Status: DC

## 2019-05-12 MED ORDER — SUMATRIPTAN SUCCINATE 100 MG PO TABS: 100.0000 mg | ORAL_TABLET | Freq: Once | ORAL | 2 refills | Status: DC | PRN

## 2019-05-12 NOTE — Patient Instructions (Signed)
We will increase topiramate to 100mg  twice daily  We will switch rizatriptan to sumatriptan (1 tablet at onset on migraine, may repeat 1 tablet in 2 hours, no more than 2 tablet in 24 hours, no more than 9 per month)  Please increase water intake, I recommend at (4) 16ounce bottles daily  Follow up in 6 months    Migraine Headache A migraine headache is a very strong throbbing pain on one side or both sides of your head. This type of headache can also cause other symptoms. It can last from 4 hours to 3 days. Talk with your doctor about what things may bring on (trigger) this condition. What are the causes? The exact cause of this condition is not known. This condition may be triggered or caused by:  Drinking alcohol.  Smoking.  Taking medicines, such as: ? Medicine used to treat chest pain (nitroglycerin). ? Birth control pills. ? Estrogen. ? Some blood pressure medicines.  Eating or drinking certain products.  Doing physical activity. Other things that may trigger a migraine headache include:  Having a menstrual period.  Pregnancy.  Hunger.  Stress.  Not getting enough sleep or getting too much sleep.  Weather changes.  Tiredness (fatigue). What increases the risk?  Being 62-69 years old.  Being female.  Having a family history of migraine headaches.  Being Caucasian.  Having depression or anxiety.  Being very overweight. What are the signs or symptoms?  A throbbing pain. This pain may: ? Happen in any area of the head, such as on one side or both sides. ? Make it hard to do daily activities. ? Get worse with physical activity. ? Get worse around bright lights or loud noises.  Other symptoms may include: ? Feeling sick to your stomach (nauseous). ? Vomiting. ? Dizziness. ? Being sensitive to bright lights, loud noises, or smells.  Before you get a migraine headache, you may get warning signs (an aura). An aura may include: ? Seeing flashing  lights or having blind spots. ? Seeing bright spots, halos, or zigzag lines. ? Having tunnel vision or blurred vision. ? Having numbness or a tingling feeling. ? Having trouble talking. ? Having weak muscles.  Some people have symptoms after a migraine headache (postdromal phase), such as: ? Tiredness. ? Trouble thinking (concentrating). How is this treated?  Taking medicines that: ? Relieve pain. ? Relieve the feeling of being sick to your stomach. ? Prevent migraine headaches.  Treatment may also include: ? Having acupuncture. ? Avoiding foods that bring on migraine headaches. ? Learning ways to control your body functions (biofeedback). ? Therapy to help you know and deal with negative thoughts (cognitive behavioral therapy). Follow these instructions at home: Medicines  Take over-the-counter and prescription medicines only as told by your doctor.  Ask your doctor if the medicine prescribed to you: ? Requires you to avoid driving or using heavy machinery. ? Can cause trouble pooping (constipation). You may need to take these steps to prevent or treat trouble pooping:  Drink enough fluid to keep your pee (urine) pale yellow.  Take over-the-counter or prescription medicines.  Eat foods that are high in fiber. These include beans, whole grains, and fresh fruits and vegetables.  Limit foods that are high in fat and sugar. These include fried or sweet foods. Lifestyle  Do not drink alcohol.  Do not use any products that contain nicotine or tobacco, such as cigarettes, e-cigarettes, and chewing tobacco. If you need help quitting, ask your doctor.  Get at least 8 hours of sleep every night.  Limit and deal with stress. General instructions      Keep a journal to find out what may bring on your migraine headaches. For example, write down: ? What you eat and drink. ? How much sleep you get. ? Any change in what you eat or drink. ? Any change in your medicines.  If  you have a migraine headache: ? Avoid things that make your symptoms worse, such as bright lights. ? It may help to lie down in a dark, quiet room. ? Do not drive or use heavy machinery. ? Ask your doctor what activities are safe for you.  Keep all follow-up visits as told by your doctor. This is important. Contact a doctor if:  You get a migraine headache that is different or worse than others you have had.  You have more than 15 headache days in one month. Get help right away if:  Your migraine headache gets very bad.  Your migraine headache lasts longer than 72 hours.  You have a fever.  You have a stiff neck.  You have trouble seeing.  Your muscles feel weak or like you cannot control them.  You start to lose your balance a lot.  You start to have trouble walking.  You pass out (faint).  You have a seizure. Summary  A migraine headache is a very strong throbbing pain on one side or both sides of your head. These headaches can also cause other symptoms.  This condition may be treated with medicines and changes to your lifestyle.  Keep a journal to find out what may bring on your migraine headaches.  Contact a doctor if you get a migraine headache that is different or worse than others you have had.  Contact your doctor if you have more than 15 headache days in a month. This information is not intended to replace advice given to you by your health care provider. Make sure you discuss any questions you have with your health care provider. Document Released: 05/28/2008 Document Revised: 12/11/2018 Document Reviewed: 10/01/2018 Elsevier Patient Education  [place].   Analgesic Rebound Headache An analgesic rebound headache, sometimes called a medication overuse headache, is a headache that comes after pain medicine (analgesic) taken to treat the original (primary) headache has worn off. Any type of primary headache can return as a rebound headache if a  person regularly takes analgesics more than three times a week to treat it. The types of primary headaches that are commonly associated with rebound headaches include:  Migraines.  Headaches that arise from tense muscles in the head and neck area (tension headaches).  Headaches that develop and happen again (recur) on one side of the head and around the eye (cluster headaches). If rebound headaches continue, they become chronic daily headaches. What are the causes? This condition may be caused by frequent use of:  Over-the-counter medicines such as aspirin, ibuprofen, and acetaminophen.  Sinus relief medicines and other medicines that contain caffeine.  Narcotic pain medicines such as codeine and oxycodone. What are the signs or symptoms? The symptoms of a rebound headache are the same as the symptoms of the original headache. Some of the symptoms of specific types of headaches include: Migraine headache  Pulsing or throbbing pain on one or both sides of the head.  Severe pain that interferes with daily activities.  Pain that is worsened by physical activity.  Nausea, vomiting, or both.  Pain with  exposure to bright light, loud noises, or strong smells.  General sensitivity to bright light, loud noises, or strong smells.  Visual changes.  Numbness of one or both arms. Tension headache  Pressure around the head.  Dull, aching head pain.  Pain felt over the front and sides of the head.  Tenderness in the muscles of the head, neck, and shoulders. Cluster headache  Severe pain that begins in or around one eye or temple.  Redness and tearing in the eye on the same side as the pain.  Droopy or swollen eyelid.  One-sided head pain.  Nausea.  Runny nose.  Sweaty, pale facial skin.  Restlessness. How is this diagnosed? This condition is diagnosed by:  Reviewing your medical history. This includes the nature of your primary headaches.  Reviewing the types of  pain medicines that you have been using to treat your headaches and how often you take them. How is this treated? This condition may be treated or managed by:  Discontinuing frequent use of the analgesic medicine. Doing this may worsen your headaches at first, but the pain should eventually become more manageable, less frequent, and less severe.  Seeing a headache specialist. He or she may be able to help you manage your headaches and help make sure there is not another cause of the headaches.  Using methods of stress relief, such as acupuncture, counseling, biofeedback, and massage. Talk with your health care provider about which methods might be good for you. Follow these instructions at home:  Take over-the-counter and prescription medicines only as told by your health care provider.  Stop the repeated use of pain medicine as told by your health care provider. Stopping can be difficult. Carefully follow instructions from your health care provider.  Avoid triggers that are known to cause your primary headaches.  Keep all follow-up visits as told by your health care provider. This is important. Contact a health care provider if:  You continue to experience headaches after following treatments that your health care provider recommended. Get help right away if:  You develop new headache pain.  You develop headache pain that is different than what you have experienced in the past.  You develop numbness or tingling in your arms or legs.  You develop changes in your speech or vision. This information is not intended to replace advice given to you by your health care provider. Make sure you discuss any questions you have with your health care provider. Document Released: 11/09/2003 Document Revised: 08/01/2017 Document Reviewed: 01/22/2016 Elsevier Patient Education  [place].   Sleep Apnea Sleep apnea affects breathing during sleep. It causes breathing to stop for a short  time or to become shallow. It can also increase the risk of:  Heart attack.  Stroke.  Being very overweight (obese).  Diabetes.  Heart failure.  Irregular heartbeat. The goal of treatment is to help you breathe normally again. What are the causes? There are three kinds of sleep apnea:  Obstructive sleep apnea. This is caused by a blocked or collapsed airway.  Central sleep apnea. This happens when the brain does not send the right signals to the muscles that control breathing.  Mixed sleep apnea. This is a combination of obstructive and central sleep apnea. The most common cause of this condition is a collapsed or blocked airway. This can happen if:  Your throat muscles are too relaxed.  Your tongue and tonsils are too large.  You are overweight.  Your airway is too  small. What increases the risk?  Being overweight.  Smoking.  Having a small airway.  Being older.  Being female.  Drinking alcohol.  Taking medicines to calm yourself (sedatives or tranquilizers).  Having family members with the condition. What are the signs or symptoms?  Trouble staying asleep.  Being sleepy or tired during the day.  Getting angry a lot.  Loud snoring.  Headaches in the morning.  Not being able to focus your mind (concentrate).  Forgetting things.  Less interest in sex.  Mood swings.  Personality changes.  Feelings of sadness (depression).  Waking up a lot during the night to pee (urinate).  Dry mouth.  Sore throat. How is this diagnosed?  Your medical history.  A physical exam.  A test that is done when you are sleeping (sleep study). The test is most often done in a sleep lab but may also be done at home. How is this treated?   Sleeping on your side.  Using a medicine to get rid of mucus in your nose (decongestant).  Avoiding the use of alcohol, medicines to help you relax, or certain pain medicines (narcotics).  Losing weight, if needed.   Changing your diet.  Not smoking.  Using a machine to open your airway while you sleep, such as: ? An oral appliance. This is a mouthpiece that shifts your lower jaw forward. ? A CPAP device. This device blows air through a mask when you breathe out (exhale). ? An EPAP device. This has valves that you put in each nostril. ? A BPAP device. This device blows air through a mask when you breathe in (inhale) and breathe out.  Having surgery if other treatments do not work. It is important to get treatment for sleep apnea. Without treatment, it can lead to:  High blood pressure.  Coronary artery disease.  In men, not being able to have an erection (impotence).  Reduced thinking ability. Follow these instructions at home: Lifestyle  Make changes that your doctor recommends.  Eat a healthy diet.  Lose weight if needed.  Avoid alcohol, medicines to help you relax, and some pain medicines.  Do not use any products that contain nicotine or tobacco, such as cigarettes, e-cigarettes, and chewing tobacco. If you need help quitting, ask your doctor. General instructions  Take over-the-counter and prescription medicines only as told by your doctor.  If you were given a machine to use while you sleep, use it only as told by your doctor.  If you are having surgery, make sure to tell your doctor you have sleep apnea. You may need to bring your device with you.  Keep all follow-up visits as told by your doctor. This is important. Contact a doctor if:  The machine that you were given to use during sleep bothers you or does not seem to be working.  You do not get better.  You get worse. Get help right away if:  Your chest hurts.  You have trouble breathing in enough air.  You have an uncomfortable feeling in your back, arms, or stomach.  You have trouble talking.  One side of your body feels weak.  A part of your face is hanging down. These symptoms may be an emergency. Do not  wait to see if the symptoms will go away. Get medical help right away. Call your local emergency services (911 in the [place] Do not drive yourself to the hospital. Summary  This condition affects breathing during sleep.  The most common  cause is a collapsed or blocked airway.  The goal of treatment is to help you breathe normally while you sleep. This information is not intended to replace advice given to you by your health care provider. Make sure you discuss any questions you have with your health care provider. Document Released: 05/28/2008 Document Revised: 06/05/2018 Document Reviewed: 04/14/2018 Elsevier Patient Education  2020 [employer].

## 2019-05-14 NOTE — Progress Notes (Signed)
I reviewed note and agree with plan.   Penni Bombard, MD [id number], [id number] AM Certified in Neurology, Neurophysiology and Neuroimaging  [facility] [address] [place], [place] [address] [phone]

## 2019-05-17 DIAGNOSIS — G4733 Obstructive sleep apnea (adult) (pediatric): Secondary | ICD-10-CM

## 2019-05-17 NOTE — Telephone Encounter (Signed)
email sent today stating  "Morning, I was seen at the Neurologist and was told to reach out to you. I was told to talk about my sleep apnea and my Cpap machine. I did explain to her , I haven't been using it because . I need all ne supplies. I have no filter , my hoses are old and it just needed to be cleaned real good before I'm able to use to again. So she told me to reach out to my Dr to see what I can do . Because I definitely needed to be back on it ASAP!"  Patient last seen in office 12/09/18 and cancelled follow up in May with AO.  Dr. Ander Slade please advise if we need to just send order for supplies or have an appointment with patient.

## 2019-05-17 NOTE — Telephone Encounter (Signed)
Referral to DME  Order CPAP supplies

## 2019-05-19 ENCOUNTER — Ambulatory Visit (INDEPENDENT_AMBULATORY_CARE_PROVIDER_SITE_OTHER): Payer: 59 | Admitting: Allergy

## 2019-05-19 ENCOUNTER — Encounter: Payer: Self-pay | Admitting: Allergy

## 2019-05-19 ENCOUNTER — Encounter: Payer: Self-pay | Admitting: Family Medicine

## 2019-05-19 ENCOUNTER — Other Ambulatory Visit: Payer: Self-pay

## 2019-05-19 VITALS — BP 116/62 | HR 60 | Temp 97.8°F | Resp 17 | Ht 69.0 in | Wt 238.2 lb

## 2019-05-19 DIAGNOSIS — J310 Chronic rhinitis: Secondary | ICD-10-CM

## 2019-05-19 DIAGNOSIS — T7800XA Anaphylactic reaction due to unspecified food, initial encounter: Secondary | ICD-10-CM | POA: Insufficient documentation

## 2019-05-19 DIAGNOSIS — J4540 Moderate persistent asthma, uncomplicated: Secondary | ICD-10-CM | POA: Diagnosis not present

## 2019-05-19 DIAGNOSIS — T7800XD Anaphylactic reaction due to unspecified food, subsequent encounter: Secondary | ICD-10-CM

## 2019-05-19 MED ORDER — MONTELUKAST SODIUM 10 MG PO TABS: 10.0000 mg | ORAL_TABLET | Freq: Every day | ORAL | 5 refills | Status: DC

## 2019-05-19 MED ORDER — DULERA 200-5 MCG/ACT IN AERO: 2.0000 | INHALATION_SPRAY | Freq: Two times a day (BID) | RESPIRATORY_TRACT | 5 refills | Status: DC

## 2019-05-19 MED ORDER — FLUTICASONE PROPIONATE 50 MCG/ACT NA SUSP: 1.0000 | Freq: Two times a day (BID) | NASAL | 5 refills | Status: DC | PRN

## 2019-05-19 NOTE — Assessment & Plan Note (Signed)
Diagnosed with asthma as a child.  Currently on Flovent unknown dose 2 puffs twice a day for the last few months and Singulair daily.  Has been using rescue inhaler throughout the day for the last few months with some benefit.  No specific triggers identified but concerned about environmental allergies. Ex-smoker.  Today's spirometry was normal however it did show 18% improvement in FEV1 post bronchodilator treatment.  Today's skin testing was negative to environmental allergy panel. Declines intradermal testing today and will get bloodwork instead.  . Daily controller medication(s): start Dulera 200 2 puffs twice a day with spacer and rinse mouth afterwards. This replaces Flovent for now.  . Continue Singulair 10mg  daily. o Cautioned that in some children/adults can experience behavioral changes including hyperactivity, agitation, depression, sleep disturbances and suicidal ideations. These side effects are rare, but if you notice them you should notify me and discontinue Singulair (montelukast). . Prior to physical activity: May use albuterol rescue inhaler 2 puffs 5 to 15 minutes prior to strenuous physical activities. Marland Kitchen Rescue medications: May use albuterol rescue inhaler 2 puffs or nebulizer every 4 to 6 hours as needed for shortness of breath, chest tightness, coughing, and wheezing. Monitor frequency of use.

## 2019-05-19 NOTE — Assessment & Plan Note (Addendum)
Intermittent rhinitis symptoms for the past 20+ years mainly during seasonal changes.  Takes over-the-counter antihistamines and Flonase as needed with good benefit.  No previous allergy testing.  Today's skin testing was negative to environmental allergy panel. Declines intradermal testing today and will get bloodwork instead.  Will make additional recommendations based on results.  May use over the counter antihistamines such as Zyrtec (cetirizine), Claritin (loratadine), Allegra (fexofenadine), or Xyzal (levocetirizine) daily as needed.  May use Flonase 1 spray per nostril 1-2 times a day as needed for nasal congestion.

## 2019-05-19 NOTE — Assessment & Plan Note (Signed)
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  No previous evaluation.  Patient consumes limited sesame and finned fish.  Currently avoiding all peanuts and tree nuts.  Today skin testing was positive to tree nuts, fish mix and sesame.  Negative to peanuts.  Continue strict avoidance of peanuts and tree nuts.  Start to avoid sesame and finned fish as well.   Get bloodwork and will make additional recommendations based on results.   For mild symptoms you can take over the counter antihistamines such as Benadryl and monitor symptoms closely. If symptoms worsen or if you have severe symptoms including breathing issues, throat closure, significant swelling, whole body hives, severe diarrhea and vomiting, lightheadedness then inject epinephrine and seek immediate medical care afterwards.  Food action plan given.

## 2019-05-19 NOTE — Telephone Encounter (Signed)
Order placed for CPAP supplies

## 2019-05-19 NOTE — Patient Instructions (Addendum)
Today's skin testing showed: Negative to environmental allergen panel - will get bloodwork. Positive to sesame, fish mix, tree nuts. Negative to peanuts - will get bloodwork.   Asthma: . Daily controller medication(s): start Dulera 200 2 puffs twice a day with spacer and rinse mouth afterwards. This replaces Flovent for now.  . Continue Singulair 10mg  daily. o Cautioned that in some children/adults can experience behavioral changes including hyperactivity, agitation, depression, sleep disturbances and suicidal ideations. These side effects are rare, but if you notice them you should notify me and discontinue Singulair (montelukast). . Prior to physical activity: May use albuterol rescue inhaler 2 puffs 5 to 15 minutes prior to strenuous physical activities. Marland Kitchen Rescue medications: May use albuterol rescue inhaler 2 puffs or nebulizer every 4 to 6 hours as needed for shortness of breath, chest tightness, coughing, and wheezing. Monitor frequency of use.  . Asthma control goals:  o Full participation in all desired activities (may need albuterol before activity) o Albuterol use two times or less a week on average (not counting use with activity) o Cough interfering with sleep two times or less a month o Oral steroids no more than once a year o No hospitalizations  Food allergy:  Continue strict avoidance of peanuts and tree nuts.  Start to avoid sesame and finned fish.   For mild symptoms you can take over the counter antihistamines such as Benadryl and monitor symptoms closely. If symptoms worsen or if you have severe symptoms including breathing issues, throat closure, significant swelling, whole body hives, severe diarrhea and vomiting, lightheadedness then inject epinephrine and seek immediate medical care afterwards.  Food action plan given.  Rhinitis:   May use over the counter antihistamines such as Zyrtec (cetirizine), Claritin (loratadine), Allegra (fexofenadine), or Xyzal  (levocetirizine) daily as needed.  May use Flonase 1 spray per nostril 1-2 times a day as needed for nasal congestion.  Follow up in 1 month for asthma check.
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
## 2019-05-20 ENCOUNTER — Other Ambulatory Visit: Payer: Self-pay | Admitting: Internal Medicine

## 2019-05-20 MED ORDER — TIZANIDINE HCL 4 MG PO TABS: 4.0000 mg | ORAL_TABLET | Freq: Four times a day (QID) | ORAL | 1 refills | Status: DC | PRN

## 2019-05-21 ENCOUNTER — Encounter: Payer: Self-pay | Admitting: Pulmonary Disease

## 2019-05-21 ENCOUNTER — Ambulatory Visit (INDEPENDENT_AMBULATORY_CARE_PROVIDER_SITE_OTHER): Payer: 59 | Admitting: Pulmonary Disease

## 2019-05-21 ENCOUNTER — Telehealth: Payer: Self-pay | Admitting: Pulmonary Disease

## 2019-05-21 ENCOUNTER — Other Ambulatory Visit: Payer: Self-pay

## 2019-05-21 VITALS — BP 120/78 | HR 62 | Temp 97.5°F | Ht 69.0 in | Wt 241.0 lb

## 2019-05-21 DIAGNOSIS — G4733 Obstructive sleep apnea (adult) (pediatric): Secondary | ICD-10-CM

## 2019-05-21 DIAGNOSIS — Z9989 Dependence on other enabling machines and devices: Secondary | ICD-10-CM

## 2019-05-21 DIAGNOSIS — G4700 Insomnia, unspecified: Secondary | ICD-10-CM

## 2019-05-21 LAB — ALLERGENS(7)
Brazil Nut IgE: 0.73 kU/L — AB
F020-IgE Almond: 0.42 kU/L — AB
F202-IgE Cashew Nut: 0.66 kU/L — AB
Hazelnut (Filbert) IgE: 1.77 kU/L — AB
Peanut IgE: 0.19 kU/L — AB
Pecan Nut IgE: 3.13 kU/L — AB
Walnut IgE: 5.95 kU/L — AB

## 2019-05-21 LAB — ALLERGENS W/TOTAL IGE AREA 2
Alternaria Alternata IgE: <0.1 kU/L
Aspergillus Fumigatus IgE: <0.1 kU/L
Bermuda Grass IgE: <0.1 kU/L
Cat Dander IgE: <0.1 kU/L
Cedar, Mountain IgE: <0.1 kU/L
Cladosporium Herbarum IgE: <0.1 kU/L
Cockroach, German IgE: <0.1 kU/L
Common Silver Birch IgE: <0.1 kU/L
Cottonwood IgE: <0.1 kU/L
D Farinae IgE: <0.1 kU/L
D Pteronyssinus IgE: <0.1 kU/L
Dog Dander IgE: 0.26 kU/L — AB
Elm, American IgE: <0.1 kU/L
IgE (Immunoglobulin E), Serum: 63 IU/mL (ref 6–495)
Johnson Grass IgE: <0.1 kU/L
Maple/Box Elder IgE: <0.1 kU/L
Mouse Urine IgE: <0.1 kU/L
Oak, White IgE: <0.1 kU/L
Pecan, Hickory IgE: 0.16 kU/L — AB
Penicillium Chrysogen IgE: <0.1 kU/L
Pigweed, Rough IgE: <0.1 kU/L
Ragweed, Short IgE: <0.1 kU/L
Sheep Sorrel IgE Qn: <0.1 kU/L
Timothy Grass IgE: <0.1 kU/L
White Mulberry IgE: <0.1 kU/L

## 2019-05-21 LAB — ALLERGEN PROFILE, FOOD-FISH
Allergen Mackerel IgE: <0.1 kU/L
Allergen Salmon IgE: <0.1 kU/L
Allergen Trout IgE: <0.1 kU/L
Allergen Walley Pike IgE: <0.1 kU/L
Codfish IgE: <0.1 kU/L
Halibut IgE: <0.1 kU/L
Tuna: <0.1 kU/L

## 2019-05-21 LAB — ALLERGEN SESAME F10: Sesame Seed IgE: 0.75 kU/L — AB

## 2019-05-21 MED ORDER — BELSOMRA 10 MG PO TABS: 10.0000 mg | ORAL_TABLET | Freq: Every day | ORAL | 1 refills | Status: DC

## 2019-05-21 NOTE — Patient Instructions (Signed)
Insomnia Obstructive sleep apnea  We will get information from your previous medical supply company Contact the DME company to try to get new supplies  You may need a home sleep study-if unable to get your supplies based on your previous study  Switched you over to [place] from [facility]  Call with significant concerns  We will see you back in the office in 3 months
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
## 2019-05-21 NOTE — Telephone Encounter (Signed)
DME referral  CPAP supplies Sleep study results on record shows she should be on CPAP of 6  Obtain compliance data in 4 to 6 weeks

## 2019-05-24 ENCOUNTER — Encounter: Payer: Self-pay | Admitting: Allergy

## 2019-05-24 NOTE — Telephone Encounter (Signed)
Order for cpap supplies was placed 09/17. Will place order for compliance download in 4-6 weeks as well as to ensure cpap pressure is 6. Order placed. Nothing further needed at this time.

## 2019-05-26 ENCOUNTER — Telehealth: Payer: Self-pay | Admitting: Pulmonary Disease

## 2019-05-26 DIAGNOSIS — G4733 Obstructive sleep apnea (adult) (pediatric): Secondary | ICD-10-CM

## 2019-05-26 NOTE — Telephone Encounter (Signed)
Spoke with pt, she states [facility] called her and she is ok with doing another sleep study. AO can we order sleep study? Please advise.

## 2019-05-27 NOTE — Telephone Encounter (Signed)
Okay to order a sleep study

## 2019-05-27 NOTE — Telephone Encounter (Signed)
Called pt and asked if she wanted new study or titration. Pt prefers to have titration study  Orders placed  Nothing further needed.

## 2019-05-30 ENCOUNTER — Other Ambulatory Visit: Payer: Self-pay | Admitting: Internal Medicine

## 2019-05-31 ENCOUNTER — Other Ambulatory Visit: Payer: Self-pay | Admitting: Neurology

## 2019-06-01 ENCOUNTER — Other Ambulatory Visit: Payer: Self-pay

## 2019-06-01 ENCOUNTER — Ambulatory Visit (INDEPENDENT_AMBULATORY_CARE_PROVIDER_SITE_OTHER): Payer: 59 | Admitting: Internal Medicine

## 2019-06-01 ENCOUNTER — Encounter: Payer: Self-pay | Admitting: Internal Medicine

## 2019-06-01 VITALS — BP 128/82 | HR 57 | Temp 98.0°F | Ht 69.0 in | Wt 236.1 lb

## 2019-06-01 DIAGNOSIS — Z23 Encounter for immunization: Secondary | ICD-10-CM | POA: Diagnosis not present

## 2019-06-01 DIAGNOSIS — I10 Essential (primary) hypertension: Secondary | ICD-10-CM | POA: Diagnosis not present

## 2019-06-01 DIAGNOSIS — Z1239 Encounter for other screening for malignant neoplasm of breast: Secondary | ICD-10-CM

## 2019-06-01 DIAGNOSIS — J4540 Moderate persistent asthma, uncomplicated: Secondary | ICD-10-CM

## 2019-06-01 DIAGNOSIS — R51 Headache: Secondary | ICD-10-CM

## 2019-06-01 DIAGNOSIS — J029 Acute pharyngitis, unspecified: Secondary | ICD-10-CM | POA: Diagnosis not present

## 2019-06-01 DIAGNOSIS — Z79899 Other long term (current) drug therapy: Secondary | ICD-10-CM

## 2019-06-01 DIAGNOSIS — Z7951 Long term (current) use of inhaled steroids: Secondary | ICD-10-CM

## 2019-06-01 MED ORDER — OMEPRAZOLE 40 MG PO CPDR: 40.0000 mg | DELAYED_RELEASE_CAPSULE | Freq: Every day | ORAL | 2 refills | Status: DC

## 2019-06-01 MED ORDER — NYSTATIN 100000 UNIT/ML MT SUSP: 5.0000 mL | Freq: Four times a day (QID) | OROMUCOSAL | 0 refills | Status: DC

## 2019-06-01 NOTE — Patient Instructions (Signed)
You were seen for a sore throat.  This is most likely due to increased acid production in your stomach.  We have sent you a prescription for omeprazole which we want you to take daily.  It can take a couple of weeks before this medication takes effect.  In case your sore throat is due to a fungal infection we have prescribed you a nystatin mouth rinse which you can take by swishing in your mouth, gargling, and then spitting out.  Thank you for allowing [place] to be part of your medical care.

## 2019-06-01 NOTE — Assessment & Plan Note (Signed)
Patient reports a 24-month history of sore throat. The pain has not worsened or improved. She occasionally has pain with swallowing.  Denies cough, congestion, postnasal drip. Patient does report chronic history of acid reflux symptoms. Symptoms may be secondary to acid reflux versus candidal infection. Physical exam reveals erythematous mucosa but no exudates or plaques observed. *We will prescribe omeprazole 40 mg daily for acid reflux as well as nystatin suspension mouthwash given sore throat in the context of ICS therapy *Proper technique on use of Dulera was reviewed to minimize side effects.

## 2019-06-01 NOTE — Telephone Encounter (Signed)
Got a request for refill on Effexor, this is not listed on her medications, no recent indication that we prescribe this medication.
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
## 2019-06-01 NOTE — Assessment & Plan Note (Signed)
Patient reports that she was temporarily on blood pressure medication in the past but has not required this medication.  Blood pressure on presentation was 138/92 but was rechecked manually and was 128/82.  Patient states her blood pressure is often elevated on automated readings.  We will continue to monitor at follow-up visit.

## 2019-06-01 NOTE — Assessment & Plan Note (Signed)
Patient reports that she sees an allergist who prescribes her asthma medications.  Patient reports that about 1 to 2 weeks ago she was started on Dulera.  Prior to starting [facility], she was using her rescue inhaler 3-4 times per day.  She is currently using her rescue inhaler 3-4 times per week and feels like her symptoms are well controlled.

## 2019-06-02 ENCOUNTER — Ambulatory Visit: Admit: 2019-06-02 | Payer: 59 | Admitting: Urology

## 2019-06-02 SURGERY — CYSTOSCOPY, WITH BLADDER HYDRODISTENSION
Anesthesia: General

## 2019-06-02 NOTE — Addendum Note (Signed)
Addended by: Lalla Brothers T on: 06/02/2019 03:00 PM   Modules accepted: Level of Service

## 2019-06-02 NOTE — Progress Notes (Signed)
Internal Medicine Clinic Attending  I saw and evaluated the patient.  I personally confirmed the key portions of the history and exam documented by Dr. MacLean and I reviewed pertinent patient test results.  The assessment, diagnosis, and plan were formulated together and I agree with the documentation in the resident's note.  

## 2019-06-05 ENCOUNTER — Other Ambulatory Visit (HOSPITAL_COMMUNITY): Admission: RE | Admit: 2019-06-05 | Payer: 59 | Source: Ambulatory Visit

## 2019-06-07 ENCOUNTER — Other Ambulatory Visit (HOSPITAL_COMMUNITY)
Admission: RE | Admit: 2019-06-07 | Discharge: 2019-06-07 | Disposition: A | Discharge status: Home / Self Care | Payer: 59 | Source: Ambulatory Visit | Attending: Pulmonary Disease | Admitting: Pulmonary Disease

## 2019-06-07 DIAGNOSIS — Z01812 Encounter for preprocedural laboratory examination: Secondary | ICD-10-CM | POA: Diagnosis present

## 2019-06-07 DIAGNOSIS — Z20828 Contact with and (suspected) exposure to other viral communicable diseases: Secondary | ICD-10-CM | POA: Diagnosis not present

## 2019-06-07 LAB — SARS CORONAVIRUS 2 (TAT 6-24 HRS): SARS Coronavirus 2: NEGATIVE

## 2019-06-08 ENCOUNTER — Encounter: Payer: Self-pay | Admitting: Internal Medicine

## 2019-06-09 ENCOUNTER — Ambulatory Visit (HOSPITAL_BASED_OUTPATIENT_CLINIC_OR_DEPARTMENT_OTHER): Payer: 59 | Attending: Pulmonary Disease | Admitting: Pulmonary Disease

## 2019-06-09 ENCOUNTER — Other Ambulatory Visit: Payer: Self-pay | Admitting: Internal Medicine

## 2019-06-09 ENCOUNTER — Other Ambulatory Visit: Payer: Self-pay

## 2019-06-09 DIAGNOSIS — Z7951 Long term (current) use of inhaled steroids: Secondary | ICD-10-CM | POA: Insufficient documentation

## 2019-06-09 DIAGNOSIS — Z79899 Other long term (current) drug therapy: Secondary | ICD-10-CM | POA: Insufficient documentation

## 2019-06-09 DIAGNOSIS — G4733 Obstructive sleep apnea (adult) (pediatric): Secondary | ICD-10-CM | POA: Insufficient documentation

## 2019-06-13 ENCOUNTER — Telehealth: Payer: Self-pay | Admitting: Pulmonary Disease

## 2019-06-13 DIAGNOSIS — G4733 Obstructive sleep apnea (adult) (pediatric): Secondary | ICD-10-CM

## 2019-06-13 NOTE — Procedures (Signed)
POLYSOMNOGRAPHY  Last, First: Malyka, Winnie MRN: [id number] Gender: Female Age (years): 48 Weight (lbs): 238 DOB: [birth year] BMI: 35 Primary Care: No PCP Epworth Score: 2 Referring: Laurin Coder MD Technician: Zadie Rhine Interpreting: Laurin Coder MD Study Type: CPAP Ordered Study Type: CPAP Study date: 06/09/2019 Location: [place] CLINICAL INFORMATION Janneth Overstreet is a 48 year old Female and was referred to the sleep center for evaluation of G47.30 Sleep Apnea, Unspecified (780.57). Indications include OSA, Snoring.  MEDICATIONS Patient self administered medications include: dulera, PRILOSEC, SINGULAIR, TOPAMAX, zolpidem. Medications administered during study include No sleep medicine administered.  SLEEP STUDY TECHNIQUE The patient underwent an attended overnight polysomnography titration to assess the effects of CPAP therapy. The following variables were monitored: EEG(C4-A1, C3-A2, O1-A2, O2-A1), EOG, submental and leg EMG, ECG, oxyhemoglobin saturation by pulse oximetry, thoracic and abdominal respiratory effort belts, nasal/oral airflow by pressure sensor, body position sensor and snoring sensor. CPAP pressure was titrated to eliminate apneas, hypopneas and oxygen desaturation. Hypopneas were scored per AASM definition IB (4% desaturation)  TECHNICIAN COMMENTS Comments added by Technician: ONE RESTROOM VISTED Comments added by Scorer: N/A SLEEP ARCHITECTURE The study was initiated at 10:57:39 PM and terminated at 4:57:42 AM. Total recorded time was 360 minutes. EEG confirmed total sleep time was 317 minutes yielding a sleep efficiency of 88.0%%. Sleep onset after lights out was 10.7 minutes with a REM latency of 91.0 minutes. The patient spent 1.6%% of the night in stage N1 sleep, 47.2%% in stage N2 sleep, 22.4%% in stage N3 and 28.9% in REM. The Arousal Index was 4.4/hour. RESPIRATORY PARAMETERS The overall AHI was 0.2 per hour, and the RDI was 0.2 events/hour  with a central apnea index of 0.2per hour. The most appropriate setting of CPAP was 6 cm H2O. At this setting, the sleep efficiency was 79% and the patient was supine for 100%. The AHI was 0.0 events per hour, and the RDI was 0.0 events/hour (with 0.2 central events) and the arousal index was 3.4 per hour.The oxygen nadir was 96.0% during sleep.    The cumulative time under 88% oxygen saturation was 5.5 minutes  LEG MOVEMENT DATA The total leg movements were 0 with a resulting leg movement index of 0.0/hr. Associated arousal with leg movement index was 0.0/hr. CARDIAC DATA The underlying cardiac rhythm was most consistent with sinus rhythm. Mean heart rate during sleep was 54.2 bpm. Additional rhythm abnormalities include None.   IMPRESSIONS - EKG showed no cardiac abnormalities. - No snoring was audible during this study. - No significant Oxygen Desaturation - Optimal pressure attained. - No Significant Central Sleep Apnea (CSA) - No Significant Upper Airway Resistance Syndrome(UARS). - No significant periodic leg movements(PLMs) during sleep, no significant associated arousals. - Normal sleep efficiency, normal primary sleep latency, normal REM sleep latency and short slow wave latency.   DIAGNOSIS - Obstructive Sleep Apnea (327.23 [G47.33 ICD-10])   RECOMMENDATIONS - Trial of CPAP therapy on 6 cm H2O with a Medium size Resmed Full Face Mask AirFit F20 mask and heated humidification. - Avoid alcohol, sedatives and other CNS depressants that may worsen sleep apnea and disrupt normal sleep architecture. - Sleep hygiene should be reviewed to assess factors that may improve sleep quality. - Weight management and regular exercise should be initiated or continued. - Return to Sleep Center for re-evaluation after 4 weeks of therapy  [Electronically signed] 06/13/2019 09:54 AM  Sherrilyn Rist MD NPI: [id number]

## 2019-06-13 NOTE — Telephone Encounter (Signed)
Sleep study result  Date of study 06/09/2019  Impression: Obstructive sleep apnea Optimal pressure titration of 6 cm of water  Recommendation:  Trial of CPAP therapy on 6 cm H2O with a Medium size Resmed Full Face Mask AirFit F20 mask and heated humidification.  Follow-up within 4 to 6 weeks of initiation of CPAP therapy

## 2019-06-14 NOTE — Telephone Encounter (Signed)
Spoke with pt and notified of results per Dr. Ander Slade. Pt verbalized understanding and denied any questions. Order placed for CPAP and appt scheduled

## 2019-06-16 ENCOUNTER — Other Ambulatory Visit: Payer: Self-pay

## 2019-06-16 ENCOUNTER — Encounter: Payer: Self-pay | Admitting: Allergy

## 2019-06-16 ENCOUNTER — Ambulatory Visit (INDEPENDENT_AMBULATORY_CARE_PROVIDER_SITE_OTHER): Payer: 59 | Admitting: Allergy

## 2019-06-16 VITALS — BP 142/80 | HR 54 | Temp 98.0°F | Resp 16 | Ht 69.49 in | Wt 236.0 lb

## 2019-06-16 DIAGNOSIS — T7800XD Anaphylactic reaction due to unspecified food, subsequent encounter: Secondary | ICD-10-CM

## 2019-06-16 DIAGNOSIS — J3089 Other allergic rhinitis: Secondary | ICD-10-CM | POA: Diagnosis not present

## 2019-06-16 DIAGNOSIS — J4540 Moderate persistent asthma, uncomplicated: Secondary | ICD-10-CM | POA: Diagnosis not present

## 2019-06-16 DIAGNOSIS — R12 Heartburn: Secondary | ICD-10-CM | POA: Insufficient documentation

## 2019-06-16 DIAGNOSIS — J302 Other seasonal allergic rhinitis: Secondary | ICD-10-CM | POA: Insufficient documentation

## 2019-06-16 MED ORDER — EPINEPHRINE 0.3 MG/0.3ML IJ SOAJ: 0.3000 mg | Freq: Once | INTRAMUSCULAR | 1 refills | Status: AC

## 2019-06-16 NOTE — Patient Instructions (Addendum)
Asthma:   Daily controller medication(s):Dulera 200 2 puffs twice a day with spacer and rinse mouth afterwards.   Continue Singulair 10mg  daily. ? Cautioned that in some children/adults can experience behavioral changes including hyperactivity, agitation, depression, sleep disturbances and suicidal ideations. These side effects are rare, but if you notice them you should notify me and discontinue Singulair (montelukast).  Prior to physical activity:May use albuterol rescue inhaler 2 puffs 5 to 15 minutes prior to strenuous physical activities.  Rescue medications:May use albuterol rescue inhaler 2 puffs or nebulizer every 4 to 6 hours as needed for shortness of breath, chest tightness, coughing, and wheezing. Monitor frequency of use.  Asthma control goals:  Full participation in all desired activities (may need albuterol before activity) Albuterol use two times or less a week on average (not counting use with activity) Cough interfering with sleep two times or less a month Oral steroids no more than once a year No hospitalizations  Anaphylactic shock due to adverse food reaction  Past skin testing was positive to tree nuts, fish mix and sesame.  Negative to peanuts.  Continue strict avoidance of tree nuts, sesame.  For mild symptoms you can take over the counter antihistamines such as Benadryl and monitor symptoms closely. If symptoms worsen or if you have severe symptoms including breathing issues, throat closure, significant swelling, whole body hives, severe diarrhea and vomiting, lightheadedness then inject epinephrine and seek immediate medical care afterwards.  Food action plan in place.   Allergic rhinitis  Past allergy testing positive to dog and borderline to tree pollen.  Continue environmental control measures.   You may need to take the below allergy medications on a daily basis during the spring.  May use over the counter antihistamines such as Zyrtec  (cetirizine), Claritin (loratadine), Allegra (fexofenadine), or Xyzal (levocetirizine) daily as needed.  May use Flonase 1 spray per nostril 1-2 times a day as needed for nasal congestion.  Reflux  Take omeprazole 40mg  in the morning. Nothing to eat or drink for 30 minutes.  Try to drink some water before you drink coffee in the morning.  Follow up in 3 months or sooner if needed.  Pet Allergen Avoidance: . Contrary to popular opinion, there are no "hypoallergenic" breeds of dogs or cats. That is because people are not allergic to an animal's hair, but to an allergen found in the animal's saliva, dander (dead skin flakes) or urine. Pet allergy symptoms typically occur within minutes. For some people, symptoms can build up and become most severe 8 to 12 hours after contact with the animal. People with severe allergies can experience reactions in public places if dander has been transported on the pet owners' clothing. Marland Kitchen Keeping an animal outdoors is only a partial solution, since homes with pets in the yard still have higher concentrations of animal allergens. . Before getting a pet, ask your allergist to determine if you are allergic to animals. If your pet is already considered part of your family, try to minimize contact and keep the pet out of the bedroom and other rooms where you spend a great deal of time. . As with dust mites, vacuum carpets often or replace carpet with a hardwood floor, tile or linoleum. . High-efficiency particulate air (HEPA) cleaners can reduce allergen levels over time. . While dander and saliva are the source of cat and dog allergens, urine is the source of allergens from rabbits, hamsters, mice and [place] pigs; so ask a non-allergic family member to clean the  animal's cage. . If you have a pet allergy, talk to your allergist about the potential for allergy immunotherapy (allergy shots). This strategy can often provide long-term relief. Reducing Pollen  Exposure . Pollen seasons: trees (spring), grass (summer) and ragweed/weeds (fall). Marland Kitchen Keep windows closed in your home and car to lower pollen exposure.  Susa Simmonds air conditioning in the bedroom and throughout the house if possible.  . Avoid going out in dry windy days - especially early morning. . Pollen counts are highest between 5 - 10 AM and on dry, hot and windy days.  . Save outside activities for late afternoon or after a heavy rain, when pollen levels are lower.  . Avoid mowing of grass if you have grass pollen allergy. Marland Kitchen Be aware that pollen can also be transported indoors on people and pets.  . Dry your clothes in an automatic dryer rather than hanging them outside where they might collect pollen.  . Rinse hair and eyes before bedtime.   Heartburn Heartburn is a type of pain or discomfort that can happen in the throat or chest. It is often described as a burning pain. It may also cause a bad, acid-like taste in the mouth. Heartburn may feel worse when you lie down or bend over. It may be worse at night. It may be caused by stomach contents that move back up (reflux) into the tube that connects the mouth with the stomach (esophagus). Follow these instructions at home: Eating and drinking   Avoid certain foods and drinks as told by your doctor. This may include: ? Coffee and tea (with or without caffeine). ? Drinks that have alcohol. ? Energy drinks and sports drinks. ? Carbonated drinks or sodas. ? Chocolate and cocoa. ? Peppermint and mint flavorings. ? Garlic and onions. ? Horseradish. ? Spicy and acidic foods, such as:  Peppers.  Chili powder and curry powder.  Vinegar.  Hot sauces and BBQ sauce. ? Citrus fruit juices and citrus fruits, such as:  Oranges.  Lemons.  Limes. ? Tomato-based foods, such as:  Red sauce and pizza with red sauce.  Chili.  Salsa. ? Fried and fatty foods, such as:  Donuts.  [place] fries and potato chips.  High-fat  dressings. ? High-fat meats, such as:  Hot dogs and sausage.  Rib eye steak.  Ham and bacon. ? High-fat dairy items, such as:  Whole milk.  Butter.  Cream cheese.  Eat small meals often. Avoid eating large meals.  Avoid drinking large amounts of liquid with your meals.  Avoid eating meals during the 2-3 hours before bedtime.  Avoid lying down right after you eat.  Do not exercise right after you eat. Lifestyle      If you are overweight, lose an amount of weight that is healthy for you. Ask your doctor about a safe weight loss goal.  Do not use any products that contain nicotine or tobacco, including cigarettes, e-cigarettes, and chewing tobacco. These can make your symptoms worse. If you need help quitting, ask your doctor.  Wear loose clothes. Do not wear anything tight around your waist.  Raise (elevate) the head of your bed about 6 inches (15 cm) when you sleep.  Try to lower your stress. If you need help doing this, ask your doctor. General instructions  Pay attention to any changes in your symptoms.  Take over-the-counter and prescription medicines only as told by your doctor. ? Do not take aspirin, ibuprofen, or other NSAIDs unless your doctor says  it is okay. ? Stop medicines only as told by your doctor.  Keep all follow-up visits as told by your doctor. This is important. Contact a doctor if:  You have new symptoms.  You lose weight and you do not know why it is happening.  You have trouble swallowing, or it hurts to swallow.  You have wheezing or a cough that keeps happening.  Your symptoms do not get better with treatment.  You have heartburn often for more than 2 weeks. Get help right away if:  You have pain in your arms, neck, jaw, teeth, or back.  You feel sweaty, dizzy, or light-headed.  You have chest pain or shortness of breath.  You throw up (vomit) and your throw up looks like blood or coffee grounds.  Your poop (stool) is  bloody or black. These symptoms may represent a serious problem that is an emergency. Do not wait to see if the symptoms will go away. Get medical help right away. Call your local emergency services (911 in the [place] Do not drive yourself to the hospital. Summary  Heartburn is a type of pain that can happen in the throat or chest. It can feel like a burning pain. It may also cause a bad, acid-like taste in the mouth.  You may need to avoid certain foods and drinks to help your symptoms. Ask your doctor what foods and drinks you should avoid.  Take over-the-counter and prescription medicines only as told by your doctor. Do not take aspirin, ibuprofen, or other NSAIDs unless your doctor told you to do so.  Contact your doctor if your symptoms do not get better or they get worse. This information is not intended to replace advice given to you by your health care provider. Make sure you discuss any questions you have with your health care provider. Document Released: 05/01/2011 Document Revised: 01/19/2018 Document Reviewed: 01/19/2018 Elsevier Patient Education  2020 [employer].

## 2019-06-16 NOTE — Assessment & Plan Note (Signed)
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  Patient consumes limited sesame and finned fish.  2020 skin testing was positive to tree nuts, fish mix and sesame.  Negative to peanuts. Interim history - 2020 bloodwork was positive to tree nuts, sesame. Eats peanuts and finned fish with no issues.   Continue strict avoidance of tree nuts, sesame.  For mild symptoms you can take over the counter antihistamines such as Benadryl and monitor symptoms closely. If symptoms worsen or if you have severe symptoms including breathing issues, throat closure, significant swelling, whole body hives, severe diarrhea and vomiting, lightheadedness then inject epinephrine and seek immediate medical care afterwards.  Food action plan in place.

## 2019-06-16 NOTE — Assessment & Plan Note (Signed)
Past history - Diagnosed with asthma as a child.  Ex-smoker. 2020 spirometry was normal however it did show 18% improvement in FEV1 post bronchodilator treatment. Interim history - doing much better with below regimen.  Only had to use albuterol 4 times since the last visit.  Today's spirometry was normal.  Daily controller medication(s):Dulera 200 2 puffs twice a day with spacer and rinse mouth afterwards.   Continue Singulair 10mg  daily. ? Cautioned that in some children/adults can experience behavioral changes including hyperactivity, agitation, depression, sleep disturbances and suicidal ideations. These side effects are rare, but if you notice them you should notify me and discontinue Singulair (montelukast).  Prior to physical activity:May use albuterol rescue inhaler 2 puffs 5 to 15 minutes prior to strenuous physical activities.  Rescue medications:May use albuterol rescue inhaler 2 puffs or nebulizer every 4 to 6 hours as needed for shortness of breath, chest tightness, coughing, and wheezing. Monitor frequency of use  Get spirometry at next visit and if clinical doing well will consider stepping down on therapy.

## 2019-06-16 NOTE — Assessment & Plan Note (Signed)
Past history - Intermittent rhinitis symptoms for the past 20+ years mainly during seasonal changes. 2020 skin testing was negative to environmental allergy panel. Declined intradermal testing. 2020 immunocap positive to dog and borderline to tree pollen.  Interim history - Doing well with prn medications.   Continue environmental control measures.   May need to take the below allergy medications on a daily basis during the spring.  May use over the counter antihistamines such as Zyrtec (cetirizine), Claritin (loratadine), Allegra (fexofenadine), or Xyzal (levocetirizine) daily as needed.  May use Flonase 1 spray per nostril 1-2 times a day as needed for nasal congestion.

## 2019-06-16 NOTE — Progress Notes (Signed)
Follow Up Note  RE: Kathy Hodge MRN: [id number] DOB: 1971-04-06 Date of Office Visit: 06/16/2019  Referring provider: Scot Jun, FNP Primary care provider: Jean Rosenthal, MD  Chief Complaint: Asthma (having some chest tightness possible from acid reflux, still using caffeine)  History of Present Illness: I had the pleasure of seeing Kathy Hodge for a follow up visit at the [facility] on 06/16/2019. She is a 48 y.o. female, who is being followed for asthma, food allergies, allergic rhinitis. Today she is here for regular follow up visit. Her previous allergy office visit was on 05/19/2019 with Dr. Maudie Mercury.   Asthma: Currently on Dulera 200 2 puffs BID with good benefit.  Used albuterol about 4 times since the last visit during exertional related issues. Using Singulair at night with no issues.  Denies any SOB, coughing, wheezing, chest tightness, nocturnal awakenings, ER/urgent care visits or prednisone use since the last visit.  Food allergy: Tried fish and peanuts at home with no issues.  Still avoiding tree nuts and sesame seeds. No reactions since the last visit.   Allergic rhinitis: No dog at home but her son has a dog. Denies any rhinitis symptoms. Takes generic OTC antihistamines and Flonase prn with good benefit.   Assessment and Plan: Kathy Hodge is a 48 y.o. female with: Asthma, well controlled, moderate persistent Past history - Diagnosed with asthma as a child.  Ex-smoker. 2020 spirometry was normal however it did show 18% improvement in FEV1 post bronchodilator treatment. Interim history - doing much better with below regimen.  Only had to use albuterol 4 times since the last visit.  Today's spirometry was normal.  Daily controller medication(s):Dulera 200 2 puffs twice a day with spacer and rinse mouth afterwards.   Continue Singulair 10mg  daily. ? Cautioned that in some children/adults can experience behavioral changes including  hyperactivity, agitation, depression, sleep disturbances and suicidal ideations. These side effects are rare, but if you notice them you should notify me and discontinue Singulair (montelukast).  Prior to physical activity:May use albuterol rescue inhaler 2 puffs 5 to 15 minutes prior to strenuous physical activities.  Rescue medications:May use albuterol rescue inhaler 2 puffs or nebulizer every 4 to 6 hours as needed for shortness of breath, chest tightness, coughing, and wheezing. Monitor frequency of use  Get spirometry at next visit and if clinical doing well will consider stepping down on therapy.   Other allergic rhinitis Past history - Intermittent rhinitis symptoms for the past 20+ years mainly during seasonal changes. 2020 skin testing was negative to environmental allergy panel. Declined intradermal testing. 2020 immunocap positive to dog and borderline to tree pollen.  Interim history - Doing well with prn medications.   Continue environmental control measures.   May need to take the below allergy medications on a daily basis during the spring.  May use over the counter antihistamines such as Zyrtec (cetirizine), Claritin (loratadine), Allegra (fexofenadine), or Xyzal (levocetirizine) daily as needed.  May use Flonase 1 spray per nostril 1-2 times a day as needed for nasal congestion.  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  Patient consumes limited sesame and finned fish.  2020 skin testing was positive to tree nuts, fish mix and sesame.  Negative to peanuts. Interim history - 2020 bloodwork was positive to tree nuts, sesame. Eats peanuts and finned fish with no issues.   Continue strict avoidance of tree nuts,  sesame.  For mild symptoms you can take over the counter antihistamines such as Benadryl and monitor symptoms closely. If symptoms worsen or if you have severe symptoms including  breathing issues, throat closure, significant swelling, whole body hives, severe diarrhea and vomiting, lightheadedness then inject epinephrine and seek immediate medical care afterwards.  Food action plan in place.   Heartburn Takes omeprazole 40mg  daily for heartburn. Drinks coffee in the mornings.   Take omeprazole 40mg  in the morning. Nothing to eat or drink for 30 minutes.  Discussed lifestyle modification for heartburn and gave handout.   Return in about 3 months (around 09/16/2019).  Meds ordered this encounter  Medications  . EPINEPHrine (EPIPEN 2-PAK) 0.3 mg/0.3 mL IJ SOAJ injection    Sig: Inject 0.3 mLs (0.3 mg total) into the muscle once for 1 dose.    Dispense:  0.3 mL    Refill:  1   Lab Orders  No laboratory test(s) ordered today    Diagnostics: Spirometry:  Tracings reviewed. Her effort: Good reproducible efforts. FVC: 2.91L FEV1: 2.25L, 80% predicted FEV1/FVC ratio: 77% Interpretation: Spirometry consistent with normal pattern.  Please see scanned spirometry results for details.  Medication List:  Current Outpatient Medications  Medication Sig Dispense Refill  . albuterol (VENTOLIN HFA) 108 (90 Base) MCG/ACT inhaler INHALE 1 TO 2 PUFFS INTO THE LUNGS EVERY 6 HOURS AS NEEDED FOR WHEEZING OR SHORTNESS OF BREATH 8.5 g 0  . fluticasone (FLONASE) 50 MCG/ACT nasal spray Place 1 spray into both nostrils 2 (two) times daily as needed for allergies or rhinitis. 16 g 5  . mometasone-formoterol (DULERA) 200-5 MCG/ACT AERO Inhale 2 puffs into the lungs 2 (two) times daily. 13 g 5  . montelukast (SINGULAIR) 10 MG tablet Take 1 tablet (10 mg total) by mouth at bedtime. 30 tablet 5  . omeprazole (PRILOSEC) 40 MG capsule Take 1 capsule (40 mg total) by mouth daily. 60 capsule 2  . oxybutynin (DITROPAN-XL) 5 MG 24 hr tablet Take 1 tablet (5 mg total) by mouth at bedtime. 30 tablet 2  . oxyCODONE-acetaminophen (PERCOCET) 10-325 MG tablet Take 1 tablet by mouth every 4 (four)  hours as needed for pain.    . SUMAtriptan (IMITREX) 100 MG tablet Take 1 tablet (100 mg total) by mouth once as needed for up to 1 dose for migraine. May repeat in 2 hours if headache persists or recurs. 10 tablet 2  . tiZANidine (ZANAFLEX) 4 MG tablet Take 1 tablet (4 mg total) by mouth every 6 (six) hours as needed for muscle spasms. 60 tablet 1  . topiramate (TOPAMAX) 100 MG tablet Take 1 tablet (100 mg total) by mouth 2 (two) times daily. 180 tablet 3  . EPINEPHrine (EPIPEN 2-PAK) 0.3 mg/0.3 mL IJ SOAJ injection Inject 0.3 mLs (0.3 mg total) into the muscle once for 1 dose. 0.3 mL 1   No current facility-administered medications for this visit.    Allergies: Allergies  Allergen Reactions  . Peanut-Containing Drug Products Anaphylaxis   I reviewed her past medical history, social history, family history, and environmental history and no significant changes have been reported from her previous visit.  Review of Systems  Constitutional: Negative for appetite change, chills, fever and unexpected weight change.  HENT: Negative for congestion and rhinorrhea.   Eyes: Negative for itching.  Respiratory: Positive for chest tightness and shortness of breath. Negative for cough and wheezing.   Cardiovascular: Negative for chest pain.  Gastrointestinal: Negative for abdominal pain.  Genitourinary: Negative for  difficulty urinating.  Skin: Negative for rash.  Allergic/Immunologic: Positive for environmental allergies and food allergies.  Neurological: Positive for headaches.   Objective: BP (!) 142/80 (BP Location: Left Arm, Patient Position: Sitting, Cuff Size: Large)   Pulse (!) 54   Temp 98 F (36.7 C) (Temporal)   Resp 16   Ht 5' 9.49" (1.765 m)   Wt 236 lb (107 kg)   SpO2 98%   BMI 34.36 kg/m  Body mass index is 34.36 kg/m. Physical Exam  Constitutional: She is oriented to person, place, and time. She appears well-developed and well-nourished.  HENT:  Head: Normocephalic and  atraumatic.  Right Ear: External ear normal.  Left Ear: External ear normal.  Nose: Nose normal.  Mouth/Throat: Oropharynx is clear and moist.  Eyes: Conjunctivae and EOM are normal.  Neck: Neck supple.  Cardiovascular: Normal rate, regular rhythm and normal heart sounds. Exam reveals no gallop and no friction rub.  No murmur heard. Pulmonary/Chest: Effort normal and breath sounds normal. She has no wheezes. She has no rales.  Abdominal: Soft.  Neurological: She is alert and oriented to person, place, and time.  Skin: Skin is warm. No rash noted.  Psychiatric: She has a normal mood and affect. Her behavior is normal.  Nursing note and vitals reviewed.  Previous notes and tests were reviewed. The plan was reviewed with the patient/family, and all questions/concerned were addressed.  It was my pleasure to see Kathy Hodge today and participate in her care. Please feel free to contact me with any questions or concerns.  Sincerely,  Rexene Alberts, DO Allergy & Immunology  [facility] office: [phone] [facility] office: [place] office: [phone]

## 2019-06-16 NOTE — Assessment & Plan Note (Signed)
Takes omeprazole 40mg  daily for heartburn. Drinks coffee in the mornings.   Take omeprazole 40mg  in the morning. Nothing to eat or drink for 30 minutes.  Discussed lifestyle modification for heartburn and gave handout.

## 2019-06-21 ENCOUNTER — Emergency Department (HOSPITAL_BASED_OUTPATIENT_CLINIC_OR_DEPARTMENT_OTHER)
Admission: EM | Admit: 2019-06-21 | Discharge: 2019-06-21 | Disposition: A | Discharge status: Home / Self Care | Payer: 59 | Attending: Emergency Medicine | Admitting: Emergency Medicine

## 2019-06-21 ENCOUNTER — Other Ambulatory Visit: Payer: Self-pay

## 2019-06-21 ENCOUNTER — Encounter (HOSPITAL_BASED_OUTPATIENT_CLINIC_OR_DEPARTMENT_OTHER): Payer: Self-pay | Admitting: *Deleted

## 2019-06-21 DIAGNOSIS — I10 Essential (primary) hypertension: Secondary | ICD-10-CM | POA: Insufficient documentation

## 2019-06-21 DIAGNOSIS — J45909 Unspecified asthma, uncomplicated: Secondary | ICD-10-CM | POA: Diagnosis not present

## 2019-06-21 DIAGNOSIS — G43001 Migraine without aura, not intractable, with status migrainosus: Secondary | ICD-10-CM | POA: Insufficient documentation

## 2019-06-21 DIAGNOSIS — Z79899 Other long term (current) drug therapy: Secondary | ICD-10-CM | POA: Diagnosis not present

## 2019-06-21 DIAGNOSIS — Z87891 Personal history of nicotine dependence: Secondary | ICD-10-CM | POA: Insufficient documentation

## 2019-06-21 DIAGNOSIS — R519 Headache, unspecified: Secondary | ICD-10-CM | POA: Diagnosis present

## 2019-06-21 MED ORDER — PROCHLORPERAZINE EDISYLATE 10 MG/2ML IJ SOLN
10.0000 mg | Freq: Once | INTRAMUSCULAR | Status: AC
  Administered 2019-06-21: 10 mg via INTRAMUSCULAR
  Filled 2019-06-21: qty 2

## 2019-06-21 MED ORDER — IBUPROFEN 600 MG PO TABS: 600.0000 mg | ORAL_TABLET | Freq: Four times a day (QID) | ORAL | 0 refills | Status: DC | PRN

## 2019-06-21 MED ORDER — METOCLOPRAMIDE HCL 10 MG PO TABS: 10.0000 mg | ORAL_TABLET | Freq: Four times a day (QID) | ORAL | 0 refills | Status: DC | PRN

## 2019-06-21 MED ORDER — DIPHENHYDRAMINE HCL 25 MG PO TABS: 25.0000 mg | ORAL_TABLET | Freq: Four times a day (QID) | ORAL | 0 refills | Status: DC

## 2019-06-21 MED ORDER — KETOROLAC TROMETHAMINE 60 MG/2ML IM SOLN
60.0000 mg | Freq: Once | INTRAMUSCULAR | Status: AC
  Administered 2019-06-21: 60 mg via INTRAMUSCULAR
  Filled 2019-06-21: qty 2

## 2019-06-21 MED ORDER — DIPHENHYDRAMINE HCL 50 MG/ML IJ SOLN
25.0000 mg | Freq: Once | INTRAMUSCULAR | Status: AC
  Administered 2019-06-21: 25 mg via INTRAMUSCULAR
  Filled 2019-06-21: qty 1

## 2019-06-21 NOTE — ED Triage Notes (Signed)
Headache, eye pain, left rib pain x 4 days. Denies cough.

## 2019-06-21 NOTE — ED Notes (Signed)
ED Provider at bedside. 

## 2019-06-21 NOTE — Discharge Instructions (Signed)
1.  You may take a combination of ibuprofen, Benadryl and Reglan for a migraine headache.  Do not take more than 3 doses in a 24-hour. 2.  Schedule appointment with your neurologist for recheck as soon as possible.
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
## 2019-06-21 NOTE — ED Notes (Signed)
Pt states she has been taking home medication for her headache with worsening [ain over the last week and pain goping into her left eye with some increasing dizziness for the last two days.

## 2019-06-28 ENCOUNTER — Other Ambulatory Visit: Payer: Self-pay | Admitting: Internal Medicine

## 2019-07-05 ENCOUNTER — Encounter: Payer: Self-pay | Admitting: Internal Medicine

## 2019-07-05 ENCOUNTER — Other Ambulatory Visit: Payer: Self-pay

## 2019-07-05 ENCOUNTER — Ambulatory Visit (INDEPENDENT_AMBULATORY_CARE_PROVIDER_SITE_OTHER): Payer: 59 | Admitting: Internal Medicine

## 2019-07-05 VITALS — BP 141/86 | HR 61 | Temp 98.4°F | Ht 69.0 in | Wt 229.6 lb

## 2019-07-05 DIAGNOSIS — K219 Gastro-esophageal reflux disease without esophagitis: Secondary | ICD-10-CM

## 2019-07-05 DIAGNOSIS — K2100 Gastro-esophageal reflux disease with esophagitis, without bleeding: Secondary | ICD-10-CM

## 2019-07-05 DIAGNOSIS — Z0289 Encounter for other administrative examinations: Secondary | ICD-10-CM

## 2019-07-05 DIAGNOSIS — E669 Obesity, unspecified: Secondary | ICD-10-CM | POA: Insufficient documentation

## 2019-07-05 DIAGNOSIS — Z79899 Other long term (current) drug therapy: Secondary | ICD-10-CM | POA: Diagnosis not present

## 2019-07-05 DIAGNOSIS — J029 Acute pharyngitis, unspecified: Secondary | ICD-10-CM

## 2019-07-05 DIAGNOSIS — Z131 Encounter for screening for diabetes mellitus: Secondary | ICD-10-CM

## 2019-07-05 DIAGNOSIS — G43909 Migraine, unspecified, not intractable, without status migrainosus: Secondary | ICD-10-CM | POA: Diagnosis not present

## 2019-07-05 LAB — POCT GLYCOSYLATED HEMOGLOBIN (HGB A1C): Hemoglobin A1C: 5.1 % (ref 4.0–5.6)

## 2019-07-05 LAB — GLUCOSE, CAPILLARY: Glucose-Capillary: 94 mg/dL (ref 70–99)

## 2019-07-05 MED ORDER — RANITIDINE HCL 300 MG PO TABS: 300.0000 mg | ORAL_TABLET | Freq: Every day | ORAL | 1 refills | Status: DC
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
## 2019-07-05 NOTE — Assessment & Plan Note (Signed)
Patient reports continued sore throat, approximately 4 months duration.  Occasional pain with swallowing, no cough, congestion.  Patient reports concurrent symptoms of acid reflux.  Patient previously trialed on nystatin mouthwash and consideration of candidal infection.  Also trialed on omeprazole for the past 4 weeks which she continues to take without improvement in sore throat or reflux symptoms. * Will start 2-week course of ranitidine 300 mg nightly. * Continue omeprazole therapy

## 2019-07-05 NOTE — Assessment & Plan Note (Signed)
Patient has chronic history of migraines for which she sees a neurologist and is currently on Topamax for prophylaxis.  Patient describes current headache that is similar to prior headaches and quality with photophobia and sensitivity to noise.  Patient with follow-up appointment with neurology.

## 2019-07-05 NOTE — Patient Instructions (Signed)
You were seen to discuss approval for bariatric surgery.  We collected lab work to test for diabetes, and we will call you with this result.  We also discussed your continued sore throat and headaches.  For your sore throat, I have sent a prescription for ranitidine to your pharmacy.  Take this for the next 2 weeks.  If it works, but the sore throat returns after completion of therapy please call the clinic and we can send another prescription.  For your headaches, the neurologist may need to adjust your medications.  Thank you for allowing [place] to be part of your medical care.

## 2019-07-06 ENCOUNTER — Encounter: Payer: Self-pay | Admitting: Internal Medicine

## 2019-07-06 ENCOUNTER — Other Ambulatory Visit: Payer: Self-pay | Admitting: Internal Medicine

## 2019-07-06 ENCOUNTER — Other Ambulatory Visit: Payer: Self-pay | Admitting: Pulmonary Disease

## 2019-07-06 DIAGNOSIS — K219 Gastro-esophageal reflux disease without esophagitis: Secondary | ICD-10-CM

## 2019-07-06 MED ORDER — FAMOTIDINE 20 MG PO TABS: 20.0000 mg | ORAL_TABLET | Freq: Two times a day (BID) | ORAL | 0 refills | Status: DC

## 2019-07-06 NOTE — Progress Notes (Signed)
Internal Medicine Clinic Attending  I saw and evaluated the patient.  I personally confirmed the key portions of the history and exam documented by Dr. MacLean and I reviewed pertinent patient test results.  The assessment, diagnosis, and plan were formulated together and I agree with the documentation in the resident's note.  

## 2019-07-07 ENCOUNTER — Other Ambulatory Visit: Payer: Self-pay | Admitting: Internal Medicine

## 2019-07-07 ENCOUNTER — Encounter: Payer: Self-pay | Admitting: Internal Medicine

## 2019-07-07 DIAGNOSIS — J029 Acute pharyngitis, unspecified: Secondary | ICD-10-CM

## 2019-07-07 DIAGNOSIS — K2100 Gastro-esophageal reflux disease with esophagitis, without bleeding: Secondary | ICD-10-CM

## 2019-07-07 NOTE — Assessment & Plan Note (Signed)
Referral sent to gastroenterology for patient's continued sore throat in setting of reflux

## 2019-07-08 ENCOUNTER — Telehealth: Payer: Self-pay | Admitting: Pulmonary Disease

## 2019-07-08 ENCOUNTER — Other Ambulatory Visit: Payer: Self-pay

## 2019-07-08 DIAGNOSIS — Z20822 Contact with and (suspected) exposure to covid-19: Secondary | ICD-10-CM

## 2019-07-08 NOTE — Telephone Encounter (Signed)
Spoke with pt. She was wanting to let us know that she has tested positive for COVID. Pt developed at sore throat over the weekend and was tested. She did not need anything from [place], she just wanted to make [place] aware. Nothing further was needed.

## 2019-07-09 LAB — NOVEL CORONAVIRUS, NAA: SARS-CoV-2, NAA: DETECTED — AB

## 2019-07-13 ENCOUNTER — Encounter: Payer: Self-pay | Admitting: Nurse Practitioner

## 2019-07-13 ENCOUNTER — Encounter: Payer: Self-pay | Admitting: Internal Medicine

## 2019-07-18 ENCOUNTER — Encounter: Payer: Self-pay | Admitting: Internal Medicine

## 2019-07-20 ENCOUNTER — Other Ambulatory Visit: Payer: Self-pay

## 2019-07-20 ENCOUNTER — Ambulatory Visit: Payer: 59

## 2019-07-20 ENCOUNTER — Telehealth: Payer: Self-pay | Admitting: *Deleted

## 2019-07-20 NOTE — Telephone Encounter (Signed)
Per hme, triage called pt and scheduled face to face with dr Benjamine Mola 11/18 at 100, pt was agreeable

## 2019-07-21 ENCOUNTER — Ambulatory Visit (INDEPENDENT_AMBULATORY_CARE_PROVIDER_SITE_OTHER): Payer: 59 | Admitting: Internal Medicine

## 2019-07-21 ENCOUNTER — Other Ambulatory Visit (INDEPENDENT_AMBULATORY_CARE_PROVIDER_SITE_OTHER): Payer: 59

## 2019-07-21 ENCOUNTER — Encounter: Payer: Self-pay | Admitting: Nurse Practitioner

## 2019-07-21 ENCOUNTER — Other Ambulatory Visit: Payer: Self-pay

## 2019-07-21 ENCOUNTER — Ambulatory Visit (INDEPENDENT_AMBULATORY_CARE_PROVIDER_SITE_OTHER): Payer: 59 | Admitting: Nurse Practitioner

## 2019-07-21 VITALS — BP 148/89 | HR 73 | Temp 98.4°F | Wt 223.8 lb

## 2019-07-21 VITALS — BP 142/82 | HR 74 | Temp 98.8°F | Ht 69.0 in | Wt 222.0 lb

## 2019-07-21 DIAGNOSIS — Z1211 Encounter for screening for malignant neoplasm of colon: Secondary | ICD-10-CM

## 2019-07-21 DIAGNOSIS — R1011 Right upper quadrant pain: Secondary | ICD-10-CM

## 2019-07-21 DIAGNOSIS — R1013 Epigastric pain: Secondary | ICD-10-CM

## 2019-07-21 DIAGNOSIS — Z1212 Encounter for screening for malignant neoplasm of rectum: Secondary | ICD-10-CM | POA: Diagnosis not present

## 2019-07-21 DIAGNOSIS — Z1159 Encounter for screening for other viral diseases: Secondary | ICD-10-CM

## 2019-07-21 DIAGNOSIS — U071 COVID-19: Secondary | ICD-10-CM | POA: Insufficient documentation

## 2019-07-21 DIAGNOSIS — I10 Essential (primary) hypertension: Secondary | ICD-10-CM

## 2019-07-21 DIAGNOSIS — Z8619 Personal history of other infectious and parasitic diseases: Secondary | ICD-10-CM

## 2019-07-21 DIAGNOSIS — R109 Unspecified abdominal pain: Secondary | ICD-10-CM | POA: Insufficient documentation

## 2019-07-21 DIAGNOSIS — Z79899 Other long term (current) drug therapy: Secondary | ICD-10-CM

## 2019-07-21 LAB — HEPATIC FUNCTION PANEL
ALT: 39 U/L — ABNORMAL HIGH (ref 0–35)
AST: 20 U/L (ref 0–37)
Albumin: 4.2 g/dL (ref 3.5–5.2)
Alkaline Phosphatase: 78 U/L (ref 39–117)
Bilirubin, Direct: 0.1 mg/dL (ref 0.0–0.3)
Total Bilirubin: 0.4 mg/dL (ref 0.2–1.2)
Total Protein: 7.3 g/dL (ref 6.0–8.3)

## 2019-07-21 LAB — LIPASE: Lipase: 46 U/L (ref 11.0–59.0)

## 2019-07-21 MED ORDER — NA SULFATE-K SULFATE-MG SULF 17.5-3.13-1.6 GM/177ML PO SOLN: ORAL | 0 refills | Status: DC

## 2019-07-21 NOTE — Assessment & Plan Note (Addendum)
Patient reports several month history of abdominal pain. This was previously thought to be acid reflux pain. However, patient denies burning sensation, or history of reflux-type discomfort. Pain is episodic, lasting a few minutes at a time. On physical exam, pain is located in epigastric and RUQ region. Patient has additional risk factors for biliary colic of weight and age.  * RUQ ultrasound ordered * Patient scheduled an appointment with GI doctors for this afternoon * Instructed patient to discontinue omeprazole and famotidine for now given that Kathy Hodge has not had typical reflux symptoms nor relief with this therapy

## 2019-07-21 NOTE — Patient Instructions (Addendum)
You were seen for follow-up after your COVID-19 infection.  You are doing very well following your infection with no residual symptoms.  On examination, your lungs sound completely normal.  Regarding your abdominal pain, it sounds like this pain may be coming from your gallbladder.  We have ordered a right upper quadrant ultrasound to see if there are stones in your gallbladder causing his pain.  Please stop taking your famotidine and omeprazole.  Given that your pain does not sound like reflux pain, and your sore throat has improved, we will see how your symptoms do after stopping these drugs.  Thank you for allowing [place] to be part of your medical care!  Please call the clinic or message on MyChart if you have any questions.

## 2019-07-21 NOTE — Progress Notes (Signed)
Internal Medicine Clinic Attending  I saw and evaluated the patient.  I personally confirmed the key portions of the history and exam documented by Dr. MacLean and I reviewed pertinent patient test results.  The assessment, diagnosis, and plan were formulated together and I agree with the documentation in the resident's note.  
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
## 2019-07-21 NOTE — Patient Instructions (Addendum)
If you are age 49 or older, your body mass index should be between 23-30. Your Body mass index is 32.78 kg/m. If this is out of the aforementioned range listed, please consider follow up with your Primary Care Provider.  If you are age 36 or younger, your body mass index should be between 19-25. Your Body mass index is 32.78 kg/m. If this is out of the aformentioned range listed, please consider follow up with your Primary Care Provider.   You have been scheduled for a colonoscopy. Please follow written instructions given to you at your visit today.  Please pick up your prep supplies at the pharmacy within the next 1-3 days. If you use inhalers (even only as needed), please bring them with you on the day of your procedure. Your physician has requested that you go to www.startemmi.com and enter the access code given to you at your visit today. This web site gives a general overview about your procedure. However, you should still follow specific instructions given to you by our office regarding your preparation for the procedure.  We have sent the following medications to your pharmacy for you to pick up at your convenience: [place] provider has requested that you go to the basement level for lab work before leaving today. Press "B" on the elevator. The lab is located at the first door on the left as you exit the elevator.   Thank you for choosing me and [facility].   Tye Savoy, NP

## 2019-07-21 NOTE — Assessment & Plan Note (Addendum)
Patient with onset of worsening sore throat on 07/06/19. Patient reports getting tested twice (on approximately 07/07/19) for COVID-19 with both tests positive. Patient states she isolated and informed prior close contacts to get tested. Patient also experienced two days of diarrhea, mild cough, and had temperature to 101F on 11/10, 100F on 11/11. Patient currently symptom free. Lungs clear to auscultation.  Per CDC recommendations, no need for further isolation, beyond the generally advised social distancing recommendations

## 2019-07-21 NOTE — Assessment & Plan Note (Addendum)
BP Readings from Last 3 Encounters:  07/21/19 (!) 148/89  07/05/19 (!) 141/86  06/21/19 127/74   Patient reports that she takes amlodipine 10 mg daily + hydrochlorothiazide 2.5 mg? (likely 12.5 mg).  These medications not listed in her med list although she was previously on amlodipine 10 mg and HCTZ 12.5 mg which was discontinued on 9/18 - unclear reason. Blood pressure is elevated at this visit but patient states she did not take her blood pressure medications this morning. * Requested that patient bring all medications to next visit
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
## 2019-07-22 ENCOUNTER — Telehealth: Payer: Self-pay

## 2019-07-22 NOTE — Telephone Encounter (Signed)
Left message on patients voicemail making her aware since she recently tested positive for Covid she does not need to be retested on 08/09/19 prior to procedure on 08/11/19.  Covid test canceled.

## 2019-07-23 ENCOUNTER — Encounter: Payer: Self-pay | Admitting: Nurse Practitioner

## 2019-07-24 NOTE — Progress Notes (Signed)
Agree 

## 2019-07-26 ENCOUNTER — Telehealth: Payer: Self-pay | Admitting: *Deleted

## 2019-07-26 NOTE — Telephone Encounter (Signed)
See my chart messages; patient reports continuing headache despite medications. She stated she stopped all OTC headache medicines "to see if she could get rid of headaches" because a provider had told her about rebound headaches. She still suffers from headaches. Sumatriptan is no longer helping. I advised the NP stated she can Rx Nurtec to take as needed. I advised she stay well hydrated, scheduled her for soonest office visit per A Lomax, NP and put her on wait list, high priority.  Confirmed pharmacy. Patient verbalized understanding, appreciation.

## 2019-07-27 ENCOUNTER — Encounter: Payer: Self-pay | Admitting: Pulmonary Disease

## 2019-07-27 ENCOUNTER — Ambulatory Visit (INDEPENDENT_AMBULATORY_CARE_PROVIDER_SITE_OTHER): Payer: 59 | Admitting: Pulmonary Disease

## 2019-07-27 ENCOUNTER — Other Ambulatory Visit: Payer: Self-pay

## 2019-07-27 ENCOUNTER — Other Ambulatory Visit: Payer: Self-pay | Admitting: Pulmonary Disease

## 2019-07-27 DIAGNOSIS — G4733 Obstructive sleep apnea (adult) (pediatric): Secondary | ICD-10-CM

## 2019-07-27 DIAGNOSIS — Z9989 Dependence on other enabling machines and devices: Secondary | ICD-10-CM

## 2019-07-27 DIAGNOSIS — G4709 Other insomnia: Secondary | ICD-10-CM

## 2019-07-27 MED ORDER — ZOLPIDEM TARTRATE 10 MG PO TABS: ORAL_TABLET | ORAL | 1 refills | Status: DC

## 2019-07-27 MED ORDER — ZOLPIDEM TARTRATE 10 MG PO TABS: ORAL_TABLET | ORAL | 2 refills | Status: DC

## 2019-07-27 MED ORDER — NURTEC 75 MG PO TBDP: 75.0000 mg | ORAL_TABLET | Freq: Every day | ORAL | 11 refills | Status: DC | PRN

## 2019-07-27 NOTE — Addendum Note (Signed)
Addended byUbaldo Glassing, Caroljean Monsivais L on: 07/27/2019 07:50 AM   Modules accepted: Orders

## 2019-07-27 NOTE — Progress Notes (Signed)
Virtual Visit via Telephone Note  I connected with Kathy Hodge on 07/27/19 at  9:45 AM EST by telephone and verified that I am speaking with the correct person using two identifiers.  Location: Patient: Kathy Hodge Provider: Adrian Prince discussed the limitations, risks, security and privacy concerns of performing an evaluation and management service by telephone and the availability of in person appointments. I also discussed with the patient that there may be a patient responsible charge related to this service. The patient expressed understanding and agreed to proceed.   History of Present Illness: No significant complaints Has not been initiated on CPAP yet  She was switched to [place] during the last office visit, this is not working as well as MDM  Diagnosed with Covid on 11/3-had minimal symptoms No other people around her with Covid as far as she knows  She feels relatively well at present No other significant health issues   Observations/Objective: Feels good on sounded good on the phone  Assessment and Plan: Recent Covid infection  Insomnia-switch back to Ambien  Follow-up with medical supply company regarding set up for CPAP  Follow Up Instructions:    I discussed the assessment and treatment plan with the patient. The patient was provided an opportunity to ask questions and all were answered. The patient agreed with the plan and demonstrated an understanding of the instructions.   The patient was advised to call back or seek an in-person evaluation if the symptoms worsen or if the condition fails to improve as anticipated.  I provided 12 minutes of non-face-to-face time during this encounter.  Follow-up visit in 3 months  Adewale Clearence Ped, MD

## 2019-07-27 NOTE — Patient Instructions (Signed)
Switch back to Ambien, discontinue [place] for CPAP set up  Follow-up visit in 3 months

## 2019-07-27 NOTE — Addendum Note (Signed)
Addended by: Hildred Alamin I on: 07/27/2019 10:16 AM   Modules accepted: Orders

## 2019-07-28 ENCOUNTER — Telehealth: Payer: Self-pay

## 2019-07-28 NOTE — Telephone Encounter (Signed)
Started PA For Nurtec Tablets 75 MG ODT.  Key: [id number]  Medication was approved.

## 2019-08-03 ENCOUNTER — Ambulatory Visit
Admission: RE | Admit: 2019-08-03 | Discharge: 2019-08-03 | Disposition: A | Discharge status: Home / Self Care | Payer: 59 | Source: Ambulatory Visit | Attending: Internal Medicine | Admitting: Internal Medicine

## 2019-08-03 ENCOUNTER — Other Ambulatory Visit: Payer: Self-pay

## 2019-08-03 DIAGNOSIS — Z1239 Encounter for other screening for malignant neoplasm of breast: Secondary | ICD-10-CM

## 2019-08-04 ENCOUNTER — Other Ambulatory Visit: Payer: Self-pay | Admitting: Internal Medicine

## 2019-08-05 ENCOUNTER — Other Ambulatory Visit: Payer: Self-pay

## 2019-08-05 ENCOUNTER — Emergency Department (HOSPITAL_BASED_OUTPATIENT_CLINIC_OR_DEPARTMENT_OTHER): Payer: 59

## 2019-08-05 ENCOUNTER — Encounter (HOSPITAL_BASED_OUTPATIENT_CLINIC_OR_DEPARTMENT_OTHER): Payer: Self-pay

## 2019-08-05 ENCOUNTER — Emergency Department (HOSPITAL_BASED_OUTPATIENT_CLINIC_OR_DEPARTMENT_OTHER)
Admission: EM | Admit: 2019-08-05 | Discharge: 2019-08-05 | Disposition: A | Discharge status: Home / Self Care | Payer: 59 | Attending: Emergency Medicine | Admitting: Emergency Medicine

## 2019-08-05 DIAGNOSIS — R079 Chest pain, unspecified: Secondary | ICD-10-CM

## 2019-08-05 DIAGNOSIS — R1013 Epigastric pain: Secondary | ICD-10-CM | POA: Diagnosis not present

## 2019-08-05 DIAGNOSIS — J45909 Unspecified asthma, uncomplicated: Secondary | ICD-10-CM | POA: Diagnosis not present

## 2019-08-05 DIAGNOSIS — Z91010 Allergy to peanuts: Secondary | ICD-10-CM | POA: Diagnosis not present

## 2019-08-05 DIAGNOSIS — R0602 Shortness of breath: Secondary | ICD-10-CM | POA: Insufficient documentation

## 2019-08-05 DIAGNOSIS — R0789 Other chest pain: Secondary | ICD-10-CM | POA: Diagnosis present

## 2019-08-05 DIAGNOSIS — Z87891 Personal history of nicotine dependence: Secondary | ICD-10-CM | POA: Diagnosis not present

## 2019-08-05 DIAGNOSIS — Z79899 Other long term (current) drug therapy: Secondary | ICD-10-CM | POA: Diagnosis not present

## 2019-08-05 DIAGNOSIS — I10 Essential (primary) hypertension: Secondary | ICD-10-CM | POA: Insufficient documentation

## 2019-08-05 LAB — COMPREHENSIVE METABOLIC PANEL
ALT: 20 U/L (ref 0–44)
AST: 14 U/L — ABNORMAL LOW (ref 15–41)
Albumin: 4 g/dL (ref 3.5–5.0)
Alkaline Phosphatase: 75 U/L (ref 38–126)
Anion gap: 7 (ref 5–15)
BUN: 14 mg/dL (ref 6–20)
CO2: 23 mmol/L (ref 22–32)
Calcium: 9.3 mg/dL (ref 8.9–10.3)
Chloride: 109 mmol/L (ref 98–111)
Creatinine, Ser: 0.62 mg/dL (ref 0.44–1.00)
GFR calc Af Amer: >60 mL/min (ref >60)
GFR calc non Af Amer: >60 mL/min (ref >60)
Glucose, Bld: 90 mg/dL (ref 70–99)
Potassium: 3.8 mmol/L (ref 3.5–5.1)
Sodium: 139 mmol/L (ref 135–145)
Total Bilirubin: 0.9 mg/dL (ref 0.3–1.2)
Total Protein: 7.7 g/dL (ref 6.5–8.1)

## 2019-08-05 LAB — CBC WITH DIFFERENTIAL/PLATELET
Abs Immature Granulocytes: 0 10*3/uL (ref 0.00–0.07)
Basophils Absolute: 0 10*3/uL (ref 0.0–0.1)
Basophils Relative: 1 %
Eosinophils Absolute: 0.1 10*3/uL (ref 0.0–0.5)
Eosinophils Relative: 1 %
HCT: 40.1 % (ref 36.0–46.0)
Hemoglobin: 12.4 g/dL (ref 12.0–15.0)
Immature Granulocytes: 0 %
Lymphocytes Relative: 32 %
Lymphs Abs: 1.3 10*3/uL (ref 0.7–4.0)
MCH: 27.6 pg (ref 26.0–34.0)
MCHC: 30.9 g/dL (ref 30.0–36.0)
MCV: 89.3 fL (ref 80.0–100.0)
Monocytes Absolute: 0.4 10*3/uL (ref 0.1–1.0)
Monocytes Relative: 10 %
Neutro Abs: 2.4 10*3/uL (ref 1.7–7.7)
Neutrophils Relative %: 56 %
Platelets: 272 10*3/uL (ref 150–400)
RBC: 4.49 MIL/uL (ref 3.87–5.11)
RDW: 13.8 % (ref 11.5–15.5)
WBC: 4.2 10*3/uL (ref 4.0–10.5)
nRBC: 0 % (ref 0.0–0.2)

## 2019-08-05 LAB — D-DIMER, QUANTITATIVE: D-Dimer, Quant: 0.36 ug/mL-FEU (ref 0.00–0.50)

## 2019-08-05 LAB — LIPASE, BLOOD: Lipase: 27 U/L (ref 11–51)

## 2019-08-05 LAB — TROPONIN I (HIGH SENSITIVITY): Troponin I (High Sensitivity): 2 ng/L (ref <18)

## 2019-08-05 LAB — BRAIN NATRIURETIC PEPTIDE: B Natriuretic Peptide: 64.8 pg/mL (ref 0.0–100.0)
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
## 2019-08-05 NOTE — ED Notes (Signed)
Patient transported to Ultrasound 

## 2019-08-05 NOTE — Discharge Instructions (Addendum)
As discussed, all of your labs today and images were unremarkable. I would suspect your chest pain is related to your recent COVID infection that can cause inflammation to your chest wall. You may take over the counter ibuprofen as needed for pain. Follow-up with your PCP within the next week to evaluate improvement in symptoms. Return to the ER for new or worsening symptoms.

## 2019-08-05 NOTE — ED Triage Notes (Addendum)
Pt states left sided chest pain for past 3 days, worse when lying down.  Comes and goes, worse now, described as sharp.  Radiates to left shoulder at times. Had recovered from covid one month ago.

## 2019-08-05 NOTE — ED Notes (Signed)
Cp off and on x 3 days  Worse w movement

## 2019-08-09 ENCOUNTER — Ambulatory Visit (HOSPITAL_COMMUNITY): Admission: RE | Admit: 2019-08-09 | Payer: 59 | Source: Ambulatory Visit

## 2019-08-10 ENCOUNTER — Encounter: Payer: Self-pay | Admitting: Internal Medicine

## 2019-08-11 ENCOUNTER — Other Ambulatory Visit: Payer: Self-pay

## 2019-08-11 ENCOUNTER — Encounter: Payer: 59 | Admitting: Internal Medicine

## 2019-08-11 NOTE — Telephone Encounter (Signed)
Called pt. This morning.  Patient has schedule an appointment with [facility] for tomorrow 08/12/2019 @ 10:45am to f/u with her Foot Pain.

## 2019-08-12 ENCOUNTER — Other Ambulatory Visit: Payer: Self-pay

## 2019-08-12 ENCOUNTER — Ambulatory Visit (HOSPITAL_COMMUNITY)
Admission: RE | Admit: 2019-08-12 | Discharge: 2019-08-12 | Disposition: A | Discharge status: Home / Self Care | Payer: 59 | Source: Ambulatory Visit | Attending: Internal Medicine | Admitting: Internal Medicine

## 2019-08-12 ENCOUNTER — Ambulatory Visit (INDEPENDENT_AMBULATORY_CARE_PROVIDER_SITE_OTHER): Payer: 59 | Admitting: Internal Medicine

## 2019-08-12 ENCOUNTER — Encounter: Payer: Self-pay | Admitting: Internal Medicine

## 2019-08-12 DIAGNOSIS — M7989 Other specified soft tissue disorders: Secondary | ICD-10-CM | POA: Diagnosis not present

## 2019-08-12 DIAGNOSIS — M25571 Pain in right ankle and joints of right foot: Secondary | ICD-10-CM | POA: Insufficient documentation

## 2019-08-12 NOTE — Assessment & Plan Note (Addendum)
Patient presents with 1 month of intermittent right lateral ankle pain. She does not recall any trauma preceding the pain. She has noticed that the pain and swelling is ilicited by repeat movement/use of the ankle. The pain typically last 24-48 hrs and is accompanied by swelling anterior and just distal to the lateral malleolus. She soaks her feet in warm water when this occurs and uses OTC pain medications with her prescription pain medications. She has never had gout or pseudogout. She denies fevers or other systemic signs of infection.   On physicaly exam there is an area of tenderness and swelling over the insertion site of the anterior talofibular ligament. There is pain elicited with inversion of the foot. There is tenderness to palpation of the head of the 5th metatarsal.   A/P: - DG ankle obtained to rule out acute fracture. DG returned negative.  - Discussed that he pain is likely from a sprain of the anterior talofibular ligament - Recommended conservative measure and compression. She voices understanding.

## 2019-08-12 NOTE — Patient Instructions (Signed)
Thank you for allowing [place] to provide your care. Today we're gonna get an x-ray of your ankle just to make sure that we're not missing an issue with your bones. I will call you once I have these results available. In the meantime I would like you to go by an ace bandage or an ankle brace for your right ankle. Please try to stay off of it as much as possible. If your pain has not improved in the next 3 to 4 weeks please call us.
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
## 2019-08-13 ENCOUNTER — Telehealth: Payer: Self-pay | Admitting: Internal Medicine

## 2019-08-13 NOTE — Telephone Encounter (Signed)
Call back from pt ; stated Dr Tarri Abernethy had called her this am . Informed "her x-rays were negative for any acute fractures. She should continue compression wrappings of her ankle and rest as much as possible." per Dr Tarri Abernethy. Verbalized understanding.

## 2019-08-13 NOTE — Telephone Encounter (Signed)
Attempted to call the patient to discuss her x-rays results. She did not answer. HIPAA compliant message left asking her to call us back. If she calls back to the clinic please inform her that her x-rays were negative for any acute fractures. She should continue compression wrappings of her ankle and rest as much as possible.  Thank you Ina Homes, MD

## 2019-08-16 ENCOUNTER — Ambulatory Visit (AMBULATORY_SURGERY_CENTER): Payer: 59 | Admitting: Internal Medicine

## 2019-08-16 ENCOUNTER — Encounter: Payer: Self-pay | Admitting: Internal Medicine

## 2019-08-16 ENCOUNTER — Other Ambulatory Visit: Payer: Self-pay

## 2019-08-16 ENCOUNTER — Encounter: Payer: 59 | Admitting: Internal Medicine

## 2019-08-16 VITALS — BP 135/84 | HR 51 | Temp 98.0°F | Resp 15 | Ht 69.0 in | Wt 222.0 lb

## 2019-08-16 DIAGNOSIS — R1011 Right upper quadrant pain: Secondary | ICD-10-CM | POA: Diagnosis not present

## 2019-08-16 DIAGNOSIS — Z1211 Encounter for screening for malignant neoplasm of colon: Secondary | ICD-10-CM | POA: Diagnosis not present

## 2019-08-16 DIAGNOSIS — D122 Benign neoplasm of ascending colon: Secondary | ICD-10-CM

## 2019-08-16 DIAGNOSIS — D124 Benign neoplasm of descending colon: Secondary | ICD-10-CM | POA: Diagnosis not present

## 2019-08-16 MED ORDER — SODIUM CHLORIDE 0.9 % IV SOLN: 500.0000 mL | Freq: Once | INTRAVENOUS | Status: DC

## 2019-08-16 NOTE — Progress Notes (Signed)
Called to room to assist during endoscopic procedure.  Patient ID and intended procedure confirmed with present staff. Received instructions for my participation in the procedure from the performing physician.  

## 2019-08-16 NOTE — Op Note (Signed)
[facility] Patient Name: Kathy Hodge Procedure Date: 08/16/2019 3:27 PM MRN: [id number] Endoscopist: Docia Chuck. Henrene Pastor , MD Age: 48 Referring MD:  Date of Birth: 1970-12-24 Gender: Female Account #: [id number] Procedure:                Upper GI endoscopy Indications:              Abdominal pain in the right upper quadrant.                            Evaluated for this about 4 weeks ago. Negative                            laboratories and ultrasound. Currently, actually                            feeling better. Medicines:                Monitored Anesthesia Care Procedure:                Pre-Anesthesia Assessment:                           - Prior to the procedure, a History and Physical                            was performed, and patient medications and                            allergies were reviewed. The patient's tolerance of                            previous anesthesia was also reviewed. The risks                            and benefits of the procedure and the sedation                            options and risks were discussed with the patient.                            All questions were answered, and informed consent                            was obtained. Prior Anticoagulants: The patient has                            taken no previous anticoagulant or antiplatelet                            agents. ASA Grade Assessment: II - A patient with                            mild systemic disease. After reviewing the risks  and benefits, the patient was deemed in                            satisfactory condition to undergo the procedure.                           After obtaining informed consent, the endoscope was                            passed under direct vision. Throughout the                            procedure, the patient's blood pressure, pulse, and                            oxygen saturations were monitored continuously. The                             Endoscope was introduced through the mouth, and                            advanced to the second part of duodenum. The upper                            GI endoscopy was accomplished without difficulty.                            The patient tolerated the procedure well. Scope In: Scope Out: Findings:                 The esophagus was normal.                           The stomach was normal.                           The examined duodenum was normal.                           The cardia and gastric fundus were normal on                            retroflexion. Complications:            No immediate complications. Estimated Blood Loss:     Estimated blood loss: none. Impression:               - Normal esophagus.                           - Normal stomach.                           - Normal examined duodenum.                           - No specimens collected. Recommendation:           -  Patient has a contact number available for                            emergencies. The signs and symptoms of potential                            delayed complications were discussed with the                            patient. Return to normal activities tomorrow.                            Written discharge instructions were provided to the                            patient.                           - Resume previous diet.                           - Continue present medications.                           -Return to the care of your primary provider. GI                            follow-up as needed Docia Chuck. Henrene Pastor, MD 08/16/2019 4:02:08 PM This report has been signed electronically.

## 2019-08-16 NOTE — Patient Instructions (Signed)
Discharge instructions given. Handout on polyps. Normal endoscopy. Resume previous medications. YOU HAD AN ENDOSCOPIC PROCEDURE TODAY AT [facility]:   Refer to the procedure report that was given to you for any specific questions about what was found during the examination.  If the procedure report does not answer your questions, please call your gastroenterologist to clarify.  If you requested that your care partner not be given the details of your procedure findings, then the procedure report has been included in a sealed envelope for you to review at your convenience later.  YOU SHOULD EXPECT: Some feelings of bloating in the abdomen. Passage of more gas than usual.  Walking can help get rid of the air that was put into your GI tract during the procedure and reduce the bloating. If you had a lower endoscopy (such as a colonoscopy or flexible sigmoidoscopy) you may notice spotting of blood in your stool or on the toilet paper. If you underwent a bowel prep for your procedure, you may not have a normal bowel movement for a few days.  Please Note:  You might notice some irritation and congestion in your nose or some drainage.  This is from the oxygen used during your procedure.  There is no need for concern and it should clear up in a day or so.  SYMPTOMS TO REPORT IMMEDIATELY:   Following lower endoscopy (colonoscopy or flexible sigmoidoscopy):  Excessive amounts of blood in the stool  Significant tenderness or worsening of abdominal pains  Swelling of the abdomen that is new, acute  Fever of 100F or higher   Following upper endoscopy (EGD)  Vomiting of blood or coffee ground material  New chest pain or pain under the shoulder blades  Painful or persistently difficult swallowing  New shortness of breath  Fever of 100F or higher  Black, tarry-looking stools  For urgent or emergent issues, a gastroenterologist can be reached at any hour by calling [phone]  [phone].   DIET:  We do recommend a small meal at first, but then you may proceed to your regular diet.  Drink plenty of fluids but you should avoid alcoholic beverages for 24 hours.  ACTIVITY:  You should plan to take it easy for the rest of today and you should NOT DRIVE or use heavy machinery until tomorrow (because of the sedation medicines used during the test).    FOLLOW UP: Our staff will call the number listed on your records 48-72 hours following your procedure to check on you and address any questions or concerns that you may have regarding the information given to you following your procedure. If we do not reach you, we will leave a message.  We will attempt to reach you two times.  During this call, we will ask if you have developed any symptoms of COVID 19. If you develop any symptoms (ie: fever, flu-like symptoms, shortness of breath, cough etc.) before then, please call [phone].  If you test positive for Covid 19 in the 2 weeks post procedure, please call and report this information to [place].    If any biopsies were taken you will be contacted by phone or by letter within the next 1-3 weeks.  Please call us at [phone] if you have not heard about the biopsies in 3 weeks.    SIGNATURES/CONFIDENTIALITY: You and/or your care partner have signed paperwork which will be entered into your electronic medical record.  These signatures attest to the fact that that the information  above on your After Visit Summary has been reviewed and is understood.  Full responsibility of the confidentiality of this discharge information lies with you and/or your care-partner. 

## 2019-08-16 NOTE — Progress Notes (Signed)
PT taken to PACU. Monitors in place. VSS. Report given to RN. 

## 2019-08-16 NOTE — Progress Notes (Signed)
[facility] Temp-LC 

## 2019-08-16 NOTE — Op Note (Signed)
[place] Patient Name: Kathy Hodge Procedure Date: 08/16/2019 3:28 PM MRN: [id number] Endoscopist: Docia Chuck. Henrene Pastor , MD Age: 48 Referring MD:  Date of Birth: 05-Sep-1970 Gender: Female Account #: [id number] Procedure:                Colonoscopy with cold snare polypectomy x 2 Indications:              Screening for colorectal malignant neoplasm Medicines:                Monitored Anesthesia Care Procedure:                Pre-Anesthesia Assessment:                           - Prior to the procedure, a History and Physical                            was performed, and patient medications and                            allergies were reviewed. The patient's tolerance of                            previous anesthesia was also reviewed. The risks                            and benefits of the procedure and the sedation                            options and risks were discussed with the patient.                            All questions were answered, and informed consent                            was obtained. Prior Anticoagulants: The patient has                            taken no previous anticoagulant or antiplatelet                            agents. ASA Grade Assessment: II - A patient with                            mild systemic disease. After reviewing the risks                            and benefits, the patient was deemed in                            satisfactory condition to undergo the procedure.                           After obtaining informed consent, the colonoscope  was passed under direct vision. Throughout the                            procedure, the patient's blood pressure, pulse, and                            oxygen saturations were monitored continuously. The                            Colonoscope was introduced through the anus and                            advanced to the the cecum, identified by      appendiceal orifice and ileocecal valve. The                            ileocecal valve, appendiceal orifice, and rectum                            were photographed. The quality of the bowel                            preparation was excellent. The colonoscopy was                            performed without difficulty. The patient tolerated                            the procedure well. The bowel preparation used was                            SUPREP via split dose instruction. Scope In: 3:34:50 PM Scope Out: 3:51:22 PM Scope Withdrawal Time: 0 hours 13 minutes 3 seconds  Total Procedure Duration: 0 hours 16 minutes 32 seconds  Findings:                 Three polyps were found in the descending colon and                            ascending colon. The polyps were 2 to 4 mm in size.                            These polyps were removed with a cold snare.                            Resection and retrieval were complete.                           The exam was otherwise without abnormality on                            direct and retroflexion views. Complications:            No immediate complications. Estimated blood loss:  None. Estimated Blood Loss:     Estimated blood loss: none. Impression:               - Three 2 to 4 mm polyps in the descending colon                            and in the ascending colon, removed with a cold                            snare. Resected and retrieved.                           - The examination was otherwise normal on direct                            and retroflexion views. Recommendation:           - Repeat colonoscopy in 5 years for surveillance.                           - Patient has a contact number available for                            emergencies. The signs and symptoms of potential                            delayed complications were discussed with the                            patient. Return to normal  activities tomorrow.                            Written discharge instructions were provided to the                            patient.                           - Resume previous diet.                           - Continue present medications.                           - Await pathology results. Docia Chuck. Henrene Pastor, MD 08/16/2019 3:55:09 PM This report has been signed electronically.

## 2019-08-16 NOTE — Progress Notes (Signed)
Internal Medicine Clinic Attending  Case discussed with Dr. Helberg at the time of the visit.  We reviewed the resident's history and exam and pertinent patient test results.  I agree with the assessment, diagnosis, and plan of care documented in the resident's note.    

## 2019-08-18 ENCOUNTER — Telehealth: Payer: Self-pay

## 2019-08-18 NOTE — Telephone Encounter (Signed)
  Follow up Call-  Call back number 08/16/2019  Post procedure Call Back phone  # [phone]  Permission to leave phone message Yes  Some recent data might be hidden     Patient questions:  Do you have a fever, pain , or abdominal swelling? No. Pain Score  0 *  Have you tolerated food without any problems? Yes.    Have you been able to return to your normal activities? Yes.    Do you have any questions about your discharge instructions: Diet   No. Medications  No. Follow up visit  No.  Do you have questions or concerns about your Care? No.  Actions: * If pain score is 4 or above: No action needed, pain <4.  1. Have you developed a fever since your procedure? no  2.   Have you had an respiratory symptoms (SOB or cough) since your procedure? no  3.   Have you tested positive for COVID 19 since your procedure no  4.   Have you had any family members/close contacts diagnosed with the COVID 19 since your procedure?  no   If yes to any of these questions please route to Joylene John, RN and Alphonsa Gin, Therapist, sports.

## 2019-08-19 ENCOUNTER — Encounter: Payer: Self-pay | Admitting: Internal Medicine
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
## 2019-08-24 ENCOUNTER — Ambulatory Visit (INDEPENDENT_AMBULATORY_CARE_PROVIDER_SITE_OTHER): Payer: 59 | Admitting: Family Medicine

## 2019-08-24 ENCOUNTER — Other Ambulatory Visit: Payer: Self-pay

## 2019-08-24 ENCOUNTER — Encounter: Payer: Self-pay | Admitting: Family Medicine

## 2019-08-24 VITALS — BP 130/80 | HR 59 | Temp 97.3°F | Ht 69.0 in | Wt 228.4 lb

## 2019-08-24 DIAGNOSIS — G8929 Other chronic pain: Secondary | ICD-10-CM

## 2019-08-24 DIAGNOSIS — G43109 Migraine with aura, not intractable, without status migrainosus: Secondary | ICD-10-CM | POA: Diagnosis not present

## 2019-08-24 DIAGNOSIS — G4730 Sleep apnea, unspecified: Secondary | ICD-10-CM

## 2019-08-24 NOTE — Patient Instructions (Signed)
Continue topiramate 100mg  twice daily  Continue Nurtec for abortive therapy  Please work on nightly use of CPAP for at least 4 hours every night   Follow up in 6 months, sooner if needed   Migraine Headache A migraine headache is a very strong throbbing pain on one side or both sides of your head. This type of headache can also cause other symptoms. It can last from 4 hours to 3 days. Talk with your doctor about what things may bring on (trigger) this condition. What are the causes? The exact cause of this condition is not known. This condition may be triggered or caused by:  Drinking alcohol.  Smoking.  Taking medicines, such as: ? Medicine used to treat chest pain (nitroglycerin). ? Birth control pills. ? Estrogen. ? Some blood pressure medicines.  Eating or drinking certain products.  Doing physical activity. Other things that may trigger a migraine headache include:  Having a menstrual period.  Pregnancy.  Hunger.  Stress.  Not getting enough sleep or getting too much sleep.  Weather changes.  Tiredness (fatigue). What increases the risk?  Being 48 years old.  Being female.  Having a family history of migraine headaches.  Being Caucasian.  Having depression or anxiety.  Being very overweight. What are the signs or symptoms?  A throbbing pain. This pain may: ? Happen in any area of the head, such as on one side or both sides. ? Make it hard to do daily activities. ? Get worse with physical activity. ? Get worse around bright lights or loud noises.  Other symptoms may include: ? Feeling sick to your stomach (nauseous). ? Vomiting. ? Dizziness. ? Being sensitive to bright lights, loud noises, or smells.  Before you get a migraine headache, you may get warning signs (an aura). An aura may include: ? Seeing flashing lights or having blind spots. ? Seeing bright spots, halos, or zigzag lines. ? Having tunnel vision or blurred vision. ? Having  numbness or a tingling feeling. ? Having trouble talking. ? Having weak muscles.  Some people have symptoms after a migraine headache (postdromal phase), such as: ? Tiredness. ? Trouble thinking (concentrating). How is this treated?  Taking medicines that: ? Relieve pain. ? Relieve the feeling of being sick to your stomach. ? Prevent migraine headaches.  Treatment may also include: ? Having acupuncture. ? Avoiding foods that bring on migraine headaches. ? Learning ways to control your body functions (biofeedback). ? Therapy to help you know and deal with negative thoughts (cognitive behavioral therapy). Follow these instructions at home: Medicines  Take over-the-counter and prescription medicines only as told by your doctor.  Ask your doctor if the medicine prescribed to you: ? Requires you to avoid driving or using heavy machinery. ? Can cause trouble pooping (constipation). You may need to take these steps to prevent or treat trouble pooping:  Drink enough fluid to keep your pee (urine) pale yellow.  Take over-the-counter or prescription medicines.  Eat foods that are high in fiber. These include beans, whole grains, and fresh fruits and vegetables.  Limit foods that are high in fat and sugar. These include fried or sweet foods. Lifestyle  Do not drink alcohol.  Do not use any products that contain nicotine or tobacco, such as cigarettes, e-cigarettes, and chewing tobacco. If you need help quitting, ask your doctor.  Get at least 8 hours of sleep every night.  Limit and deal with stress. General instructions      Keep a  journal to find out what may bring on your migraine headaches. For example, write down: ? What you eat and drink. ? How much sleep you get. ? Any change in what you eat or drink. ? Any change in your medicines.  If you have a migraine headache: ? Avoid things that make your symptoms worse, such as bright lights. ? It may help to lie down in a  dark, quiet room. ? Do not drive or use heavy machinery. ? Ask your doctor what activities are safe for you.  Keep all follow-up visits as told by your doctor. This is important. Contact a doctor if:  You get a migraine headache that is different or worse than others you have had.  You have more than 15 headache days in one month. Get help right away if:  Your migraine headache gets very bad.  Your migraine headache lasts longer than 72 hours.  You have a fever.  You have a stiff neck.  You have trouble seeing.  Your muscles feel weak or like you cannot control them.  You start to lose your balance a lot.  You start to have trouble walking.  You pass out (faint).  You have a seizure. Summary  A migraine headache is a very strong throbbing pain on one side or both sides of your head. These headaches can also cause other symptoms.  This condition may be treated with medicines and changes to your lifestyle.  Keep a journal to find out what may bring on your migraine headaches.  Contact a doctor if you get a migraine headache that is different or worse than others you have had.  Contact your doctor if you have more than 15 headache days in a month. This information is not intended to replace advice given to you by your health care provider. Make sure you discuss any questions you have with your health care provider. Document Released: 05/28/2008 Document Revised: 12/11/2018 Document Reviewed: 10/01/2018 Elsevier Patient Education  [place].    Sleep Apnea Sleep apnea affects breathing during sleep. It causes breathing to stop for a short time or to become shallow. It can also increase the risk of:  Heart attack.  Stroke.  Being very overweight (obese).  Diabetes.  Heart failure.  Irregular heartbeat. The goal of treatment is to help you breathe normally again. What are the causes? There are three kinds of sleep apnea:  Obstructive sleep apnea.  This is caused by a blocked or collapsed airway.  Central sleep apnea. This happens when the brain does not send the right signals to the muscles that control breathing.  Mixed sleep apnea. This is a combination of obstructive and central sleep apnea. The most common cause of this condition is a collapsed or blocked airway. This can happen if:  Your throat muscles are too relaxed.  Your tongue and tonsils are too large.  You are overweight.  Your airway is too small. What increases the risk?  Being overweight.  Smoking.  Having a small airway.  Being older.  Being female.  Drinking alcohol.  Taking medicines to calm yourself (sedatives or tranquilizers).  Having family members with the condition. What are the signs or symptoms?  Trouble staying asleep.  Being sleepy or tired during the day.  Getting angry a lot.  Loud snoring.  Headaches in the morning.  Not being able to focus your mind (concentrate).  Forgetting things.  Less interest in sex.  Mood swings.  Personality changes.  Feelings of sadness (depression).  Waking up a lot during the night to pee (urinate).  Dry mouth.  Sore throat. How is this diagnosed?  Your medical history.  A physical exam.  A test that is done when you are sleeping (sleep study). The test is most often done in a sleep lab but may also be done at home. How is this treated?   Sleeping on your side.  Using a medicine to get rid of mucus in your nose (decongestant).  Avoiding the use of alcohol, medicines to help you relax, or certain pain medicines (narcotics).  Losing weight, if needed.  Changing your diet.  Not smoking.  Using a machine to open your airway while you sleep, such as: ? An oral appliance. This is a mouthpiece that shifts your lower jaw forward. ? A CPAP device. This device blows air through a mask when you breathe out (exhale). ? An EPAP device. This has valves that you put in each nostril.  ? A BPAP device. This device blows air through a mask when you breathe in (inhale) and breathe out.  Having surgery if other treatments do not work. It is important to get treatment for sleep apnea. Without treatment, it can lead to:  High blood pressure.  Coronary artery disease.  In men, not being able to have an erection (impotence).  Reduced thinking ability. Follow these instructions at home: Lifestyle  Make changes that your doctor recommends.  Eat a healthy diet.  Lose weight if needed.  Avoid alcohol, medicines to help you relax, and some pain medicines.  Do not use any products that contain nicotine or tobacco, such as cigarettes, e-cigarettes, and chewing tobacco. If you need help quitting, ask your doctor. General instructions  Take over-the-counter and prescription medicines only as told by your doctor.  If you were given a machine to use while you sleep, use it only as told by your doctor.  If you are having surgery, make sure to tell your doctor you have sleep apnea. You may need to bring your device with you.  Keep all follow-up visits as told by your doctor. This is important. Contact a doctor if:  The machine that you were given to use during sleep bothers you or does not seem to be working.  You do not get better.  You get worse. Get help right away if:  Your chest hurts.  You have trouble breathing in enough air.  You have an uncomfortable feeling in your back, arms, or stomach.  You have trouble talking.  One side of your body feels weak.  A part of your face is hanging down. These symptoms may be an emergency. Do not wait to see if the symptoms will go away. Get medical help right away. Call your local emergency services (911 in the [place] Do not drive yourself to the hospital. Summary  This condition affects breathing during sleep.  The most common cause is a collapsed or blocked airway.  The goal of treatment is to help you breathe  normally while you sleep. This information is not intended to replace advice given to you by your health care provider. Make sure you discuss any questions you have with your health care provider. Document Released: 05/28/2008 Document Revised: 06/05/2018 Document Reviewed: 04/14/2018 Elsevier Patient Education  [place].    CPAP and BPAP Information CPAP and BPAP are methods of helping a person breathe with the use of air pressure. CPAP stands for "continuous positive airway  pressure." BPAP stands for "bi-level positive airway pressure." In both methods, air is blown through your nose or mouth and into your air passages to help you breathe well. CPAP and BPAP use different amounts of pressure to blow air. With CPAP, the amount of pressure stays the same while you breathe in and out. With BPAP, the amount of pressure is increased when you breathe in (inhale) so that you can take larger breaths. Your health care provider will recommend whether CPAP or BPAP would be more helpful for you. Why are CPAP and BPAP treatments used? CPAP or BPAP can be helpful if you have:  Sleep apnea.  Chronic obstructive pulmonary disease (COPD).  Heart failure.  Medical conditions that weaken the muscles of the chest including muscular dystrophy, or neurological diseases such as amyotrophic lateral sclerosis (ALS).  Other problems that cause breathing to be weak, abnormal, or difficult. CPAP is most commonly used for obstructive sleep apnea (OSA) to keep the airways from collapsing when the muscles relax during sleep. How is CPAP or BPAP administered? Both CPAP and BPAP are provided by a small machine with a flexible plastic tube that attaches to a plastic mask. You wear the mask. Air is blown through the mask into your nose or mouth. The amount of pressure that is used to blow the air can be adjusted on the machine. Your health care provider will determine the pressure setting that should be used based  on your individual needs. When should CPAP or BPAP be used? In most cases, the mask only needs to be worn during sleep. Generally, the mask needs to be worn throughout the night and during any daytime naps. People with certain medical conditions may also need to wear the mask at other times when they are awake. Follow instructions from your health care provider about when to use the machine. What are some tips for using the mask?   Because the mask needs to be snug, some people feel trapped or closed-in (claustrophobic) when first using the mask. If you feel this way, you may need to get used to the mask. One way to do this is by holding the mask loosely over your nose or mouth and then gradually applying the mask more snugly. You can also gradually increase the amount of time that you use the mask.  Masks are available in various types and sizes. Some fit over your mouth and nose while others fit over just your nose. If your mask does not fit well, talk with your health care provider about getting a different one.  If you are using a mask that fits over your nose and you tend to breathe through your mouth, a chin strap may be applied to help keep your mouth closed.  The CPAP and BPAP machines have alarms that may sound if the mask comes off or develops a leak.  If you have trouble with the mask, it is very important that you talk with your health care provider about finding a way to make the mask easier to tolerate. Do not stop using the mask. Stopping the use of the mask could have a negative impact on your health. What are some tips for using the machine?  Place your CPAP or BPAP machine on a secure table or stand near an electrical outlet.  Know where the on/off switch is located on the machine.  Follow instructions from your health care provider about how to set the pressure on your machine and when you  should use it.  Do not eat or drink while the CPAP or BPAP machine is on. Food or  fluids could get pushed into your lungs by the pressure of the CPAP or BPAP.  Do not smoke. Tobacco smoke residue can damage the machine.  For home use, CPAP and BPAP machines can be rented or purchased through home health care companies. Many different brands of machines are available. Renting a machine before purchasing may help you find out which particular machine works well for you.  Keep the CPAP or BPAP machine and attachments clean. Ask your health care provider for specific instructions. Get help right away if:  You have redness or open areas around your nose or mouth where the mask fits.  You have trouble using the CPAP or BPAP machine.  You cannot tolerate wearing the CPAP or BPAP mask.  You have pain, discomfort, and bloating in your abdomen. Summary  CPAP and BPAP are methods of helping a person breathe with the use of air pressure.  Both CPAP and BPAP are provided by a small machine with a flexible plastic tube that attaches to a plastic mask.  If you have trouble with the mask, it is very important that you talk with your health care provider about finding a way to make the mask easier to tolerate. This information is not intended to replace advice given to you by your health care provider. Make sure you discuss any questions you have with your health care provider. Document Released: 05/17/2004 Document Revised: 12/09/2018 Document Reviewed: 07/08/2016 Elsevier Patient Education  2020 [employer].
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
## 2019-09-02 IMAGING — DX CHEST - 2 VIEW
2 series · 2 of 2 positions shown · non-contrast
Comparison: None.

CLINICAL DATA: Chest pain

EXAM:
CHEST - 2 VIEW

[chest pa]
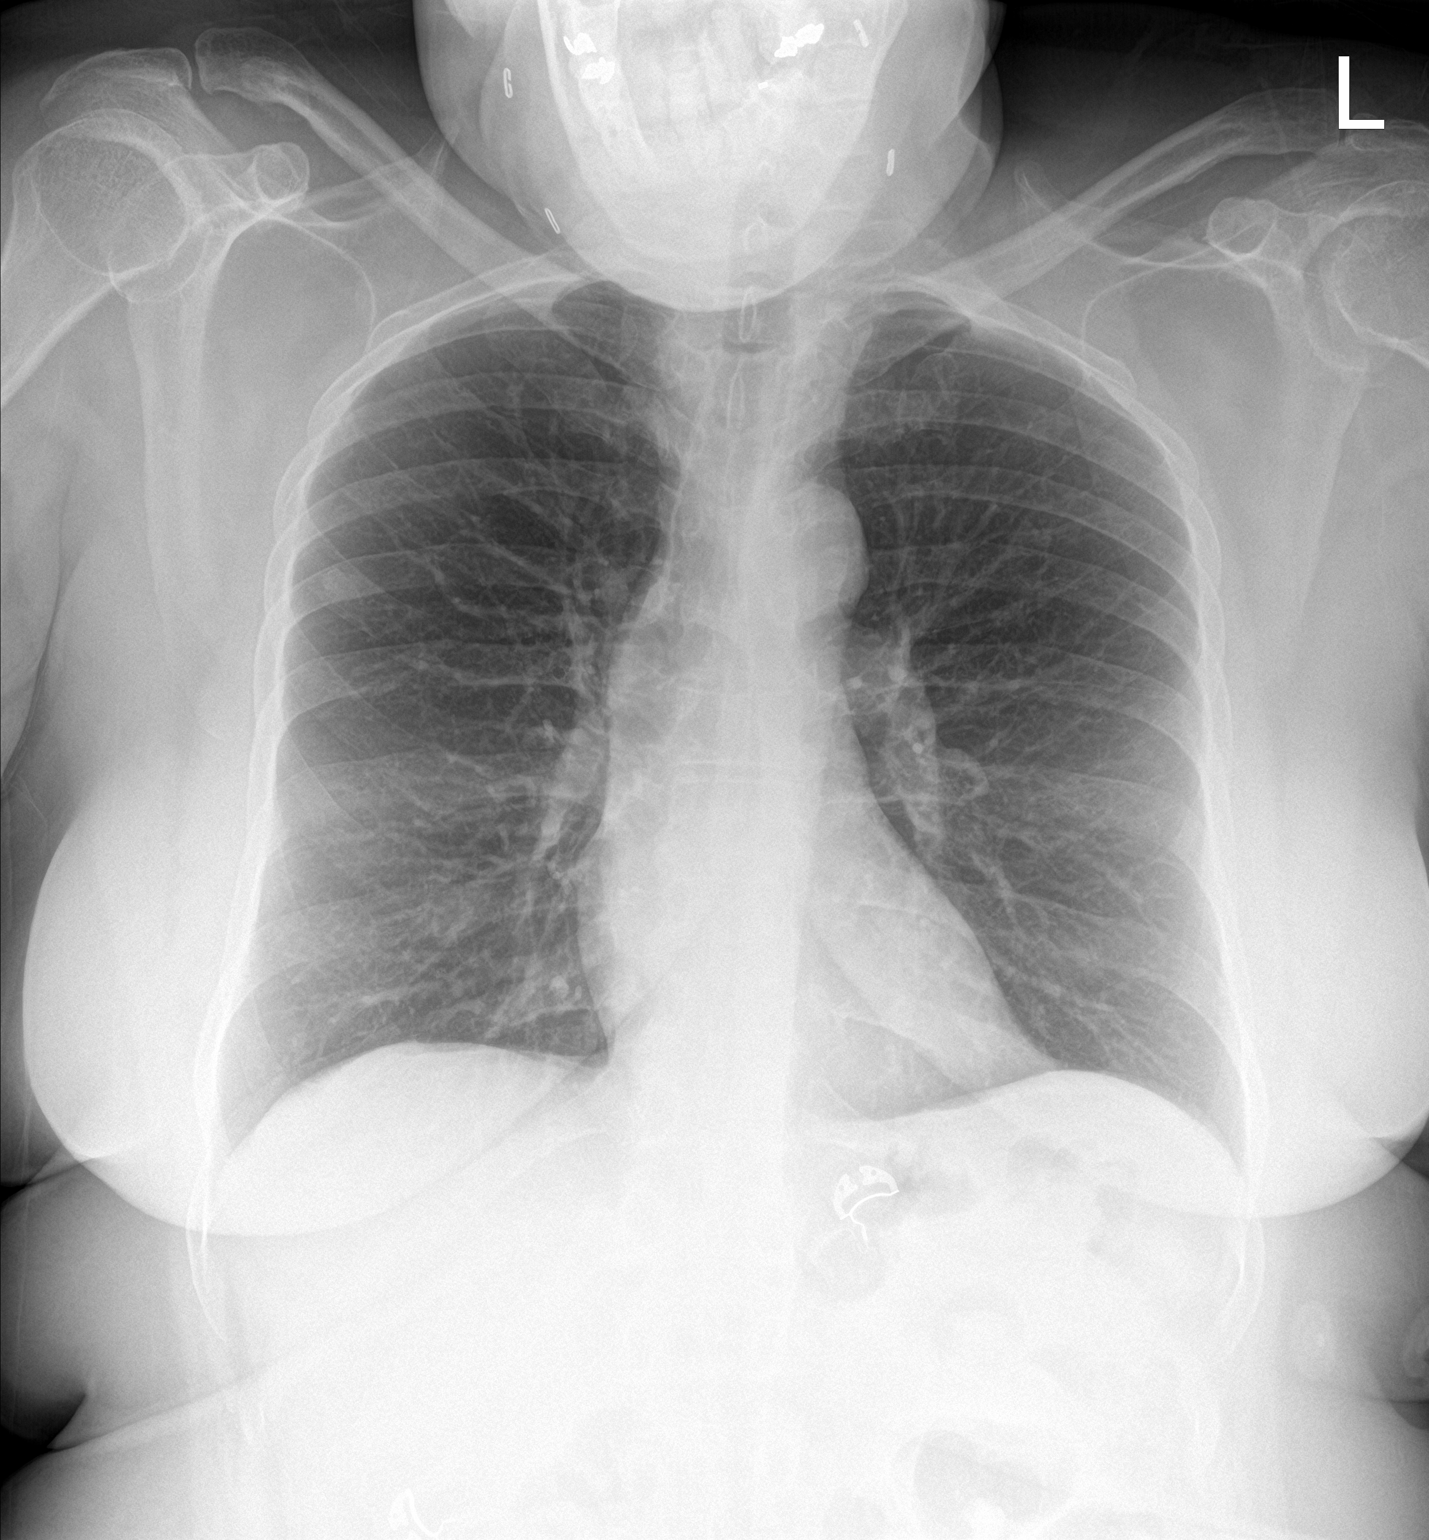

[chest lat]
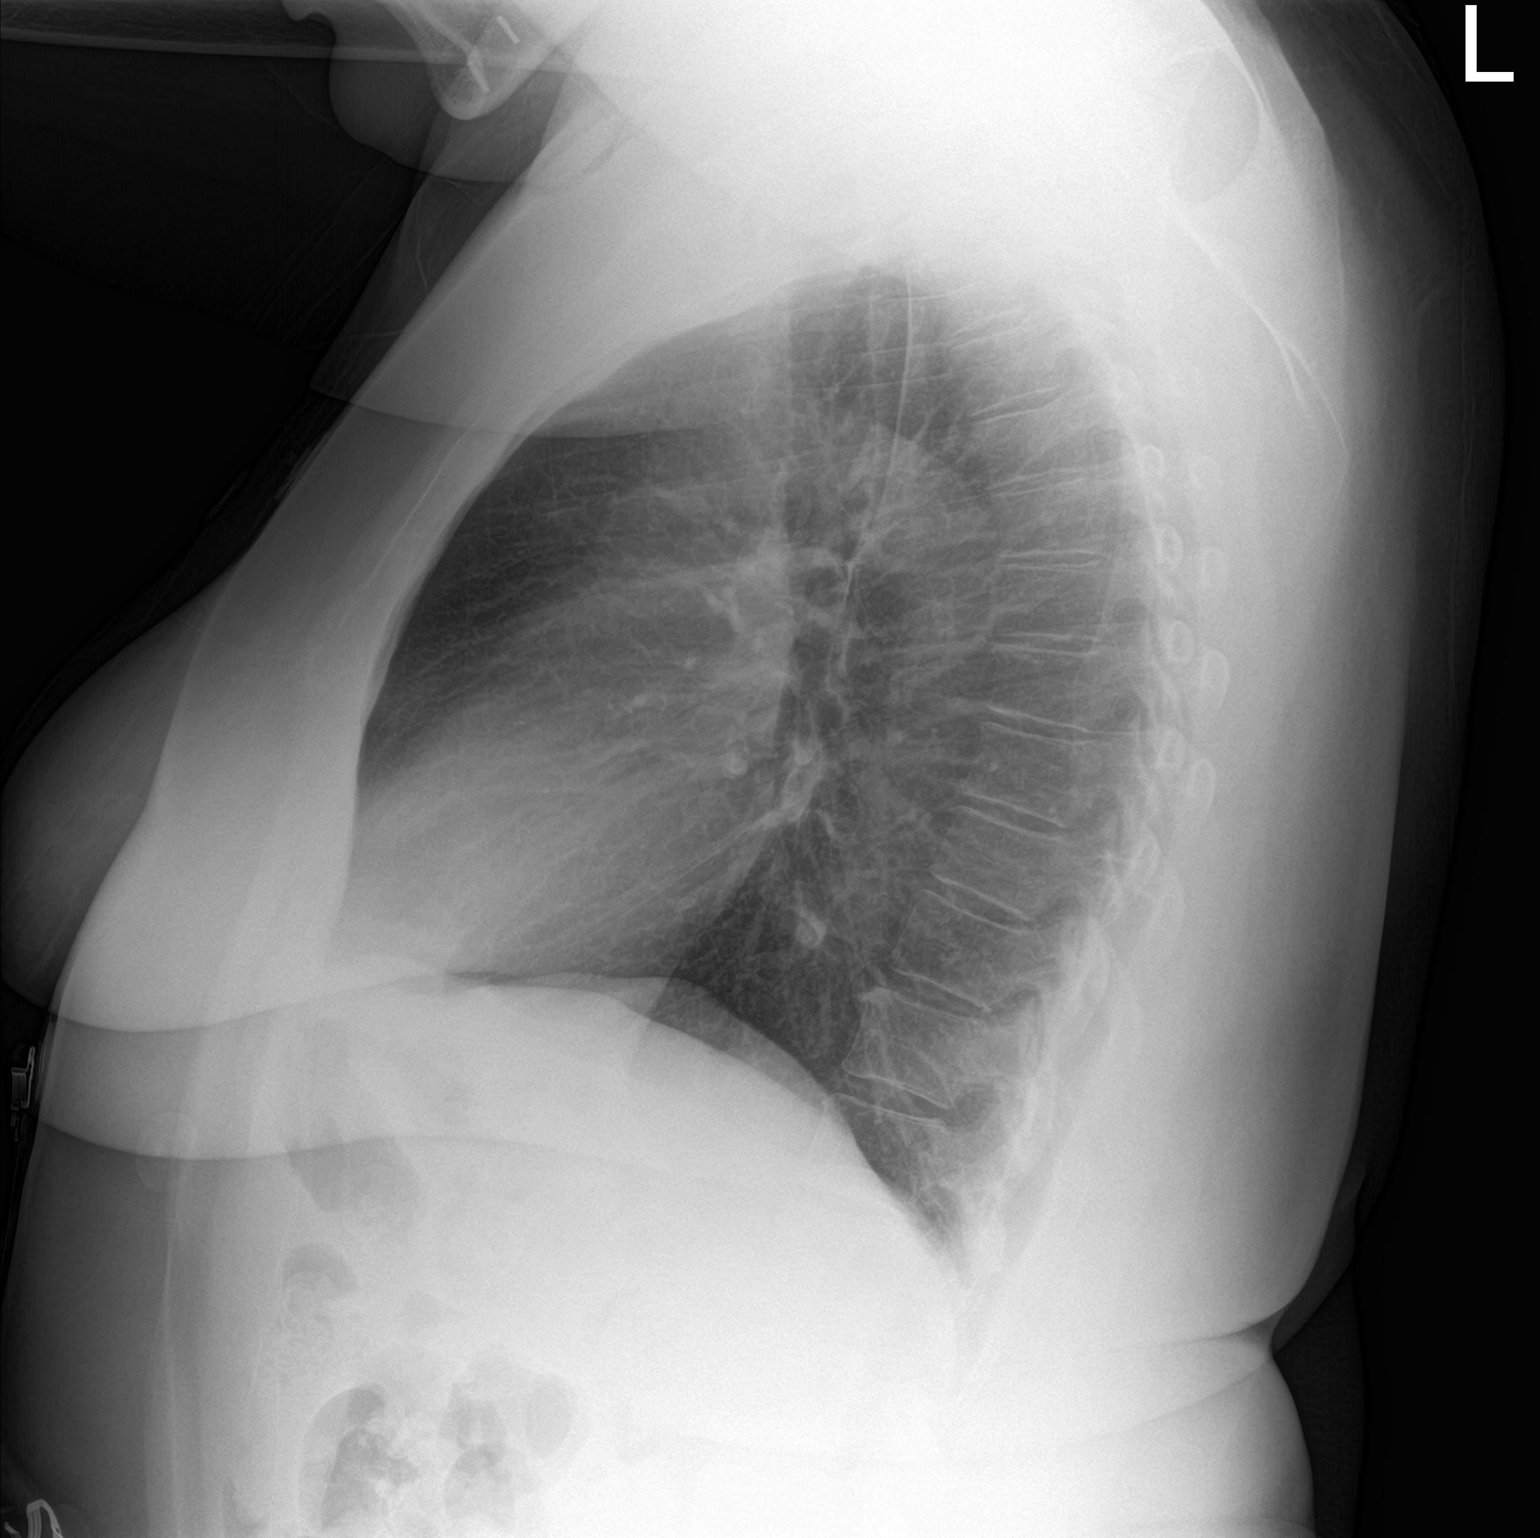

[2 of 2 positions shown; findings below may reference images not displayed]

FINDINGS: There is no edema or consolidation. The heart size and pulmonary
vascularity are normal. No adenopathy. No pneumothorax. No bone
lesions.
IMPRESSION: No edema or consolidation.

## 2019-09-05 ENCOUNTER — Other Ambulatory Visit: Payer: Self-pay | Admitting: Internal Medicine

## 2019-09-07 ENCOUNTER — Other Ambulatory Visit: Payer: Self-pay

## 2019-09-07 ENCOUNTER — Encounter (HOSPITAL_BASED_OUTPATIENT_CLINIC_OR_DEPARTMENT_OTHER): Payer: Self-pay | Admitting: Orthopedic Surgery

## 2019-09-07 NOTE — Progress Notes (Addendum)
Spoke w/ via phone for pre-op interview---Kathy Hodge needs dos----  I stat 8   (history of hypertension, patient quit taking hypertension medication on own, patient states blood pressure running normal when she checked it last a few weeks ago)            Hodge results------ ekg 08-05-2019 chart/epic, chest xray  04-07-2019 care everywhere, lov nerology amy lomax np 08-24-2019  COVID test ------+ covid test 07-08-2019 results in epic, patient stated having sore throat fever of 101.0 x 2 days, and headache, no hospitalization needed,  all symptoms resolved within few days, quarantine ended July 17, 2019.  Arrive at -------1215 pm, 09-13-2019 NPO after ------midnight food, clear liquids until 815 am then npo Medications to take morning of surgery -----albuterol inhaler prn/bring inhaler, dulera inhaler, topamax, oxycodone prn, patient not currently using cpap, has new mask ordered Diabetic medication -----n/a Patient Special Instructions ----- Pre-Op special Istructions ----- Patient verbalized understanding of instructions that were given at this phone interview. Patient denies shortness of breath, chest pain, fever, cough a this phone interview.

## 2019-09-09 ENCOUNTER — Ambulatory Visit: Payer: 59 | Attending: Internal Medicine

## 2019-09-09 DIAGNOSIS — Z20822 Contact with and (suspected) exposure to covid-19: Secondary | ICD-10-CM

## 2019-09-11 LAB — NOVEL CORONAVIRUS, NAA: SARS-CoV-2, NAA: NOT DETECTED

## 2019-09-13 ENCOUNTER — Other Ambulatory Visit: Payer: Self-pay

## 2019-09-13 ENCOUNTER — Ambulatory Visit (HOSPITAL_BASED_OUTPATIENT_CLINIC_OR_DEPARTMENT_OTHER): Payer: 59 | Admitting: Anesthesiology

## 2019-09-13 ENCOUNTER — Ambulatory Visit (HOSPITAL_BASED_OUTPATIENT_CLINIC_OR_DEPARTMENT_OTHER)
Admission: RE | Admit: 2019-09-13 | Discharge: 2019-09-13 | Disposition: A | Discharge status: Home / Self Care | Payer: 59 | Attending: Orthopedic Surgery | Admitting: Orthopedic Surgery

## 2019-09-13 ENCOUNTER — Encounter (HOSPITAL_BASED_OUTPATIENT_CLINIC_OR_DEPARTMENT_OTHER): Payer: Self-pay | Admitting: Orthopedic Surgery

## 2019-09-13 ENCOUNTER — Encounter (HOSPITAL_BASED_OUTPATIENT_CLINIC_OR_DEPARTMENT_OTHER)
Admission: RE | Disposition: A | Discharge status: Home / Self Care | Payer: Self-pay | Source: Home / Self Care | Attending: Orthopedic Surgery

## 2019-09-13 DIAGNOSIS — S43432A Superior glenoid labrum lesion of left shoulder, initial encounter: Secondary | ICD-10-CM | POA: Diagnosis present

## 2019-09-13 DIAGNOSIS — J45909 Unspecified asthma, uncomplicated: Secondary | ICD-10-CM | POA: Diagnosis not present

## 2019-09-13 DIAGNOSIS — Z79899 Other long term (current) drug therapy: Secondary | ICD-10-CM | POA: Insufficient documentation

## 2019-09-13 DIAGNOSIS — Z8616 Personal history of COVID-19: Secondary | ICD-10-CM | POA: Diagnosis not present

## 2019-09-13 DIAGNOSIS — Z87892 Personal history of anaphylaxis: Secondary | ICD-10-CM | POA: Insufficient documentation

## 2019-09-13 DIAGNOSIS — X58XXXA Exposure to other specified factors, initial encounter: Secondary | ICD-10-CM | POA: Insufficient documentation

## 2019-09-13 DIAGNOSIS — G4730 Sleep apnea, unspecified: Secondary | ICD-10-CM | POA: Diagnosis not present

## 2019-09-13 DIAGNOSIS — M25712 Osteophyte, left shoulder: Secondary | ICD-10-CM | POA: Diagnosis not present

## 2019-09-13 DIAGNOSIS — M94212 Chondromalacia, left shoulder: Secondary | ICD-10-CM | POA: Insufficient documentation

## 2019-09-13 DIAGNOSIS — G43909 Migraine, unspecified, not intractable, without status migrainosus: Secondary | ICD-10-CM | POA: Insufficient documentation

## 2019-09-13 DIAGNOSIS — M24012 Loose body in left shoulder: Secondary | ICD-10-CM | POA: Diagnosis not present

## 2019-09-13 DIAGNOSIS — Z7951 Long term (current) use of inhaled steroids: Secondary | ICD-10-CM | POA: Diagnosis not present

## 2019-09-13 DIAGNOSIS — I10 Essential (primary) hypertension: Secondary | ICD-10-CM | POA: Diagnosis not present

## 2019-09-13 DIAGNOSIS — M19012 Primary osteoarthritis, left shoulder: Secondary | ICD-10-CM | POA: Insufficient documentation

## 2019-09-13 DIAGNOSIS — M7502 Adhesive capsulitis of left shoulder: Secondary | ICD-10-CM | POA: Insufficient documentation

## 2019-09-13 DIAGNOSIS — Z91010 Allergy to peanuts: Secondary | ICD-10-CM | POA: Insufficient documentation

## 2019-09-13 DIAGNOSIS — Z87891 Personal history of nicotine dependence: Secondary | ICD-10-CM | POA: Insufficient documentation

## 2019-09-13 HISTORY — PX: SHOULDER ARTHROSCOPY WITH ROTATOR CUFF REPAIR: SHX5685

## 2019-09-13 LAB — POCT I-STAT, CHEM 8
BUN: 15 mg/dL (ref 6–20)
Calcium, Ion: 1.31 mmol/L (ref 1.15–1.40)
Chloride: 112 mmol/L — ABNORMAL HIGH (ref 98–111)
Creatinine, Ser: 0.8 mg/dL (ref 0.44–1.00)
Glucose, Bld: 87 mg/dL (ref 70–99)
HCT: 39 % (ref 36.0–46.0)
Hemoglobin: 13.3 g/dL (ref 12.0–15.0)
Potassium: 3.7 mmol/L (ref 3.5–5.1)
Sodium: 144 mmol/L (ref 135–145)
TCO2: 20 mmol/L — ABNORMAL LOW (ref 22–32)

## 2019-09-13 SURGERY — ARTHROSCOPY, SHOULDER, WITH ROTATOR CUFF REPAIR
Anesthesia: General | Site: Shoulder | Laterality: Left

## 2019-09-13 MED ORDER — SODIUM CHLORIDE 0.9 % IR SOLN
Status: DC | PRN
  Administered 2019-09-13 (×3): 3000 mL

## 2019-09-13 MED ORDER — SCOPOLAMINE 1 MG/3DAYS TD PT72
MEDICATED_PATCH | TRANSDERMAL | Status: DC | PRN
  Administered 2019-09-13: 1 via TRANSDERMAL

## 2019-09-13 MED ORDER — EPINEPHRINE PF 1 MG/ML IJ SOLN
INTRAMUSCULAR | Status: DC | PRN
  Administered 2019-09-13: .15 mL

## 2019-09-13 MED ORDER — FENTANYL CITRATE (PF) 100 MCG/2ML IJ SOLN
INTRAMUSCULAR | Status: DC | PRN
  Administered 2019-09-13: 50 ug via INTRAVENOUS
  Administered 2019-09-13: 100 ug via INTRAVENOUS
  Administered 2019-09-13: 50 ug via INTRAVENOUS

## 2019-09-13 MED ORDER — ONDANSETRON HCL 4 MG/2ML IJ SOLN
INTRAMUSCULAR | Status: DC | PRN
  Administered 2019-09-13: 4 mg via INTRAVENOUS

## 2019-09-13 MED ORDER — CHLORHEXIDINE GLUCONATE 4 % EX LIQD
60.0000 mL | Freq: Once | CUTANEOUS | Status: DC
  Filled 2019-09-13: qty 118

## 2019-09-13 MED ORDER — BUPIVACAINE HCL (PF) 0.25 % IJ SOLN
INTRAMUSCULAR | Status: DC | PRN
  Administered 2019-09-13: 30 mL

## 2019-09-13 MED ORDER — MIDAZOLAM HCL 2 MG/2ML IJ SOLN
INTRAMUSCULAR | Status: AC
  Filled 2019-09-13: qty 2

## 2019-09-13 MED ORDER — LIDOCAINE 2% (20 MG/ML) 5 ML SYRINGE
INTRAMUSCULAR | Status: AC
  Filled 2019-09-13: qty 5

## 2019-09-13 MED ORDER — ONDANSETRON HCL 4 MG/2ML IJ SOLN
INTRAMUSCULAR | Status: AC
  Filled 2019-09-13: qty 2

## 2019-09-13 MED ORDER — FENTANYL CITRATE (PF) 100 MCG/2ML IJ SOLN
25.0000 ug | INTRAMUSCULAR | Status: DC | PRN
  Administered 2019-09-13: 25 ug via INTRAVENOUS
  Filled 2019-09-13: qty 1

## 2019-09-13 MED ORDER — SUCCINYLCHOLINE CHLORIDE 20 MG/ML IJ SOLN
INTRAMUSCULAR | Status: DC | PRN
  Administered 2019-09-13: 200 mg via INTRAVENOUS

## 2019-09-13 MED ORDER — CEFAZOLIN SODIUM-DEXTROSE 2-4 GM/100ML-% IV SOLN
2.0000 g | INTRAVENOUS | Status: AC
  Administered 2019-09-13: 14:00:00 2 g via INTRAVENOUS
  Filled 2019-09-13: qty 100

## 2019-09-13 MED ORDER — HYDROMORPHONE HCL 2 MG PO TABS: 2.0000 mg | ORAL_TABLET | ORAL | 0 refills | Status: AC | PRN

## 2019-09-13 MED ORDER — DEXAMETHASONE SODIUM PHOSPHATE 4 MG/ML IJ SOLN
INTRAMUSCULAR | Status: DC | PRN
  Administered 2019-09-13: 10 mg via INTRAVENOUS

## 2019-09-13 MED ORDER — MIDAZOLAM HCL 2 MG/2ML IJ SOLN
2.0000 mg | Freq: Once | INTRAMUSCULAR | Status: AC
  Administered 2019-09-13: 13:00:00 2 mg via INTRAVENOUS
  Filled 2019-09-13: qty 2

## 2019-09-13 MED ORDER — DEXAMETHASONE SODIUM PHOSPHATE 10 MG/ML IJ SOLN
INTRAMUSCULAR | Status: AC
  Filled 2019-09-13: qty 1

## 2019-09-13 MED ORDER — FENTANYL CITRATE (PF) 100 MCG/2ML IJ SOLN
50.0000 ug | Freq: Once | INTRAMUSCULAR | Status: AC
  Administered 2019-09-13: 50 ug via INTRAVENOUS
  Filled 2019-09-13: qty 1

## 2019-09-13 MED ORDER — SUGAMMADEX SODIUM 200 MG/2ML IV SOLN
INTRAVENOUS | Status: DC | PRN
  Administered 2019-09-13: 200 mg via INTRAVENOUS

## 2019-09-13 MED ORDER — PROPOFOL 10 MG/ML IV BOLUS
INTRAVENOUS | Status: DC | PRN
  Administered 2019-09-13 (×2): 50 mg via INTRAVENOUS
  Administered 2019-09-13: 150 mg via INTRAVENOUS
  Administered 2019-09-13: 50 mg via INTRAVENOUS

## 2019-09-13 MED ORDER — PROPOFOL 10 MG/ML IV BOLUS
INTRAVENOUS | Status: AC
  Filled 2019-09-13: qty 20

## 2019-09-13 MED ORDER — FENTANYL CITRATE (PF) 100 MCG/2ML IJ SOLN
INTRAMUSCULAR | Status: AC
  Filled 2019-09-13: qty 2

## 2019-09-13 MED ORDER — CEFAZOLIN SODIUM-DEXTROSE 2-4 GM/100ML-% IV SOLN
INTRAVENOUS | Status: AC
  Filled 2019-09-13: qty 100

## 2019-09-13 MED ORDER — ROCURONIUM BROMIDE 100 MG/10ML IV SOLN
INTRAVENOUS | Status: DC | PRN
  Administered 2019-09-13: 10 mg via INTRAVENOUS
  Administered 2019-09-13: 50 mg via INTRAVENOUS

## 2019-09-13 MED ORDER — KETOROLAC TROMETHAMINE 30 MG/ML IJ SOLN
INTRAMUSCULAR | Status: DC | PRN
  Administered 2019-09-13: 30 mg via INTRAVENOUS

## 2019-09-13 MED ORDER — BUPIVACAINE HCL (PF) 0.5 % IJ SOLN
INTRAMUSCULAR | Status: DC | PRN
  Administered 2019-09-13: 15 mL via PERINEURAL

## 2019-09-13 MED ORDER — ACETAMINOPHEN 500 MG PO TABS
ORAL_TABLET | ORAL | Status: AC
  Filled 2019-09-13: qty 2

## 2019-09-13 MED ORDER — SUCCINYLCHOLINE CHLORIDE 200 MG/10ML IV SOSY
PREFILLED_SYRINGE | INTRAVENOUS | Status: AC
  Filled 2019-09-13: qty 10

## 2019-09-13 MED ORDER — OXYCODONE-ACETAMINOPHEN 10-325 MG PO TABS: 1.0000 | ORAL_TABLET | ORAL | 0 refills | Status: DC | PRN

## 2019-09-13 MED ORDER — LACTATED RINGERS IV SOLN
INTRAVENOUS | Status: DC
  Filled 2019-09-13 (×2): qty 1000

## 2019-09-13 MED ORDER — ONDANSETRON HCL 4 MG PO TABS: 4.0000 mg | ORAL_TABLET | Freq: Three times a day (TID) | ORAL | 1 refills | Status: DC | PRN

## 2019-09-13 MED ORDER — TIZANIDINE HCL 4 MG PO TABS: ORAL_TABLET | ORAL | 1 refills | Status: AC

## 2019-09-13 MED ORDER — SCOPOLAMINE 1 MG/3DAYS TD PT72
MEDICATED_PATCH | TRANSDERMAL | Status: AC
  Filled 2019-09-13: qty 1

## 2019-09-13 MED ORDER — BUPIVACAINE LIPOSOME 1.3 % IJ SUSP
INTRAMUSCULAR | Status: DC | PRN
  Administered 2019-09-13: 10 mL via PERINEURAL

## 2019-09-13 MED ORDER — ACETAMINOPHEN 500 MG PO TABS
1000.0000 mg | ORAL_TABLET | Freq: Once | ORAL | Status: AC
  Administered 2019-09-13: 1000 mg via ORAL
  Filled 2019-09-13: qty 2

## 2019-09-13 MED ORDER — MIDAZOLAM HCL 5 MG/5ML IJ SOLN
INTRAMUSCULAR | Status: DC | PRN
  Administered 2019-09-13: 2 mg via INTRAVENOUS

## 2019-09-13 MED ORDER — KETOROLAC TROMETHAMINE 30 MG/ML IJ SOLN
INTRAMUSCULAR | Status: AC
  Filled 2019-09-13: qty 1

## 2019-09-13 MED ORDER — ROCURONIUM BROMIDE 10 MG/ML (PF) SYRINGE
PREFILLED_SYRINGE | INTRAVENOUS | Status: AC
  Filled 2019-09-13: qty 10

## 2019-09-13 SURGICAL SUPPLY — 75 items
ANCHOR ALL-SUT FLEX 1.3 Y-KNOT (Anchor) ×3 IMPLANT
BIT DRILL 1.3M DISPOSABLE (BIT) ×3 IMPLANT
BLADE 15 SAFETY STRL DISP (BLADE) ×3 IMPLANT
BLADE AVERAGE 25MMX9MM (BLADE) ×1
BLADE AVERAGE 25X9 (BLADE) ×2 IMPLANT
BLADE EXCALIBUR 4.0MM X 13CM (MISCELLANEOUS)
BLADE EXCALIBUR 4.0X13 (MISCELLANEOUS) IMPLANT
BLADE LONG MED 31MMX9MM (MISCELLANEOUS)
BLADE LONG MED 31X9 (MISCELLANEOUS) IMPLANT
BLADE MICRO SAGITTAL (BLADE) IMPLANT
BLADE SURG 11 STRL SS (BLADE) ×3 IMPLANT
BLADE W/14.0X25.5MM (BLADE) ×3 IMPLANT
BURR OVAL 8 FLU 4.0MM X 13CM (MISCELLANEOUS) ×1
BURR OVAL 8 FLU 4.0X13 (MISCELLANEOUS) ×2 IMPLANT
CLOSURE WOUND 1/2 X4 (GAUZE/BANDAGES/DRESSINGS) ×2
COVER WAND RF STERILE (DRAPES) ×3 IMPLANT
CUTTER BONE 4.0MM X 13CM (MISCELLANEOUS) ×3 IMPLANT
DISSECTOR  3.8MM X 13CM (MISCELLANEOUS)
DISSECTOR 3.8MM X 13CM (MISCELLANEOUS) IMPLANT
DRAPE INCISE IOBAN 66X45 STRL (DRAPES) ×3 IMPLANT
DRAPE ORTHO SPLIT 77X108 STRL (DRAPES) ×4
DRAPE SHEET LG 3/4 BI-LAMINATE (DRAPES) ×3 IMPLANT
DRAPE STERI 35X30 U-POUCH (DRAPES) ×3 IMPLANT
DRAPE SURG ORHT 6 SPLT 77X108 (DRAPES) ×2 IMPLANT
DRAPE U-SHAPE 47X51 STRL (DRAPES) ×3 IMPLANT
DRSG EMULSION OIL 3X3 NADH (GAUZE/BANDAGES/DRESSINGS) ×3 IMPLANT
DRSG PAD ABDOMINAL 8X10 ST (GAUZE/BANDAGES/DRESSINGS) ×3 IMPLANT
DURAPREP 26ML APPLICATOR (WOUND CARE) ×3 IMPLANT
ELECT REM PT RETURN 9FT ADLT (ELECTROSURGICAL) ×3
ELECTRODE REM PT RTRN 9FT ADLT (ELECTROSURGICAL) ×1 IMPLANT
GAUZE SPONGE 4X4 12PLY STRL (GAUZE/BANDAGES/DRESSINGS) ×3 IMPLANT
GAUZE SPONGE 4X4 12PLY STRL LF (GAUZE/BANDAGES/DRESSINGS) ×3 IMPLANT
GLOVE BIOGEL PI IND STRL 7.5 (GLOVE) ×1 IMPLANT
GLOVE BIOGEL PI IND STRL 8.5 (GLOVE) ×1 IMPLANT
GLOVE BIOGEL PI INDICATOR 7.5 (GLOVE) ×2
GLOVE BIOGEL PI INDICATOR 8.5 (GLOVE) ×2
GLOVE BIOGEL PI ORTHO PRO 7.5 (GLOVE) ×2
GLOVE BIOGEL PI ORTHO PRO SZ8 (GLOVE) ×2
GLOVE PI ORTHO PRO STRL 7.5 (GLOVE) ×1 IMPLANT
GLOVE PI ORTHO PRO STRL SZ8 (GLOVE) ×1 IMPLANT
GOWN STRL REUS W/TWL XL LVL3 (GOWN DISPOSABLE) ×12 IMPLANT
IV NS IRRIG 3000ML ARTHROMATIC (IV SOLUTION) ×12 IMPLANT
KIT TURNOVER CYSTO (KITS) ×3 IMPLANT
MANIFOLD NEPTUNE II (INSTRUMENTS) ×3 IMPLANT
NEEDLE MAYO 6 CRC TAPER PT (NEEDLE) ×6 IMPLANT
NEEDLE SPNL 18GX3.5 QUINCKE PK (NEEDLE) ×3 IMPLANT
NS IRRIG 1000ML POUR BTL (IV SOLUTION) ×3 IMPLANT
PACK BASIN DAY SURGERY FS (CUSTOM PROCEDURE TRAY) ×3 IMPLANT
PACK SHOULDER (CUSTOM PROCEDURE TRAY) ×3 IMPLANT
PAD ABD 8X10 STRL (GAUZE/BANDAGES/DRESSINGS) ×3 IMPLANT
PORT APPOLLO RF 90DEGREE MULTI (SURGICAL WAND) ×3 IMPLANT
RESTRAINT HEAD UNIVERSAL NS (MISCELLANEOUS) ×3 IMPLANT
SLING ARM FOAM STRAP LRG (SOFTGOODS) ×3 IMPLANT
SLING ARM FOAM STRAP MED (SOFTGOODS) IMPLANT
SPONGE LAP 4X18 RFD (DISPOSABLE) IMPLANT
STRIP CLOSURE SKIN 1/2X4 (GAUZE/BANDAGES/DRESSINGS) ×4 IMPLANT
SUCTION FRAZIER HANDLE 10FR (MISCELLANEOUS) ×2
SUCTION TUBE FRAZIER 10FR DISP (MISCELLANEOUS) ×1 IMPLANT
SUT BONE WAX W31G (SUTURE) ×3 IMPLANT
SUT HI-FI 2 STRAND C-2 40 (SUTURE) ×3 IMPLANT
SUT MNCRL AB 3-0 PS2 18 (SUTURE) ×3 IMPLANT
SUT MNCRL AB 4-0 PS2 18 (SUTURE) IMPLANT
SUT VIC AB 0 CT1 27 (SUTURE) ×2
SUT VIC AB 0 CT1 27XBRD ANBCTR (SUTURE) ×1 IMPLANT
SUT VIC AB 0 CT2 27 (SUTURE) ×3 IMPLANT
SUT VIC AB 2-0 CT1 27 (SUTURE) ×2
SUT VIC AB 2-0 CT1 TAPERPNT 27 (SUTURE) ×1 IMPLANT
SYR BULB IRRIGATION 50ML (SYRINGE) ×3 IMPLANT
TAPE CLOTH SURG 6X10 WHT LF (GAUZE/BANDAGES/DRESSINGS) ×3 IMPLANT
TOWEL OR 17X26 10 PK STRL BLUE (TOWEL DISPOSABLE) ×6 IMPLANT
TUBE CONNECTING 12'X1/4 (SUCTIONS) ×1
TUBE CONNECTING 12X1/4 (SUCTIONS) ×2 IMPLANT
TUBING ARTHROSCOPY IRRIG 16FT (MISCELLANEOUS) ×3 IMPLANT
WATER STERILE IRR 1000ML POUR (IV SOLUTION) IMPLANT
YANKAUER SUCT BULB TIP NO VENT (SUCTIONS) ×3 IMPLANT

## 2019-09-13 NOTE — Discharge Instructions (Signed)
Ice to the shoulder constantly.  Keep the incision covered and clean and dry for one week, then ok to get it wet in the shower.  Do exercise as instructed several times per day. Start later today if possible -   Pendulums - dangle arm in circles with gravity and your body motion helping Gentle lap slides - hand on lap, slide out to knee and then back to the hip Rotation with arm hugging elbow and elbow bent at 90 degrees, move hand in to your stomach and then rotate away from you back and forth like a swinging gate  DO NOT reach behind your back or push up out of a chair with the operative arm.  Use a sling while you are up and around for comfort, may remove while seated.  Keep pillow propped behind the operative elbow.  Follow up with Dr Veverly Fells in two weeks in the office, call [phone] for appt  Post Anesthesia Home Care Instructions  Activity: Get plenty of rest for the remainder of the day. A responsible individual must stay with you for 24 hours following the procedure.  For the next 24 hours, DO NOT: -Drive a car -Paediatric nurse -Drink alcoholic beverages -Take any medication unless instructed by your physician -Make any legal decisions or sign important papers.  Meals: Start with liquid foods such as gelatin or soup. Progress to regular foods as tolerated. Avoid greasy, spicy, heavy foods. If nausea and/or vomiting occur, drink only clear liquids until the nausea and/or vomiting subsides. Call your physician if vomiting continues.  Special Instructions/Symptoms: Your throat may feel dry or sore from the anesthesia or the breathing tube placed in your throat during surgery. If this causes discomfort, gargle with warm salt water. The discomfort should disappear within 24 hours.  If you had a scopolamine patch placed behind your ear for the management of post- operative nausea and/or vomiting:  1. The medication in the patch is effective for 72 hours, after which it should  be removed.  Wrap patch in a tissue and discard in the trash. Wash hands thoroughly with soap and water. 2. You may remove the patch earlier than 72 hours if you experience unpleasant side effects which may include dry mouth, dizziness or visual disturbances. 3. Avoid touching the patch. Wash your hands with soap and water after contact with the patch.  Information for Discharge Teaching: EXPAREL (bupivacaine liposome injectable suspension)   Your surgeon or anesthesiologist gave you EXPAREL(bupivacaine) to help control your pain after surgery.   EXPAREL is a local anesthetic that provides pain relief by numbing the tissue around the surgical site.  EXPAREL is designed to release pain medication over time and can control pain for up to 72 hours.  Depending on how you respond to EXPAREL, you may require less pain medication during your recovery.  Possible side effects:  Temporary loss of sensation or ability to move in the area where bupivacaine was injected.  Nausea, vomiting, constipation  Rarely, numbness and tingling in your mouth or lips, lightheadedness, or anxiety may occur.  Call your doctor right away if you think you may be experiencing any of these sensations, or if you have other questions regarding possible side effects.  Follow all other discharge instructions given to you by your surgeon or nurse. Eat a healthy diet and drink plenty of water or other fluids.  If you return to the hospital for any reason within 96 hours following the administration of EXPAREL, it is important  for health care providers to know that you have received this anesthetic. A teal colored band has been placed on your arm with the date, time and amount of EXPAREL you have received in order to alert and inform your health care providers. Please leave this armband in place for the full 96 hours following administration, and then you may remove the band.  Regional Anesthesia Blocks  1. Numbness or the  inability to move the "blocked" extremity may last from 3-48 hours after placement. The length of time depends on the medication injected and your individual response to the medication. If the numbness is not going away after 48 hours, call your surgeon.  2. The extremity that is blocked will need to be protected until the numbness is gone and the  Strength has returned. Because you cannot feel it, you will need to take extra care to avoid injury. Because it may be weak, you may have difficulty moving it or using it. You may not know what position it is in without looking at it while the block is in effect.  3. For blocks in the legs and feet, returning to weight bearing and walking needs to be done carefully. You will need to wait until the numbness is entirely gone and the strength has returned. You should be able to move your leg and foot normally before you try and bear weight or walk. You will need someone to be with you when you first try to ensure you do not fall and possibly risk injury.  4. Bruising and tenderness at the needle site are common side effects and will resolve in a few days.  5. Persistent numbness or new problems with movement should be communicated to the surgeon or the [place] [phone] [place] [phone]).

## 2019-09-13 NOTE — Brief Op Note (Signed)
09/13/2019  4:14 PM  PATIENT:  Kathy Hodge  49 y.o. female  PRE-OPERATIVE DIAGNOSIS:  Left shoulder cuff tear, SLAP tear, AC joint DJD  POST-OPERATIVE DIAGNOSIS:  Left shoulder frozen shoulder, SLAP tear, AC DJD, glenohumeral OA  PROCEDURE:  Left shoulder EUA, MUA, scope with debridement, loose body removal, capsular release, A-SAD, open biceps tenodesis, open DCR  SURGEON:  Surgeon(s) and Role:    Netta Cedars, MD - Primary  PHYSICIAN ASSISTANT:   ASSISTANTS: Ventura Bruns, PA-C   ANESTHESIA:   regional and general  EBL:  None    BLOOD ADMINISTERED:none  DRAINS: none   LOCAL MEDICATIONS USED:  MARCAINE     SPECIMEN:  No Specimen  DISPOSITION OF SPECIMEN:  N/A  COUNTS:  YES  TOURNIQUET:  * No tourniquets in log *  DICTATION: .Other Dictation: Dictation Number [id number]  PLAN OF CARE: Discharge to home after PACU  PATIENT DISPOSITION:  PACU - hemodynamically stable.   Delay start of Pharmacological VTE agent (>24hrs) due to surgical blood loss or risk of bleeding: not applicable

## 2019-09-13 NOTE — Anesthesia Procedure Notes (Signed)
Procedure Name: Intubation Date/Time: 09/13/2019 2:26 PM Performed by: Justice Rocher, CRNA Pre-anesthesia Checklist: Patient identified, Emergency Drugs available, Suction available and Patient being monitored Patient Re-evaluated:Patient Re-evaluated prior to induction Oxygen Delivery Method: Circle system utilized Preoxygenation: Pre-oxygenation with 100% oxygen Induction Type: IV induction Ventilation: Mask ventilation without difficulty Laryngoscope Size: Mac and 3 Grade View: Grade II Tube type: Oral Tube size: 7.5 mm Number of attempts: 1 Airway Equipment and Method: Stylet and Oral airway Placement Confirmation: ETT inserted through vocal cords under direct vision,  positive ETCO2 and breath sounds checked- equal and bilateral Secured at: 23 cm Tube secured with: Tape Dental Injury: Teeth and Oropharynx as per pre-operative assessment

## 2019-09-13 NOTE — Interval H&P Note (Signed)
History and Physical Interval Note:  09/13/2019 2:03 PM  Kathy Hodge  has presented today for surgery, with the diagnosis of Left shoulder cuff tear.  The various methods of treatment have been discussed with the patient and family. After consideration of risks, benefits and other options for treatment, the patient has consented to  Procedure(s) with comments: SHOULDER ARTHROSCOPY WITH ROTATOR CUFF REPAIR open distal clavicle resection subacromial decompression and biceps tenodesis (Left) - with interscalene block as a surgical intervention.  The patient's history has been reviewed, patient examined, no change in status, stable for surgery.  I have reviewed the patient's chart and labs.  Questions were answered to the patient's satisfaction.     Augustin Schooling

## 2019-09-13 NOTE — Progress Notes (Signed)
Assisted Dr. Lanetta Inch with left, ultrasound guided, interscalene  block. Side rails up, monitors on throughout procedure. See vital signs in flow sheet. Tolerated Procedure well.

## 2019-09-13 NOTE — Anesthesia Procedure Notes (Signed)
Anesthesia Regional Block: Interscalene brachial plexus block   Pre-Anesthetic Checklist: ,, timeout performed, Correct Patient, Correct Site, Correct Laterality, Correct Procedure, Correct Position, site marked, Risks and benefits discussed,  Surgical consent,  Pre-op evaluation,  At surgeon's request and post-op pain management  Laterality: Left  Prep: Maximum Sterile Barrier Precautions used, chloraprep       Needles:  Injection technique: Single-shot  Needle Type: Echogenic Stimulator Needle     Needle Length: 4cm  Needle Gauge: 22     Additional Needles:   Procedures:,,,, ultrasound used (permanent image in chart),,,,  Narrative:  Start time: 09/13/2019 1:00 PM End time: 09/13/2019 1:10 PM Injection made incrementally with aspirations every 5 mL.  Performed by: Personally  Anesthesiologist: Freddrick March, MD  Additional Notes: Monitors applied. No increased pain on injection. No increased resistance to injection. Injection made in 5cc increments. Good needle visualization. Patient tolerated procedure well.

## 2019-09-13 NOTE — Op Note (Signed)
NAME: Kathy Hodge, Kathy Hodge MEDICAL RECORD [id number] ACCOUNT [id number] DATE OF BIRTH:November 24, 1970 FACILITY: [facility] LOCATION: [facility] PHYSICIAN:STEVEN Orlena Sheldon, MD  OPERATIVE REPORT  DATE OF PROCEDURE:  09/13/2019  PREOPERATIVE DIAGNOSES:   1.  Left shoulder superior labrum anterior posterior tear.  2.  Possible rotator cuff tear.  3.  Acromioclavicular joint arthritis.  POSTOPERATIVE DIAGNOSES: 1.  Left shoulder frozen shoulder. 2.  Left shoulder superior labrum anterior posterior tear. 3.  Left shoulder acromioclavicular joint arthritis. 4.  Loose body and osteophytes with glenohumeral arthritis, left shoulder.  PROCEDURE PERFORMED:   1.  Exam under anesthesia.  2.  Manipulation under anesthesia.  3.  Shoulder arthroscopy.  4.  Capsular release. 5.  Removal of loose body.   6.  Minimal chondroplasty.  7.  Arthroscopic subacromial decompression with coracoacromial ligament release. 8.  Open biceps tenodesis in the groove. 9.  Open distal clavicle resection.  SURGEON:   Esmond Plants, MD  ASSISTANT:  Darol Destine, [place], who was scrubbed during the entire procedure and necessary for satisfactory completion of surgery.  ANESTHESIA:  General anesthesia was used plus interscalene block.  ESTIMATED BLOOD LOSS:  Minimal.  FLUID REPLACEMENT:  1000 mL crystalloid.  INSTRUMENT COUNTS:  Correct.  COMPLICATIONS:  No complications.  ANTIBIOTICS:  Perioperative antibiotics were given.  INDICATIONS:  The patient is a 49 year old female who presents with worsening left shoulder pain and stiffness and dysfunction secondary to suspected rotator cuff tear, SLAP tear and AC arthritis.  The patient has failed conservative management, desires  operative treatment to restore function and eliminate pain.  Informed consent obtained.  DESCRIPTION OF PROCEDURE:  After an adequate level of anesthesia was achieved, the patient was positioned in a modified beach chair position.  Left  shoulder correctly identified and a timeout was called, verifying correct patient, correct side.  I did an  exam under anesthesia revealing significant stiffness of the shoulder consistent with a frozen shoulder.  I performed a progressive manipulation under anesthesia, first in the flexion plane revealing 2 good pops as the shoulder motion was restored.  We  then did some cross body adduction, internal rotation and then some controlled external rotation, bringing the arm down from overhead careful not to sublux the humerus anteriorly.  Once we had completed our exam under anesthesia, we prepped and draped  the shoulder and arm.  We verified our correct patient, correct site, timeout.  We then entered the shoulder using standard portals including anterior, posterior and lateral portals.  We identified significant bleeding in the shoulder consistent with  capsular disruption.  Superior labrum was torn.  Biceps anchor was compromised.  We performed a biceps tenotomy and labral debridement back to a stable labral rim.  Subscap was intact.  Interestingly, it appeared there had been an avulsion of the  anteroinferior portion of the glenoid.  There was a spur present at the anteroinferior glenoid and that served as the start point for the glenoid avulsion.  This was less than 10% of the anterior portion of the glenoid and was not felt to need repairing.   We went ahead and removed that bone fragment.  The capsular release was completed inferior to that and also using the Arthrocare unit and started from the posterior portal down inferiorly connecting at the inferior portion,  careful to protect the  axillary nerve.  We freed up the subscap so it glided smoothly.  The supraspinatus, infraspinatus and teres minor from the surface looked normal.  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  Those were  removed using a tangential chondroplasty technique.  I did not  see any full thickness wear on the glenoid side even though we had seen some cysts on the MRI.  Once we completed our intraarticular portion, including capsular release, spur removal and  loose body removal and a biceps tenotomy and labral debridement we placed the scope in the subacromial space.  A thorough bursectomy was performed, followed by an acromioplasty, creating a type 1 acromial shaped with a butcher block technique.  With a  high-speed bur, we did release the CA ligament.  Rotator cuff was visually normal from the bursal surface as well.  We concluded the arthroscopic portion of the procedure.  We made a small saber incision overlying the AC joint.  Dissection down through  subcutaneous tissues using needle tip Bovie, identified the deltotrapezial fascia and incised in line with distal clavicle.  Subperiosteal dissection of distal clavicle performed, followed by excision of distal 2-3 mm of bone using an oscillating saw.   We irrigated thoroughly.  We applied bone wax to the cut end of the clavicle.  We made sure anterior and posterior AC ligaments were intact.  We removed dorsal hypertrophied capsule off the acromion with a rongeur.  We then repaired the deltotrapezial  fascia anatomically with 0 Vicryl suture, followed by 2-0 Vicryl for subcutaneous closure and 4-0 Monocryl for skin.  We addressed the biceps and were able to palpate and visualize the rotator cuff through a mini open incision starting at the  anterolateral border of the acromion, extending distally about 4 cm in the raphae between the anterior and lateral heads of the deltoid.  Dissection down through subcutaneous tissues  after incision with a 10 blade scalpel.  We used a needle tip Bovie to  dissect down through to the fat stripe in the raphae, then marked the raphae between the anterior and lateral heads of the deltoid.  We  developed that with a needle tip Bovie  initially and the Mayo scissors below that, getting down to the  subdeltoid  plane.  We placed Arthrex retractor, identified the biceps groove.  Delivered the biceps tendon out of the bicipital sheath, whipstitched with  #2 Hi-Fi suture to reinforce.  We then prepared the floor of the biceps groove with needle tip Bovie and a  Soil scientist, getting down to bleeding bone.  We placed a Y-Knot flex anchor through the floor of the biceps groove and brought that suture up in a mattress fashion through the reinforced portion of the tendon, tying that down flush to the tunnel.  We  took the longitudinal whipstitch sutures up through the rotator interval and sutured that in a mattress fashion, incorporating part of the subscap tendon, so we had a nice double fixed biceps tenodesis, low profile.  I was able to visualize and palpate  the rotator cuff in its entirety from the biceps groove posteriorly.  I did not detect any significant disruption of the rotator cuff tendon.  The thickness felt consistent anterior to posterior.  There were no divots or soft areas.  The tendon looked  100% healthy from the dorsal side and basically what we were seeing on MRI was perhaps some very subtle interstitial tearing, but nothing worth disrupting her smooth rotator cuff bursal surface.  There was no impinging lesions.  We irrigated thoroughly.   We had a nice decompression.  We then repaired the deltoid anatomically with  0 Vicryl suture, followed by 2-0 Vicryl for subcutaneous closure and 4-0 Monocryl for skin and portals.  Steri-Strips applied, followed by sterile dressing.  The patient  tolerated surgery well.  VN/NUANCE  D:09/13/2019 T:09/13/2019 JOB:[id number]

## 2019-09-13 NOTE — Transfer of Care (Signed)
Immediate Anesthesia Transfer of Care Note  Patient: Kathy Hodge  Procedure(s) Performed: Procedure(s) (LRB): SHOULDER ARTHROSCOPY WITH ROTATOR CUFF REPAIR open distal clavicle resection subacromial decompression and biceps tenodesis (Left)  Patient Location: PACU  Anesthesia Type: General  Level of Consciousness: awake, sedated, patient cooperative and responds to stimulation  Airway & Oxygen Therapy: Patient Spontanous Breathing and Patient connected to NC02 and soft FM   Post-op Assessment: Report given to PACU RN, Post -op Vital signs reviewed and stable and Patient moving all extremities  Post vital signs: Reviewed and stable  Complications: No apparent anesthesia complications

## 2019-09-13 NOTE — Anesthesia Postprocedure Evaluation (Signed)
Anesthesia Post Note  Patient: Kathy Hodge  Procedure(s) Performed: SHOULDER ARTHROSCOPY WITH ROTATOR CUFF REPAIR open distal clavicle resection subacromial decompression and biceps tenodesis (Left Shoulder)     Patient location during evaluation: PACU Anesthesia Type: General Level of consciousness: awake and alert and oriented Pain management: pain level controlled Vital Signs Assessment: post-procedure vital signs reviewed and stable Respiratory status: spontaneous breathing, nonlabored ventilation and respiratory function stable Cardiovascular status: blood pressure returned to baseline and stable Postop Assessment: no apparent nausea or vomiting Anesthetic complications: no    Last Vitals:  Vitals:   09/13/19 1630 09/13/19 1645  BP: 133/83 (!) 150/84  Pulse: 78 73  Resp: 18 19  Temp: 37 C   SpO2: 100% 97%    Last Pain:  Vitals:   09/13/19 1653  TempSrc:   PainSc: 2                  Mada Sadik A.

## 2019-09-13 NOTE — Anesthesia Preprocedure Evaluation (Addendum)
Anesthesia Evaluation  Patient identified by MRN, date of birth, ID band Patient awake    Reviewed: Allergy & Precautions, NPO status , Patient's Chart, lab work & pertinent test results  Airway Mallampati: I  TM Distance: >3 FB Neck ROM: Full    Dental no notable dental hx. (+) Teeth Intact, Dental Advisory Given   Pulmonary asthma , sleep apnea , former smoker,  COVID positive 07/08/2019 now negative and asymptomatic   Pulmonary exam normal breath sounds clear to auscultation       Cardiovascular hypertension, Pt. on medications negative cardio ROS Normal cardiovascular exam Rhythm:Regular Rate:Normal     Neuro/Psych  Headaches, PSYCHIATRIC DISORDERS Anxiety Depression    GI/Hepatic negative GI ROS, Neg liver ROS,   Endo/Other  negative endocrine ROS  Renal/GU negative Renal ROS  negative genitourinary   Musculoskeletal negative musculoskeletal ROS (+)   Abdominal   Peds  Hematology negative hematology ROS (+)   Anesthesia Other Findings   Reproductive/Obstetrics                            Anesthesia Physical Anesthesia Plan  ASA: III  Anesthesia Plan: General and Regional   Post-op Pain Management:  Regional for Post-op pain   Induction: Intravenous  PONV Risk Score and Plan: 3 and Midazolam, Dexamethasone and Ondansetron  Airway Management Planned: Oral ETT  Additional Equipment:   Intra-op Plan:   Post-operative Plan: Extubation in OR  Informed Consent: I have reviewed the patients History and Physical, chart, labs and discussed the procedure including the risks, benefits and alternatives for the proposed anesthesia with the patient or authorized representative who has indicated his/her understanding and acceptance.     Dental advisory given  Plan Discussed with: CRNA  Anesthesia Plan Comments:        Anesthesia Quick Evaluation

## 2019-09-14 ENCOUNTER — Encounter (INDEPENDENT_AMBULATORY_CARE_PROVIDER_SITE_OTHER): Payer: Self-pay

## 2019-09-15 ENCOUNTER — Encounter (INDEPENDENT_AMBULATORY_CARE_PROVIDER_SITE_OTHER): Payer: Self-pay

## 2019-09-15 ENCOUNTER — Emergency Department (HOSPITAL_BASED_OUTPATIENT_CLINIC_OR_DEPARTMENT_OTHER): Payer: 59

## 2019-09-15 ENCOUNTER — Emergency Department (HOSPITAL_BASED_OUTPATIENT_CLINIC_OR_DEPARTMENT_OTHER)
Admission: EM | Admit: 2019-09-15 | Discharge: 2019-09-15 | Disposition: A | Discharge status: Home / Self Care | Payer: 59 | Attending: Emergency Medicine | Admitting: Emergency Medicine

## 2019-09-15 ENCOUNTER — Other Ambulatory Visit: Payer: Self-pay

## 2019-09-15 ENCOUNTER — Encounter (HOSPITAL_BASED_OUTPATIENT_CLINIC_OR_DEPARTMENT_OTHER): Payer: Self-pay

## 2019-09-15 DIAGNOSIS — J45909 Unspecified asthma, uncomplicated: Secondary | ICD-10-CM | POA: Diagnosis not present

## 2019-09-15 DIAGNOSIS — I10 Essential (primary) hypertension: Secondary | ICD-10-CM | POA: Insufficient documentation

## 2019-09-15 DIAGNOSIS — Z87891 Personal history of nicotine dependence: Secondary | ICD-10-CM | POA: Insufficient documentation

## 2019-09-15 DIAGNOSIS — M79604 Pain in right leg: Secondary | ICD-10-CM | POA: Insufficient documentation

## 2019-09-15 DIAGNOSIS — Z91010 Allergy to peanuts: Secondary | ICD-10-CM | POA: Diagnosis not present

## 2019-09-15 DIAGNOSIS — Z79899 Other long term (current) drug therapy: Secondary | ICD-10-CM | POA: Insufficient documentation

## 2019-09-15 MED ORDER — PROMETHAZINE HCL 25 MG PO TABS
25.0000 mg | ORAL_TABLET | Freq: Once | ORAL | Status: AC
  Administered 2019-09-15: 23:00:00 25 mg via ORAL
  Filled 2019-09-15: qty 1
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
## 2019-09-15 NOTE — Discharge Instructions (Signed)
Ultrasound did not show any evidence of blood clot in her leg.  This may be musculoskeletal.  You can continue take Tylenol or ibuprofen as related pain medication help with your symptoms.  Continue taking Zofran for nausea.  Follow-up with your surgeon as directed.  Return the emergency department for any worsening pain, chest pain, difficulty breathing, vomiting or any other worsening or concerning symptoms.

## 2019-09-15 NOTE — ED Triage Notes (Signed)
Pt c/o right calf pain, nausea x today-states she had left shoulder surgery 2 days ago and was advised to come to ED-NAD-to triage NAD with LE in sling

## 2019-09-17 ENCOUNTER — Encounter (INDEPENDENT_AMBULATORY_CARE_PROVIDER_SITE_OTHER): Payer: Self-pay

## 2019-09-17 ENCOUNTER — Other Ambulatory Visit: Payer: Self-pay

## 2019-09-17 ENCOUNTER — Other Ambulatory Visit (HOSPITAL_COMMUNITY): Payer: Self-pay | Admitting: Physician Assistant

## 2019-09-17 ENCOUNTER — Ambulatory Visit (HOSPITAL_COMMUNITY)
Admission: RE | Admit: 2019-09-17 | Discharge: 2019-09-17 | Disposition: A | Discharge status: Home / Self Care | Payer: 59 | Source: Ambulatory Visit | Attending: Surgery | Admitting: Surgery

## 2019-09-17 DIAGNOSIS — R2232 Localized swelling, mass and lump, left upper limb: Secondary | ICD-10-CM

## 2019-09-18 ENCOUNTER — Encounter (INDEPENDENT_AMBULATORY_CARE_PROVIDER_SITE_OTHER): Payer: Self-pay

## 2019-09-19 ENCOUNTER — Encounter (INDEPENDENT_AMBULATORY_CARE_PROVIDER_SITE_OTHER): Payer: Self-pay

## 2019-09-20 ENCOUNTER — Encounter (INDEPENDENT_AMBULATORY_CARE_PROVIDER_SITE_OTHER): Payer: Self-pay

## 2019-09-20 ENCOUNTER — Encounter: Payer: Self-pay | Admitting: Allergy

## 2019-09-20 ENCOUNTER — Ambulatory Visit (INDEPENDENT_AMBULATORY_CARE_PROVIDER_SITE_OTHER): Payer: 59 | Admitting: Allergy

## 2019-09-20 ENCOUNTER — Other Ambulatory Visit: Payer: Self-pay

## 2019-09-20 VITALS — BP 140/84 | HR 57 | Temp 97.7°F | Resp 16

## 2019-09-20 DIAGNOSIS — T7800XD Anaphylactic reaction due to unspecified food, subsequent encounter: Secondary | ICD-10-CM

## 2019-09-20 DIAGNOSIS — J4540 Moderate persistent asthma, uncomplicated: Secondary | ICD-10-CM

## 2019-09-20 DIAGNOSIS — R12 Heartburn: Secondary | ICD-10-CM

## 2019-09-20 DIAGNOSIS — J302 Other seasonal allergic rhinitis: Secondary | ICD-10-CM

## 2019-09-20 DIAGNOSIS — J3089 Other allergic rhinitis: Secondary | ICD-10-CM

## 2019-09-20 MED ORDER — OMEPRAZOLE 40 MG PO CPDR: 40.0000 mg | DELAYED_RELEASE_CAPSULE | Freq: Every day | ORAL | 2 refills | Status: DC

## 2019-09-20 NOTE — Assessment & Plan Note (Signed)
Stopped PPI after her EGD in December. Now noticing some reflux issues. She is on pain medications for her recent surgery.  Take omeprazole 40mg  in the morning. Nothing to eat or drink for 30 minutes.  Once off pain medications and symptoms improve, wean off omeprazole and monitor symptoms.

## 2019-09-20 NOTE — Assessment & Plan Note (Signed)
Past history - Intermittent rhinitis symptoms for the past 20+ years mainly during seasonal changes. 2020 skin testing was negative to environmental allergy panel. Declined intradermal testing. 2020 immunocap positive to dog and borderline to tree pollen.  Interim history - stable with below regimen.   Continue environmental control measures.   May need to take the below allergy medications on a daily basis during the spring.  May use over the counter antihistamines such as Zyrtec (cetirizine), Claritin (loratadine), Allegra (fexofenadine), or Xyzal (levocetirizine) daily as needed.  May use Flonase 1 spray per nostril 1-2 times a day as needed for nasal congestion.

## 2019-09-20 NOTE — Assessment & Plan Note (Signed)
Past history - Diagnosed with asthma as a child.  Ex-smoker. 2020 spirometry was normal however it did show 18% improvement in FEV1 post bronchodilator treatment. Interim history - stable. ACT score 22. Used albuterol 3 times per month for exertion related issues. Had [place] in November and asthma did not flare.   Daily controller medication(s):Dulera 200 2 puffs twice a day with spacer and rinse mouth afterwards.   Continue Singulair 10mg  daily.  Prior to physical activity:May use albuterol rescue inhaler 2 puffs 5 to 15 minutes prior to strenuous physical activities.  Rescue medications:May use albuterol rescue inhaler 2 puffs or nebulizer every 4 to 6 hours as needed for shortness of breath, chest tightness, coughing, and wheezing. Monitor frequency of use  Get spirometry at next visit and if clinical doing well will consider stepping down on therapy.

## 2019-09-20 NOTE — Progress Notes (Signed)
Follow Up Note  RE: Kathy Hodge MRN: [id number] DOB: [birth year] Date of Office Visit: 09/20/2019  Referring provider: Jean Rosenthal, MD Primary care provider: Jeanmarie Hubert, MD  Chief Complaint: Follow-up  History of Present Illness: I had the pleasure of seeing Kathy Hodge for a follow up visit at the [facility] on 09/20/2019. She is a 49 y.o. female, who is being followed for asthma, allergic rhinitis, food allergies and heartburn. Her previous allergy office visit was on 06/16/2019 with Dr. Maudie Mercury. Today is a regular follow up visit.  Asthma: Patient had COVID-19 in November with diarrhea, slight fevers, lightheadedness and dizziness. Now feeling much better. Patient not sure where she got it from. No on else in her family had it. The infection did not flare her asthma.   Denies any SOB, coughing, wheezing, chest tightness, nocturnal awakenings, ER/urgent care visits or prednisone use since the last visit. Using albuterol 3 times per month mainly with exertion. Currently on Dulera 200 2 puffs BID and Singulair daily with good benefit.  Other allergic rhinitis No issues. Currently using Flonase prn and benadryl prn.  Anaphylactic shock due to adverse food reaction Avoiding tree nuts and sesame. No accidental ingestions.  Epipen up to date.  Heartburn Stopped omeprazole for a few weeks and noticed that her reflux is worse since then.  She had surgery on her left arm and doing physical therapy. Having some pain and taking pain medications for this.   Assessment and Plan: Kathy Hodge is a 49 y.o. female with: Asthma, well controlled, moderate persistent Past history - Diagnosed with asthma as a child.  Ex-smoker. 2020 spirometry was normal however it did show 18% improvement in FEV1 post bronchodilator treatment. Interim history - stable. ACT score 22. Used albuterol 3 times per month for exertion related issues. Had Kathy Hodge in November and asthma did  not flare.   Daily controller medication(s):Dulera 200 2 puffs twice a day with spacer and rinse mouth afterwards.   Continue Singulair 10mg  daily.  Prior to physical activity:May use albuterol rescue inhaler 2 puffs 5 to 15 minutes prior to strenuous physical activities.  Rescue medications:May use albuterol rescue inhaler 2 puffs or nebulizer every 4 to 6 hours as needed for shortness of breath, chest tightness, coughing, and wheezing. Monitor frequency of use  Get spirometry at next visit and if clinical doing well will consider stepping down on therapy.   Seasonal and perennial allergic rhinitis Past history - Intermittent rhinitis symptoms for the past 20+ years mainly during seasonal changes. 2020 skin testing was negative to environmental allergy panel. Declined intradermal testing. 2020 immunocap positive to dog and borderline to tree pollen.  Interim history - stable with below regimen.   Continue environmental control measures.   May need to take the below allergy medications on a daily basis during the spring.  May use over the counter antihistamines such as Zyrtec (cetirizine), Claritin (loratadine), Allegra (fexofenadine), or Xyzal (levocetirizine) daily as needed.  May use Flonase 1 spray per nostril 1-2 times a day as needed for nasal congestion.  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  Patient consumes limited sesame and finned fish.  2020 skin testing was positive to tree nuts, fish mix and sesame.  Negative to peanuts. 2020 bloodwork was positive to tree nuts, sesame.  Interim history - no reactions since last OV.  Continue strict avoidance of tree nuts, sesame.  For mild symptoms you can take over the counter antihistamines such as Benadryl and monitor symptoms closely. If symptoms worsen or if you have severe symptoms including breathing issues, throat closure, significant  swelling, whole body hives, severe diarrhea and vomiting, lightheadedness then inject epinephrine and seek immediate medical care afterwards.  Food action plan in place.   Heartburn Stopped PPI after her EGD in December. Now noticing some reflux issues. She is on pain medications for her recent surgery.  Take omeprazole 40mg  in the morning. Nothing to eat or drink for 30 minutes.  Once off pain medications and symptoms improve, wean off omeprazole and monitor symptoms.   Return in about 4 months (around 01/18/2020).  Meds ordered this encounter  Medications  . omeprazole (PRILOSEC) 40 MG capsule    Sig: Take 1 capsule (40 mg total) by mouth daily.    Dispense:  30 capsule    Refill:  2   Diagnostics: None.  Medication List:  Current Outpatient Medications  Medication Sig Dispense Refill  . albuterol (VENTOLIN HFA) 108 (90 Base) MCG/ACT inhaler INHALE 1 TO 2 PUFFS INTO THE LUNGS EVERY 6 HOURS AS NEEDED FOR WHEEZING OR SHORTNESS OF BREATH 8.5 g 0  . diphenhydrAMINE (BENADRYL) 25 MG tablet Take 1 tablet (25 mg total) by mouth every 6 (six) hours. (Patient taking differently: Take 25 mg by mouth every 6 (six) hours as needed. ) 20 tablet 0  . fluticasone (FLONASE) 50 MCG/ACT nasal spray Place 1 spray into both nostrils 2 (two) times daily as needed for allergies or rhinitis. 16 g 5  . metoCLOPramide (REGLAN) 10 MG tablet Take 1 tablet (10 mg total) by mouth every 6 (six) hours as needed for nausea (nausea/headache). Take with benadryl and ibuprofen 15 tablet 0  . mometasone-formoterol (DULERA) 200-5 MCG/ACT AERO Inhale 2 puffs into the lungs 2 (two) times daily. 13 g 5  . montelukast (SINGULAIR) 10 MG tablet Take 1 tablet (10 mg total) by mouth at bedtime. 30 tablet 5  . oxyCODONE-acetaminophen (PERCOCET) 10-325 MG tablet Take 1 tablet by mouth every 4 (four) hours as needed for pain. 30 tablet 0  . Rimegepant Sulfate (NURTEC) 75 MG TBDP Take 75 mg by mouth daily as needed (take for  abortive therapy of migraine, no more than 1 tablet in 24 hours or 10 per month). 10 tablet 11  . tiZANidine (ZANAFLEX) 4 MG tablet TAKE 1 TABLET BY MOUTH EVERY 6 HOURS AS NEEDED FOR MUSCLE SPASMS 60 tablet 1  . topiramate (TOPAMAX) 100 MG tablet Take 1 tablet (100 mg total) by mouth 2 (two) times daily. 180 tablet 3  . zolpidem (AMBIEN) 10 MG tablet 1 p.o. nightly 30 tablet 2  . omeprazole (PRILOSEC) 40 MG capsule Take 1 capsule (40 mg total) by mouth daily. 30 capsule 2   No current facility-administered medications for this visit.   Allergies: Allergies  Allergen Reactions  . Peanut-Containing Drug Products Anaphylaxis  . Other     ALL NUTS   I reviewed her past medical history, social history, family history, and environmental history and no significant changes have been reported from her previous visit.  Review of Systems  Constitutional: Negative for appetite change, chills, fever and unexpected weight change.  HENT: Negative for congestion and rhinorrhea.   Eyes: Negative for itching.  Respiratory: Negative for cough, chest tightness, shortness of breath and wheezing.   Cardiovascular: Negative for chest pain.  Gastrointestinal: Positive for abdominal pain.  Genitourinary: Negative for difficulty urinating.  Skin:  Negative for rash.  Allergic/Immunologic: Positive for environmental allergies and food allergies.   Objective: BP 140/84 (BP Location: Right Arm, Patient Position: Sitting, Cuff Size: Normal)   Pulse (!) 57   Temp 97.7 F (36.5 C) (Temporal)   Resp 16   SpO2 100%  There is no height or weight on file to calculate BMI. Physical Exam  Constitutional: She is oriented to person, place, and time. She appears well-developed and well-nourished.  HENT:  Head: Normocephalic and atraumatic.  Right Ear: External ear normal.  Left Ear: External ear normal.  Nose: Nose normal.  Mouth/Throat: Oropharynx is clear and moist.  Eyes: Conjunctivae and EOM are normal.    Cardiovascular: Normal rate, regular rhythm and normal heart sounds. Exam reveals no gallop and no friction rub.  No murmur heard. Pulmonary/Chest: Effort normal and breath sounds normal. She has no wheezes. She has no rales.  Abdominal: Soft.  Musculoskeletal:     Cervical back: Neck supple.     Comments: Left arm in sling  Neurological: She is alert and oriented to person, place, and time.  Skin: Skin is warm. No rash noted.  Psychiatric: She has a normal mood and affect. Her behavior is normal.  Nursing note and vitals reviewed.  Previous notes and tests were reviewed. The plan was reviewed with the patient/family, and all questions/concerned were addressed.  It was my pleasure to see Kathy Hodge today and participate in her care. Please feel free to contact me with any questions or concerns.  Sincerely,  Rexene Alberts, DO Allergy & Immunology  [facility] office: [phone] [facility] office: [place] office: [phone]

## 2019-09-20 NOTE — Assessment & Plan Note (Signed)
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  Patient consumes limited sesame and finned fish.  2020 skin testing was positive to tree nuts, fish mix and sesame.  Negative to peanuts. 2020 bloodwork was positive to tree nuts, sesame.  Interim history - no reactions since last OV.  Continue strict avoidance of tree nuts, sesame.  For mild symptoms you can take over the counter antihistamines such as Benadryl and monitor symptoms closely. If symptoms worsen or if you have severe symptoms including breathing issues, throat closure, significant swelling, whole body hives, severe diarrhea and vomiting, lightheadedness then inject epinephrine and seek immediate medical care afterwards.  Food action plan in place.

## 2019-09-20 NOTE — Patient Instructions (Addendum)
Asthma, well controlled, moderate persistent  Daily controller medication(s):Dulera 200 2 puffs twice a day with spacer and rinse mouth afterwards.   Continue Singulair 10mg  daily.  Prior to physical activity:May use albuterol rescue inhaler 2 puffs 5 to 15 minutes prior to strenuous physical activities.  Rescue medications:May use albuterol rescue inhaler 2 puffs or nebulizer every 4 to 6 hours as needed for shortness of breath, chest tightness, coughing, and wheezing. Monitor frequency of use Asthma control goals:  Full participation in all desired activities (may need albuterol before activity) Albuterol use two times or less a week on average (not counting use with activity) Cough interfering with sleep two times or less a month Oral steroids no more than once a year No hospitalizations  Other allergic rhinitis 2020 immunocap positive to dog and borderline to tree pollen.   Continue environmental control measures.   May need to take the below allergy medications on a daily basis during the spring.  May use over the counter antihistamines such as Zyrtec (cetirizine), Claritin (loratadine), Allegra (fexofenadine), or Xyzal (levocetirizine) daily as needed.  May use Flonase 1 spray per nostril 1-2 times a day as needed for nasal congestion.  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   Continue strict avoidance of tree nuts, sesame.  For mild symptoms you can take over the counter antihistamines such as Benadryl and monitor symptoms closely. If symptoms worsen or if you have severe symptoms including breathing issues, throat closure, significant swelling, whole body hives, severe diarrhea and vomiting, lightheadedness then inject epinephrine and seek immediate medical care afterwards.  Food action plan in place.   Heartburn  Take omeprazole 40mg  in the morning. Nothing to eat or drink for 30 minutes.  Once off  pain medications and symptoms improve, you may try to stop the medication and monitor symptoms.   Follow up in 4 months or sooner if needed.

## 2019-09-21 ENCOUNTER — Encounter (INDEPENDENT_AMBULATORY_CARE_PROVIDER_SITE_OTHER): Payer: Self-pay

## 2019-09-22 ENCOUNTER — Encounter (INDEPENDENT_AMBULATORY_CARE_PROVIDER_SITE_OTHER): Payer: Self-pay

## 2019-09-23 ENCOUNTER — Telehealth: Payer: Self-pay | Admitting: *Deleted

## 2019-09-23 ENCOUNTER — Other Ambulatory Visit: Payer: Self-pay | Admitting: Orthopedic Surgery

## 2019-09-23 ENCOUNTER — Encounter (INDEPENDENT_AMBULATORY_CARE_PROVIDER_SITE_OTHER): Payer: Self-pay

## 2019-09-23 DIAGNOSIS — S43432A Superior glenoid labrum lesion of left shoulder, initial encounter: Secondary | ICD-10-CM

## 2019-09-23 NOTE — Telephone Encounter (Signed)
Appointment Request From: [name]  With Provider: Jeanmarie Hubert, MD [facility]]  Preferred Date Range: 09/22/2019 - 09/24/2019  Preferred Times: Any Time  Reason for visit: Office Visit  Comments: Pain in my legs and chest , been having pain in my legs and chest after having surgery on my left arm. And a little pain in my stomach and gas .   Called pt - stated she did go to the ED (09/15/19) for leg pain, abd and chest pain. But she continues to c/o right leg pain, stomach pain and pain between her breasts. Negative for DVT. Stated Omeprazole doesn't seem to be helped with gas or ?acid reflux. Denies right leg swelling, warm to touch. Stated she did called and talked to someone at the surgery ctr where she had the surgery (see ED note). [facility] appt scheduled for tomorrow @ 0915 AM. Instructed if pain worsen to go to ED or call 911. Please advise.

## 2019-09-23 NOTE — Telephone Encounter (Signed)
I agree with plan

## 2019-09-24 ENCOUNTER — Encounter: Payer: Self-pay | Admitting: Internal Medicine

## 2019-09-24 ENCOUNTER — Other Ambulatory Visit: Payer: Self-pay

## 2019-09-24 ENCOUNTER — Ambulatory Visit (INDEPENDENT_AMBULATORY_CARE_PROVIDER_SITE_OTHER): Payer: 59 | Admitting: Internal Medicine

## 2019-09-24 VITALS — BP 143/81 | HR 63 | Temp 98.5°F | Ht 69.0 in | Wt 224.2 lb

## 2019-09-24 DIAGNOSIS — R12 Heartburn: Secondary | ICD-10-CM

## 2019-09-24 DIAGNOSIS — M79605 Pain in left leg: Secondary | ICD-10-CM | POA: Diagnosis not present

## 2019-09-24 DIAGNOSIS — M79604 Pain in right leg: Secondary | ICD-10-CM | POA: Diagnosis not present

## 2019-09-24 DIAGNOSIS — Z79899 Other long term (current) drug therapy: Secondary | ICD-10-CM

## 2019-09-24 DIAGNOSIS — R0789 Other chest pain: Secondary | ICD-10-CM

## 2019-09-24 DIAGNOSIS — R109 Unspecified abdominal pain: Secondary | ICD-10-CM

## 2019-09-24 DIAGNOSIS — M79606 Pain in leg, unspecified: Secondary | ICD-10-CM | POA: Insufficient documentation
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
## 2019-09-24 NOTE — Assessment & Plan Note (Addendum)
Patient presents today with epigastric abdominal pain with substernal chest pain.  She states that the abdominal pain feels like bloating and gassy that comes and goes throughout the day.  The pain is worse while she is laying down.  She denies any nausea, vomiting, regurgitation.  She has tried PPIs for several months without success.  She has tried Gas-X with little success.  She states that she recently changed her diet to try to lose weight and has tried to limit acidic, fried, greasy foods.  She continues to drink sodas frequently.  She has recently seen GI and had an EGD which did not show any signs of gastritis/GERD.  They continued her on a PPI 40 mg daily.  She states that she does have significant constipation and her last bowel movement was 2 days ago.  She states that is not uncommon for her to go as long as 1 to 2 weeks without a bowel movement.  When she is able to have a bowel movement she denies changes in symptoms.  She remains on opioid pain medication for chronic back pain which is likely causing or contributing to her constipation.  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  Her differential diagnosis includes constipation 2/2 to medication, functional dyspepsia, SIBO, IBD.  I counseled her on the importance of decreasing fermentable sugars in her diet and keeping a log of her diet over the next 4 weeks to see if there is any associations with foods.   Chest pain is not concerning for cardiac etiology.  She was recently seen in the ED for this.  EKG, D-dimer, and lower extremity ultrasound were performed and were within normal limits.  Plan: -Asked the patient to keep a food diary to see if there is any associations with her abdominal pain. -Talk to her about getting on a good bowel regiment to relieve her constipation.  Asked her to start MiraLAX 1 capful daily.  I told her to titrate as needed to achieve a bowel movement daily. -Asked her to follow-up in 1 month to assess her  symptoms.

## 2019-09-24 NOTE — Patient Instructions (Addendum)
Thank you, Ms.Jeryl Columbia Dials for allowing [place] to provide your care today. Today we discussed Abdominal Pain.    I have ordered none labs for you. I will call if any are abnormal.    I have place a referrals to none   I have ordered the following tests: none   I have ordered the following medication/changed the following medications:  1. Miramax take 1 cap full every day to achieve 1 bowel movement a day 2. Keep food log.    Please follow-up in 4 weeks.    Should you have any questions or concerns please call the internal medicine clinic at [phone].    Marianna Payment, D.O. [facility]      Low-FODMAP Eating Plan  FODMAPs (fermentable oligosaccharides, disaccharides, monosaccharides, and polyols) are sugars that are hard for some people to digest. A low-FODMAP eating plan may help some people who have bowel (intestinal) diseases to manage their symptoms. This meal plan can be complicated to follow. Work with a diet and nutrition specialist (dietitian) to make a low-FODMAP eating plan that is right for you. A dietitian can make sure that you get enough nutrition from this diet. What are tips for following this plan? Reading food labels  Check labels for hidden FODMAPs such as: ? High-fructose syrup. ? Honey. ? Agave. ? Natural fruit flavors. ? Onion or garlic powder.  Choose low-FODMAP foods that contain 3-4 grams of fiber per serving.  Check food labels for serving sizes. Eat only one serving at a time to make sure FODMAP levels stay low. Meal planning  Follow a low-FODMAP eating plan for up to 6 weeks, or as told by your health care provider or dietitian.  To follow the eating plan: 1. Eliminate high-FODMAP foods from your diet completely. 2. Gradually reintroduce high-FODMAP foods into your diet one at a time. Most people should wait a few days after introducing one high-FODMAP food before they introduce the next high-FODMAP food. Your dietitian  can recommend how quickly you may reintroduce foods. 3. Keep a daily record of what you eat and drink, and make note of any symptoms that you have after eating. 4. Review your daily record with a dietitian regularly. Your dietitian can help you identify which foods you can eat and which foods you should avoid. General tips  Drink enough fluid each day to keep your urine pale yellow.  Avoid processed foods. These often have added sugar and may be high in FODMAPs.  Avoid most dairy products, whole grains, and sweeteners.  Work with a dietitian to make sure you get enough fiber in your diet. Recommended foods Grains  Gluten-free grains, such as rice, oats, buckwheat, quinoa, corn, polenta, and millet. Gluten-free pasta, bread, or cereal. Rice noodles. Corn tortillas. Vegetables  Eggplant, zucchini, cucumber, peppers, green beans, Brussels sprouts, bean sprouts, lettuce, arugula, kale, Swiss chard, spinach, collard greens, bok choy, summer squash, potato, and tomato. Limited amounts of corn, carrot, and sweet potato. Green parts of scallions. Fruits  Bananas, oranges, lemons, limes, blueberries, raspberries, strawberries, grapes, cantaloupe, honeydew melon, kiwi, papaya, passion fruit, and pineapple. Limited amounts of dried cranberries, banana chips, and shredded coconut. Dairy  Lactose-free milk, yogurt, and kefir. Lactose-free cottage cheese and ice cream. Non-dairy milks, such as almond, coconut, hemp, and rice milk. Yogurts made of non-dairy milks. Limited amounts of goat cheese, brie, mozzarella, parmesan, swiss, and other hard cheeses. Meats and other protein foods  Unseasoned beef, pork, poultry, or fish. Eggs. Berniece Salines. Tofu (  firm) and tempeh. Limited amounts of nuts and seeds, such as almonds, walnuts, [place] nuts, pecans, peanuts, pumpkin seeds, chia seeds, and sunflower seeds. Fats and oils  Butter-free spreads. Vegetable oils, such as olive, canola, and sunflower oil. Seasoning  and other foods  Artificial sweeteners with names that do not end in "ol" such as aspartame, saccharine, and stevia. Maple syrup, white table sugar, raw sugar, brown sugar, and molasses. Fresh basil, coriander, parsley, rosemary, and thyme. Beverages  Water and mineral water. Sugar-sweetened soft drinks. Small amounts of orange juice or cranberry juice. Black and green tea. Most dry wines. Coffee. This may not be a complete list of low-FODMAP foods. Talk with your dietitian for more information. Foods to avoid Grains  Wheat, including kamut, durum, and semolina. Barley and bulgur. Couscous. Wheat-based cereals. Wheat noodles, bread, crackers, and pastries. Vegetables  Chicory root, artichoke, asparagus, cabbage, snow peas, sugar snap peas, mushrooms, and cauliflower. Onions, garlic, leeks, and the white part of scallions. Fruits  Fresh, dried, and juiced forms of apple, pear, watermelon, peach, plum, cherries, apricots, blackberries, boysenberries, figs, nectarines, and mango. Avocado. Dairy  Milk, yogurt, ice cream, and soft cheese. Cream and sour cream. Milk-based sauces. Custard. Meats and other protein foods  Fried or fatty meat. Sausage. Cashews and pistachios. Soybeans, baked beans, black beans, chickpeas, kidney beans, fava beans, navy beans, lentils, and split peas. Seasoning and other foods  Any sugar-free gum or candy. Foods that contain artificial sweeteners such as sorbitol, mannitol, isomalt, or xylitol. Foods that contain honey, high-fructose corn syrup, or agave. Bouillon, vegetable stock, beef stock, and chicken stock. Garlic and onion powder. Condiments made with onion, such as hummus, chutney, pickles, relish, salad dressing, and salsa. Tomato paste. Beverages  Chicory-based drinks. Coffee substitutes. Chamomile tea. Fennel tea. Sweet or fortified wines such as port or sherry. Diet soft drinks made with isomalt, mannitol, maltitol, sorbitol, or xylitol. Apple, pear, and  mango juice. Juices with high-fructose corn syrup. This may not be a complete list of high-FODMAP foods. Talk with your dietitian to discuss what dietary choices are best for you.  Summary  A low-FODMAP eating plan is a short-term diet that eliminates FODMAPs from your diet to help ease symptoms of certain bowel diseases.  The eating plan usually lasts up to 6 weeks. After that, high-FODMAP foods are restarted gradually, one at a time, so you can find out which may be causing symptoms.  A low-FODMAP eating plan can be complicated. It is best to work with a dietitian who has experience with this type of plan. This information is not intended to replace advice given to you by your health care provider. Make sure you discuss any questions you have with your health care provider. Document Revised: 08/01/2017 Document Reviewed: 04/15/2017 Elsevier Patient Education  [place].

## 2019-09-24 NOTE — Assessment & Plan Note (Signed)
Bilateral leg pain, 2018  For a herniated disc and compression  See DR. Brookes for injections.

## 2019-09-28 ENCOUNTER — Encounter: Payer: Self-pay | Admitting: Internal Medicine

## 2019-09-30 NOTE — Progress Notes (Signed)
Internal Medicine Clinic Attending  Case discussed with Dr. Coe at the time of the visit.  We reviewed the resident's history and exam and pertinent patient test results.  I agree with the assessment, diagnosis, and plan of care documented in the resident's note.    

## 2019-10-02 ENCOUNTER — Other Ambulatory Visit: Payer: Self-pay

## 2019-10-02 ENCOUNTER — Emergency Department (HOSPITAL_BASED_OUTPATIENT_CLINIC_OR_DEPARTMENT_OTHER): Payer: 59

## 2019-10-02 ENCOUNTER — Encounter (HOSPITAL_BASED_OUTPATIENT_CLINIC_OR_DEPARTMENT_OTHER): Payer: Self-pay | Admitting: Emergency Medicine

## 2019-10-02 ENCOUNTER — Emergency Department (HOSPITAL_BASED_OUTPATIENT_CLINIC_OR_DEPARTMENT_OTHER)
Admission: EM | Admit: 2019-10-02 | Discharge: 2019-10-02 | Disposition: A | Discharge status: Home / Self Care | Payer: 59 | Attending: Emergency Medicine | Admitting: Emergency Medicine

## 2019-10-02 DIAGNOSIS — Z91010 Allergy to peanuts: Secondary | ICD-10-CM | POA: Diagnosis not present

## 2019-10-02 DIAGNOSIS — R1013 Epigastric pain: Secondary | ICD-10-CM | POA: Diagnosis present

## 2019-10-02 DIAGNOSIS — Z79899 Other long term (current) drug therapy: Secondary | ICD-10-CM | POA: Insufficient documentation

## 2019-10-02 DIAGNOSIS — Z87891 Personal history of nicotine dependence: Secondary | ICD-10-CM | POA: Insufficient documentation

## 2019-10-02 DIAGNOSIS — K219 Gastro-esophageal reflux disease without esophagitis: Secondary | ICD-10-CM | POA: Insufficient documentation

## 2019-10-02 DIAGNOSIS — J45909 Unspecified asthma, uncomplicated: Secondary | ICD-10-CM | POA: Diagnosis not present

## 2019-10-02 DIAGNOSIS — I10 Essential (primary) hypertension: Secondary | ICD-10-CM | POA: Insufficient documentation

## 2019-10-02 LAB — COMPREHENSIVE METABOLIC PANEL
ALT: 19 U/L (ref 0–44)
AST: 15 U/L (ref 15–41)
Albumin: 4.2 g/dL (ref 3.5–5.0)
Alkaline Phosphatase: 70 U/L (ref 38–126)
Anion gap: 5 (ref 5–15)
BUN: 11 mg/dL (ref 6–20)
CO2: 22 mmol/L (ref 22–32)
Calcium: 9.4 mg/dL (ref 8.9–10.3)
Chloride: 112 mmol/L — ABNORMAL HIGH (ref 98–111)
Creatinine, Ser: 0.8 mg/dL (ref 0.44–1.00)
GFR calc Af Amer: >60 mL/min (ref >60)
GFR calc non Af Amer: >60 mL/min (ref >60)
Glucose, Bld: 94 mg/dL (ref 70–99)
Potassium: 3.4 mmol/L — ABNORMAL LOW (ref 3.5–5.1)
Sodium: 139 mmol/L (ref 135–145)
Total Bilirubin: 0.7 mg/dL (ref 0.3–1.2)
Total Protein: 7.8 g/dL (ref 6.5–8.1)

## 2019-10-02 LAB — CBC WITH DIFFERENTIAL/PLATELET
Abs Immature Granulocytes: 0 10*3/uL (ref 0.00–0.07)
Basophils Absolute: 0 10*3/uL (ref 0.0–0.1)
Basophils Relative: 1 %
Eosinophils Absolute: 0 10*3/uL (ref 0.0–0.5)
Eosinophils Relative: 1 %
HCT: 39.3 % (ref 36.0–46.0)
Hemoglobin: 12.5 g/dL (ref 12.0–15.0)
Immature Granulocytes: 0 %
Lymphocytes Relative: 46 %
Lymphs Abs: 1.5 10*3/uL (ref 0.7–4.0)
MCH: 28.1 pg (ref 26.0–34.0)
MCHC: 31.8 g/dL (ref 30.0–36.0)
MCV: 88.3 fL (ref 80.0–100.0)
Monocytes Absolute: 0.2 10*3/uL (ref 0.1–1.0)
Monocytes Relative: 7 %
Neutro Abs: 1.5 10*3/uL — ABNORMAL LOW (ref 1.7–7.7)
Neutrophils Relative %: 45 %
Platelets: 294 10*3/uL (ref 150–400)
RBC: 4.45 MIL/uL (ref 3.87–5.11)
RDW: 12.3 % (ref 11.5–15.5)
WBC: 3.3 10*3/uL — ABNORMAL LOW (ref 4.0–10.5)
nRBC: 0 % (ref 0.0–0.2)

## 2019-10-02 LAB — TROPONIN I (HIGH SENSITIVITY): Troponin I (High Sensitivity): <2 ng/L (ref <18)

## 2019-10-02 LAB — LIPASE, BLOOD: Lipase: 26 U/L (ref 11–51)

## 2019-10-02 MED ORDER — HYDROCODONE-ACETAMINOPHEN 5-325 MG PO TABS
1.0000 | ORAL_TABLET | Freq: Once | ORAL | Status: AC
  Administered 2019-10-02: 1 via ORAL
  Filled 2019-10-02: qty 1

## 2019-10-02 MED ORDER — POTASSIUM CHLORIDE CRYS ER 20 MEQ PO TBCR
20.0000 meq | EXTENDED_RELEASE_TABLET | Freq: Once | ORAL | Status: AC
  Administered 2019-10-02: 20 meq via ORAL
  Filled 2019-10-02: qty 1

## 2019-10-02 MED ORDER — DICYCLOMINE HCL 20 MG PO TABS: 20.0000 mg | ORAL_TABLET | Freq: Two times a day (BID) | ORAL | 0 refills | Status: DC

## 2019-10-02 NOTE — ED Notes (Signed)
C/o acute abdominal pain radiating to back, pain is constant, increases with any po intake, even water, states no po intake for 2 days due to pain, denies vomiting, shoulder surgery on 1/11

## 2019-10-02 NOTE — Discharge Instructions (Signed)
As discussed, your chest x-ray, EKG, and labs all looked good today. I am sending you home a medication that treats IBS. You can take it twice a day. Call Monday to schedule an appointment with your GI doctor for further evaluation. This sounds like it is more of a chronic issue that needs to be followed until the correct combinations of medication can be found. Return to the ER for new or worsening symptoms.

## 2019-10-02 NOTE — ED Notes (Signed)
Attempted IV start x 2 without success, able to draw blood but unable to thread.

## 2019-10-02 NOTE — ED Triage Notes (Signed)
Headache, abd pain after eating with a lot of gas x 1 week.
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
## 2019-10-08 ENCOUNTER — Encounter: Payer: Self-pay | Admitting: Nurse Practitioner

## 2019-10-08 ENCOUNTER — Ambulatory Visit (INDEPENDENT_AMBULATORY_CARE_PROVIDER_SITE_OTHER): Payer: 59 | Admitting: Nurse Practitioner

## 2019-10-08 VITALS — BP 122/70 | HR 70 | Temp 97.4°F | Ht 69.0 in | Wt 223.5 lb

## 2019-10-08 DIAGNOSIS — R1010 Upper abdominal pain, unspecified: Secondary | ICD-10-CM | POA: Diagnosis not present

## 2019-10-08 DIAGNOSIS — K5900 Constipation, unspecified: Secondary | ICD-10-CM | POA: Diagnosis not present

## 2019-10-08 MED ORDER — DICYCLOMINE HCL 20 MG PO TABS: 20.0000 mg | ORAL_TABLET | Freq: Three times a day (TID) | ORAL | 3 refills | Status: DC
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
## 2019-10-08 NOTE — Patient Instructions (Signed)
If you are age 50 or older, your body mass index should be between 23-30. Your Body mass index is 33.01 kg/m. If this is out of the aforementioned range listed, please consider follow up with your Primary Care Provider.  If you are age 18 or younger, your body mass index should be between 19-25. Your Body mass index is 33.01 kg/m. If this is out of the aformentioned range listed, please consider follow up with your Primary Care Provider.   We have sent the following medications to your pharmacy for you to pick up at your convenience: Bentyl 20 mg - take three times daily.  Start Metamucil once daily. ( this is over-the-counter )  Take Miralax as needed. If no bowel movement in a couple of days, take one capful of Miralax.  Drink 64 ounces of water daily.  Call in two weeks with an update.  Thank you for choosing me and [facility].   Tye Savoy, NP

## 2019-10-10 NOTE — Progress Notes (Signed)
Reviewed. Agree with CT if symptoms persist

## 2019-10-12 ENCOUNTER — Ambulatory Visit: Payer: 59 | Attending: Internal Medicine

## 2019-10-12 DIAGNOSIS — Z20822 Contact with and (suspected) exposure to covid-19: Secondary | ICD-10-CM

## 2019-10-13 LAB — NOVEL CORONAVIRUS, NAA: SARS-CoV-2, NAA: NOT DETECTED

## 2019-11-05 ENCOUNTER — Other Ambulatory Visit: Payer: Self-pay | Admitting: Pulmonary Disease

## 2019-11-08 ENCOUNTER — Other Ambulatory Visit: Payer: Self-pay

## 2019-11-08 ENCOUNTER — Other Ambulatory Visit: Payer: Self-pay | Admitting: Allergy

## 2019-11-08 MED ORDER — DULERA 200-5 MCG/ACT IN AERO: 2.0000 | INHALATION_SPRAY | Freq: Two times a day (BID) | RESPIRATORY_TRACT | 2 refills | Status: DC

## 2019-11-09 ENCOUNTER — Other Ambulatory Visit: Payer: Self-pay | Admitting: *Deleted

## 2019-11-09 ENCOUNTER — Ambulatory Visit: Payer: 59 | Admitting: Family Medicine

## 2019-11-10 ENCOUNTER — Ambulatory Visit: Payer: 59 | Admitting: Certified Nurse Midwife

## 2019-11-12 ENCOUNTER — Emergency Department (HOSPITAL_COMMUNITY)
Admission: EM | Admit: 2019-11-12 | Discharge: 2019-11-12 | Disposition: A | Discharge status: Home / Self Care | Payer: 59 | Attending: Emergency Medicine | Admitting: Emergency Medicine

## 2019-11-12 ENCOUNTER — Other Ambulatory Visit: Payer: Self-pay

## 2019-11-12 ENCOUNTER — Ambulatory Visit (INDEPENDENT_AMBULATORY_CARE_PROVIDER_SITE_OTHER)
Admission: EM | Admit: 2019-11-12 | Discharge: 2019-11-12 | Disposition: A | Discharge status: Home / Self Care | Payer: 59 | Source: Home / Self Care

## 2019-11-12 ENCOUNTER — Encounter (HOSPITAL_COMMUNITY): Payer: Self-pay | Admitting: Emergency Medicine

## 2019-11-12 ENCOUNTER — Encounter (HOSPITAL_COMMUNITY): Payer: Self-pay

## 2019-11-12 DIAGNOSIS — Z5321 Procedure and treatment not carried out due to patient leaving prior to being seen by health care provider: Secondary | ICD-10-CM | POA: Insufficient documentation

## 2019-11-12 DIAGNOSIS — R109 Unspecified abdominal pain: Secondary | ICD-10-CM | POA: Diagnosis not present

## 2019-11-12 DIAGNOSIS — N3090 Cystitis, unspecified without hematuria: Secondary | ICD-10-CM

## 2019-11-12 DIAGNOSIS — N760 Acute vaginitis: Secondary | ICD-10-CM

## 2019-11-12 LAB — POCT URINALYSIS DIP (DEVICE)
Bilirubin Urine: NEGATIVE
Glucose, UA: NEGATIVE mg/dL
Hgb urine dipstick: NEGATIVE
Ketones, ur: NEGATIVE mg/dL
Nitrite: NEGATIVE
Protein, ur: NEGATIVE mg/dL
Specific Gravity, Urine: 1.02 (ref 1.005–1.030)
Urobilinogen, UA: 0.2 mg/dL (ref 0.0–1.0)
pH: 7 (ref 5.0–8.0)

## 2019-11-12 LAB — COMPREHENSIVE METABOLIC PANEL
ALT: 20 U/L (ref 0–44)
AST: 15 U/L (ref 15–41)
Albumin: 4.2 g/dL (ref 3.5–5.0)
Alkaline Phosphatase: 75 U/L (ref 38–126)
Anion gap: 10 (ref 5–15)
BUN: 11 mg/dL (ref 6–20)
CO2: 18 mmol/L — ABNORMAL LOW (ref 22–32)
Calcium: 9.3 mg/dL (ref 8.9–10.3)
Chloride: 111 mmol/L (ref 98–111)
Creatinine, Ser: 0.74 mg/dL (ref 0.44–1.00)
GFR calc Af Amer: >60 mL/min (ref >60)
GFR calc non Af Amer: >60 mL/min (ref >60)
Glucose, Bld: 105 mg/dL — ABNORMAL HIGH (ref 70–99)
Potassium: 4.1 mmol/L (ref 3.5–5.1)
Sodium: 139 mmol/L (ref 135–145)
Total Bilirubin: 0.5 mg/dL (ref 0.3–1.2)
Total Protein: 7.4 g/dL (ref 6.5–8.1)

## 2019-11-12 LAB — CBC
HCT: 40 % (ref 36.0–46.0)
Hemoglobin: 12.4 g/dL (ref 12.0–15.0)
MCH: 27.9 pg (ref 26.0–34.0)
MCHC: 31 g/dL (ref 30.0–36.0)
MCV: 90.1 fL (ref 80.0–100.0)
Platelets: 296 10*3/uL (ref 150–400)
RBC: 4.44 MIL/uL (ref 3.87–5.11)
RDW: 12.9 % (ref 11.5–15.5)
WBC: 7.8 10*3/uL (ref 4.0–10.5)
nRBC: 0 % (ref 0.0–0.2)

## 2019-11-12 LAB — LIPASE, BLOOD: Lipase: 26 U/L (ref 11–51)

## 2019-11-12 MED ORDER — FLUCONAZOLE 150 MG PO TABS: ORAL_TABLET | ORAL | 1 refills | Status: DC

## 2019-11-12 MED ORDER — METRONIDAZOLE 500 MG PO TABS: 500.0000 mg | ORAL_TABLET | Freq: Two times a day (BID) | ORAL | 0 refills | Status: DC

## 2019-11-12 MED ORDER — CEPHALEXIN 500 MG PO CAPS: 500.0000 mg | ORAL_CAPSULE | Freq: Two times a day (BID) | ORAL | 0 refills | Status: AC

## 2019-11-12 NOTE — Discharge Instructions (Signed)
Take the metronidazole and Keflex twice a day for 7 days  Take 1 Diflucan today and then 1 at the end of the metronidazole and Keflex  We have sent your swab will notify you of any additional treatments necessary  You may also follow-up with the other clinic that you have had care with and testing.  If you develop fever or have worsening pain please return to clinic

## 2019-11-12 NOTE — ED Triage Notes (Signed)
Pt reports being seen at [facility] for vaginal discharge but it continues. Endorses frequent urination, lower abd pain and nausea.

## 2019-11-12 NOTE — ED Triage Notes (Signed)
Pt is here with vaginal discharge/odor with frequent urination that started 10 days ago and abdominal pain that started 3 days ago.
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
## 2019-11-14 LAB — URINE CULTURE: Culture: NO GROWTH

## 2019-11-16 ENCOUNTER — Other Ambulatory Visit: Payer: Self-pay | Admitting: Pulmonary Disease

## 2019-11-16 ENCOUNTER — Telehealth: Payer: Self-pay | Admitting: Pulmonary Disease

## 2019-11-16 MED ORDER — ZOLPIDEM TARTRATE 10 MG PO TABS: ORAL_TABLET | ORAL | 2 refills | Status: AC

## 2019-11-16 NOTE — Telephone Encounter (Signed)
Refill request received from [place], requesting zolpidem 10mg  refill. Last prescription was for #30, with 2 refills 07/27/19.  Message routed to Dr.Olalere to advise

## 2019-11-16 NOTE — Telephone Encounter (Signed)
refilled 

## 2019-11-17 LAB — CERVICOVAGINAL ANCILLARY ONLY
Bacterial vaginitis: NEGATIVE
Candida vaginitis: NEGATIVE
Chlamydia: NEGATIVE
Neisseria Gonorrhea: NEGATIVE
Trichomonas: POSITIVE — AB

## 2019-11-25 ENCOUNTER — Encounter: Payer: Self-pay | Admitting: Physician Assistant

## 2019-11-25 ENCOUNTER — Other Ambulatory Visit: Payer: Self-pay

## 2019-11-25 ENCOUNTER — Ambulatory Visit (INDEPENDENT_AMBULATORY_CARE_PROVIDER_SITE_OTHER): Payer: 59 | Admitting: Physician Assistant

## 2019-11-25 VITALS — BP 118/84 | HR 64 | Temp 98.5°F | Ht 69.0 in | Wt 218.5 lb

## 2019-11-25 DIAGNOSIS — R109 Unspecified abdominal pain: Secondary | ICD-10-CM | POA: Diagnosis not present

## 2019-11-25 DIAGNOSIS — R142 Eructation: Secondary | ICD-10-CM

## 2019-11-25 NOTE — Progress Notes (Signed)
Assessment and plan reviewed.  Anderson Malta, have her follow-up with you after instituting therapies.
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
## 2019-11-25 NOTE — Patient Instructions (Signed)
If you are age 50 or older, your body mass index should be between 23-30. Your Body mass index is 32.27 kg/m. If this is out of the aforementioned range listed, please consider follow up with your Primary Care Provider.  If you are age 21 or younger, your body mass index should be between 19-25. Your Body mass index is 32.27 kg/m. If this is out of the aformentioned range listed, please consider follow up with your Primary Care Provider.   You have been scheduled for a CT scan of the abdomen and pelvis at [facility] ([address]---this is in the same building as [employer]).   You are scheduled on 11/30/19 at 1:00 pm. You should arrive 15 minutes prior to your appointment time for registroatin. Please follow the written instructions below on the day of your exam:  WARNING: IF YOU ARE ALLERGIC TO IODINE/X-RAY DYE, PLEASE NOTIFY RADIOLOGY IMMEDIATELY AT [phone]! YOU WILL BE GIVEN A 13 HOUR PREMEDICATION PREP.  1) Do not eat or drink anything after 9:00 am (4 hours prior to your test) 2) You have been given 2 bottles of oral contrast to drink. The solution may taste better if refrigerated, but do NOT add ice or any other liquid to this solution. Shake well before drinking.    Drink 1 bottle of contrast @ 11:00 am (2 hours prior to your exam)  Drink 1 bottle of contrast @ 12:00 pm (1 hour prior to your exam)  You may take any medications as prescribed with a small amount of water, if necessary. If you take any of the following medications: METFORMIN, GLUCOPHAGE, GLUCOVANCE, AVANDAMET, RIOMET, FORTAMET, [place] MET, JANUMET, GLUMETZA or METAGLIP, you MAY be asked to HOLD this medication 48 hours AFTER the exam.  The purpose of you drinking the oral contrast is to aid in the visualization of your intestinal tract. The contrast solution may cause some diarrhea. Depending on your individual set of symptoms, you may also receive an intravenous injection of x-ray  contrast/dye. Plan on being at [facility] for 30 minutes or longer, depending on the type of exam you are having performed.  This test typically takes 30-45 minutes to complete.  If you have any questions regarding your exam or if you need to reschedule, you may call the CT department at [phone] between the hours of 8:00 am and 5:00 pm, Monday-Friday.  ________________________________________________________________________  Follow up pending results of the CT

## 2019-11-26 ENCOUNTER — Ambulatory Visit (INDEPENDENT_AMBULATORY_CARE_PROVIDER_SITE_OTHER): Payer: 59 | Admitting: Internal Medicine

## 2019-11-26 ENCOUNTER — Telehealth: Payer: Self-pay

## 2019-11-26 ENCOUNTER — Telehealth: Payer: Self-pay | Admitting: Internal Medicine

## 2019-11-26 VITALS — BP 109/69 | HR 78 | Temp 98.3°F | Ht 69.0 in

## 2019-11-26 DIAGNOSIS — L299 Pruritus, unspecified: Secondary | ICD-10-CM | POA: Diagnosis not present

## 2019-11-26 MED ORDER — HYDROCORTISONE 1 % EX LOTN: 1.0000 "application " | TOPICAL_LOTION | Freq: Two times a day (BID) | CUTANEOUS | 0 refills | Status: DC

## 2019-11-26 NOTE — Telephone Encounter (Signed)
Received notification from [facility] that they were unable to do the patient's CT and they would have to cancel. She has been rescheduled for the same day 11/30/19 at 1:30 at the [facility]. The patient has been notified of the change in location and time.
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
## 2019-11-26 NOTE — Telephone Encounter (Signed)
Patient states she received a call from our office from [place]. I dont know anyone by the name of Kathy Hodge that works with [place] - checked referrals, no referral was put in proficient nor epic. Checked discharge papers and nothing there either.

## 2019-11-26 NOTE — Patient Instructions (Signed)
Thank you for allowing [place] to care for you  For your itching - Likely reaction to sheets or detergent at hotel - Use steroid lotion BID - Take benadryl every 6 hours for 1-2 days, then as needed up to every 6 hours - Contact Allergist if this persists

## 2019-11-26 NOTE — Assessment & Plan Note (Signed)
Patient reports diffuse itching after staying at a hotel. She denies seeing any insects or similar in the room. No other person staying in the room is having symptoms. She has no signs of bite or irritations on her skin.  She has an atopic history and reports having itching after staying in hotels in the past. She typically brings her own sheets, which prevents this issue. She did not bring her own sheets this time as she was only staying for one night.   I suspect her itching is due to mild contact dermatitis from hotel fabrics or fabric cleaner. She has tried some benadryl without much relief. Will have her continue with benadryl schedule for a couple of days and add a steroid lotion. If her symptoms fail to improve she is instructed to contact her allergist. - Benadryl 25mg  q6h for 1-2 days, then up to q6h PRN - Cortisone lotion BID for up to 12 days - Follow up with allergist if symptoms fail to improve - Contact us if signs of bites develop

## 2019-11-27 ENCOUNTER — Emergency Department (HOSPITAL_BASED_OUTPATIENT_CLINIC_OR_DEPARTMENT_OTHER)
Admission: EM | Admit: 2019-11-27 | Discharge: 2019-11-27 | Disposition: A | Discharge status: Home / Self Care | Payer: 59 | Attending: Emergency Medicine | Admitting: Emergency Medicine

## 2019-11-27 ENCOUNTER — Encounter (HOSPITAL_BASED_OUTPATIENT_CLINIC_OR_DEPARTMENT_OTHER): Payer: Self-pay | Admitting: Emergency Medicine

## 2019-11-27 ENCOUNTER — Other Ambulatory Visit: Payer: Self-pay

## 2019-11-27 DIAGNOSIS — Z87891 Personal history of nicotine dependence: Secondary | ICD-10-CM | POA: Insufficient documentation

## 2019-11-27 DIAGNOSIS — J45909 Unspecified asthma, uncomplicated: Secondary | ICD-10-CM | POA: Insufficient documentation

## 2019-11-27 DIAGNOSIS — L299 Pruritus, unspecified: Secondary | ICD-10-CM | POA: Diagnosis not present

## 2019-11-27 DIAGNOSIS — I10 Essential (primary) hypertension: Secondary | ICD-10-CM | POA: Insufficient documentation

## 2019-11-27 MED ORDER — HYDROXYZINE HCL 25 MG PO TABS: 25.0000 mg | ORAL_TABLET | Freq: Three times a day (TID) | ORAL | 0 refills | Status: DC | PRN

## 2019-11-27 MED ORDER — DEXAMETHASONE SODIUM PHOSPHATE 10 MG/ML IJ SOLN
10.0000 mg | Freq: Once | INTRAMUSCULAR | Status: AC
  Administered 2019-11-27: 17:00:00 10 mg via INTRAMUSCULAR
  Filled 2019-11-27: qty 1

## 2019-11-27 NOTE — ED Notes (Signed)
ED Provider at bedside. 

## 2019-11-27 NOTE — Discharge Instructions (Signed)
Continue using the hydrocortisone cream and Aquaphor ointment to your skin.  Use the Atarax as needed for itching.  Follow-up with your primary care doctor if your symptoms are not improving.

## 2019-11-27 NOTE — ED Triage Notes (Signed)
Pt states that she has had itching all over since Thursday  - the patient reports that she has tried hydrocortisone but it is not helping
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
## 2019-11-29 ENCOUNTER — Other Ambulatory Visit: Payer: Self-pay

## 2019-11-29 ENCOUNTER — Telehealth: Payer: Self-pay

## 2019-11-29 ENCOUNTER — Ambulatory Visit (INDEPENDENT_AMBULATORY_CARE_PROVIDER_SITE_OTHER): Payer: 59 | Admitting: Internal Medicine

## 2019-11-29 DIAGNOSIS — L299 Pruritus, unspecified: Secondary | ICD-10-CM

## 2019-11-29 MED ORDER — PERMETHRIN 5 % EX CREA: 1.0000 "application " | TOPICAL_CREAM | Freq: Once | CUTANEOUS | 0 refills | Status: AC

## 2019-11-29 NOTE — Telephone Encounter (Signed)
From: [name]  Sent: 11/28/2019  4:22 PM EDT  To: Imp Front Desk Pool  Subject: Appointment Request                 Appointment Request From: [name]    With Provider: Jeanmarie Hubert, MD [facility]]    Preferred Date Range: Any date 11/29/2019 or later    Preferred Times: Any Time    Reason for visit: Office Visit    Comments:  When I went to the hospital they checked my body , so it's not bed bug cause I don't have no bite marks. Whtever it is it's in my shin that's causes me to itch all over. And my granddaughter was fine until she slept in my bed . This didn't happened until  I stayed overnight at a hotel with a friend . And ever since then I been itching and that's been since Thursday.     Above message copied from my chart message which was received from patient. TC to patient, c/o continued itching.  Denies any rashes or bug bites.  States this happened once before and was treated for scabies, requesting appt. Telehealth appt today in [facility] at 1:45 per Dr. Trilby Drummer. SChaplin, RN,BSN

## 2019-11-29 NOTE — Assessment & Plan Note (Signed)
Patient continues to experience diffuse itching that is worse at night. She was seen in the ED for this since the last time we spoke. She continues to deny any skin changes. She states she had similar symptoms previously after staying in a hotel and was treated by her doctor in [place] for scabies (despite not having any skin changes at that time either) and this improved her symptoms.  Given her severe symptoms which are worse at night (and not relieved by lotions, antihistamines, or steroids) and previous similar presentation, will treat with Topical Permethrin. Patient advised to apply in the evening and keep on overnight (at least 8 hours). She is advised to wash all clothes and sheets used and have any family members do the same if they are experiencing symptoms (they should also speak to there doctors if they have continued symptoms). - Permethrin 5% Cream, apply to whole body and leave on for 8-14 hours. Wash off in the morning - If symptoms persist for a week, will try second application - If no response to this therapy, patient is to speak with Allergist and/or a Dermatology referral may need to be considered - Clean all clothes and bedding, blankets, cloth item (or place in plastic bad for at least 3 days) - Clean rooms thoroughly

## 2019-11-29 NOTE — Progress Notes (Signed)
Internal Medicine Clinic Attending  Case discussed with Dr. Melvin  at the time of the visit.  We reviewed the resident's history and exam and pertinent patient test results.  I agree with the assessment, diagnosis, and plan of care documented in the resident's note.  
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
## 2019-11-30 ENCOUNTER — Encounter (HOSPITAL_BASED_OUTPATIENT_CLINIC_OR_DEPARTMENT_OTHER): Payer: Self-pay

## 2019-11-30 ENCOUNTER — Ambulatory Visit (HOSPITAL_BASED_OUTPATIENT_CLINIC_OR_DEPARTMENT_OTHER)
Admission: RE | Admit: 2019-11-30 | Discharge: 2019-11-30 | Disposition: A | Discharge status: Home / Self Care | Payer: 59 | Source: Ambulatory Visit | Attending: Physician Assistant | Admitting: Physician Assistant

## 2019-11-30 ENCOUNTER — Other Ambulatory Visit: Payer: Self-pay

## 2019-11-30 ENCOUNTER — Inpatient Hospital Stay: Admission: RE | Admit: 2019-11-30 | Payer: 59 | Source: Ambulatory Visit

## 2019-11-30 DIAGNOSIS — R109 Unspecified abdominal pain: Secondary | ICD-10-CM | POA: Insufficient documentation

## 2019-11-30 MED ORDER — IOHEXOL 300 MG/ML  SOLN
100.0000 mL | Freq: Once | INTRAMUSCULAR | Status: AC | PRN
  Administered 2019-11-30: 100 mL via INTRAVENOUS

## 2019-12-01 ENCOUNTER — Other Ambulatory Visit: Payer: Self-pay

## 2019-12-01 MED ORDER — RIFAXIMIN 550 MG PO TABS: 550.0000 mg | ORAL_TABLET | Freq: Three times a day (TID) | ORAL | 0 refills | Status: AC

## 2019-12-16 ENCOUNTER — Other Ambulatory Visit: Payer: Self-pay | Admitting: Internal Medicine

## 2019-12-21 ENCOUNTER — Encounter: Payer: Self-pay | Admitting: *Deleted

## 2019-12-24 ENCOUNTER — Ambulatory Visit (HOSPITAL_COMMUNITY)
Admission: EM | Admit: 2019-12-24 | Discharge: 2019-12-24 | Disposition: A | Discharge status: Home / Self Care | Payer: 59 | Attending: Urgent Care | Admitting: Urgent Care

## 2019-12-24 ENCOUNTER — Other Ambulatory Visit: Payer: Self-pay

## 2019-12-24 DIAGNOSIS — N898 Other specified noninflammatory disorders of vagina: Secondary | ICD-10-CM | POA: Insufficient documentation

## 2019-12-24 DIAGNOSIS — R102 Pelvic and perineal pain: Secondary | ICD-10-CM

## 2019-12-24 DIAGNOSIS — R300 Dysuria: Secondary | ICD-10-CM | POA: Diagnosis present

## 2019-12-24 DIAGNOSIS — N760 Acute vaginitis: Secondary | ICD-10-CM | POA: Insufficient documentation

## 2019-12-24 LAB — POCT URINALYSIS DIP (DEVICE)
Bilirubin Urine: NEGATIVE
Glucose, UA: NEGATIVE mg/dL
Ketones, ur: NEGATIVE mg/dL
Nitrite: NEGATIVE
Protein, ur: NEGATIVE mg/dL
Specific Gravity, Urine: 1.025 (ref 1.005–1.030)
Urobilinogen, UA: 1 mg/dL (ref 0.0–1.0)
pH: 7 (ref 5.0–8.0)

## 2019-12-24 MED ORDER — METRONIDAZOLE 500 MG PO TABS: 500.0000 mg | ORAL_TABLET | Freq: Two times a day (BID) | ORAL | 0 refills | Status: DC

## 2019-12-24 MED ORDER — FLUCONAZOLE 150 MG PO TABS: 150.0000 mg | ORAL_TABLET | ORAL | 0 refills | Status: DC

## 2019-12-24 NOTE — ED Triage Notes (Signed)
Pt c/o lower mid abdominal pain with painful urination and vaginal discharge since Wednesday. Pt had hysterectomy in 2012.

## 2019-12-24 NOTE — Discharge Instructions (Addendum)
Avoid all forms of sexual intercourse (oral, vaginal, anal) for the next 7 days to avoid spreading/reinfecting. Return if symptoms worsen/do not resolve, you develop fever, abdominal pain, blood in your urine, or are re-exposed to an STI.  
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
## 2019-12-25 LAB — URINE CULTURE: Culture: <10000 — AB

## 2019-12-27 LAB — CERVICOVAGINAL ANCILLARY ONLY
Bacterial Vaginitis (gardnerella): NEGATIVE
Candida Glabrata: NEGATIVE
Candida Vaginitis: NEGATIVE
Chlamydia: NEGATIVE
Comment: NEGATIVE
Comment: NEGATIVE
Comment: NEGATIVE
Comment: NEGATIVE
Comment: NEGATIVE
Comment: NORMAL
Neisseria Gonorrhea: NEGATIVE
Trichomonas: NEGATIVE

## 2019-12-31 ENCOUNTER — Other Ambulatory Visit: Payer: Self-pay

## 2019-12-31 ENCOUNTER — Emergency Department (HOSPITAL_COMMUNITY): Payer: 59

## 2019-12-31 ENCOUNTER — Encounter (HOSPITAL_COMMUNITY): Payer: Self-pay

## 2019-12-31 ENCOUNTER — Emergency Department (HOSPITAL_COMMUNITY)
Admission: EM | Admit: 2019-12-31 | Discharge: 2019-12-31 | Disposition: A | Discharge status: Home / Self Care | Payer: 59 | Attending: Emergency Medicine | Admitting: Emergency Medicine

## 2019-12-31 DIAGNOSIS — I10 Essential (primary) hypertension: Secondary | ICD-10-CM | POA: Diagnosis not present

## 2019-12-31 DIAGNOSIS — Z79899 Other long term (current) drug therapy: Secondary | ICD-10-CM | POA: Insufficient documentation

## 2019-12-31 DIAGNOSIS — J45909 Unspecified asthma, uncomplicated: Secondary | ICD-10-CM | POA: Diagnosis not present

## 2019-12-31 DIAGNOSIS — Z91010 Allergy to peanuts: Secondary | ICD-10-CM | POA: Insufficient documentation

## 2019-12-31 DIAGNOSIS — M545 Low back pain, unspecified: Secondary | ICD-10-CM

## 2019-12-31 DIAGNOSIS — Z87891 Personal history of nicotine dependence: Secondary | ICD-10-CM | POA: Diagnosis not present

## 2019-12-31 DIAGNOSIS — M549 Dorsalgia, unspecified: Secondary | ICD-10-CM | POA: Diagnosis present

## 2019-12-31 DIAGNOSIS — Z8616 Personal history of COVID-19: Secondary | ICD-10-CM | POA: Diagnosis not present

## 2019-12-31 LAB — CBC WITH DIFFERENTIAL/PLATELET
Abs Immature Granulocytes: 0.02 10*3/uL (ref 0.00–0.07)
Basophils Absolute: 0 10*3/uL (ref 0.0–0.1)
Basophils Relative: 1 %
Eosinophils Absolute: 0 10*3/uL (ref 0.0–0.5)
Eosinophils Relative: 1 %
HCT: 37.9 % (ref 36.0–46.0)
Hemoglobin: 11.6 g/dL — ABNORMAL LOW (ref 12.0–15.0)
Immature Granulocytes: 0 %
Lymphocytes Relative: 42 %
Lymphs Abs: 1.9 10*3/uL (ref 0.7–4.0)
MCH: 28.6 pg (ref 26.0–34.0)
MCHC: 30.6 g/dL (ref 30.0–36.0)
MCV: 93.3 fL (ref 80.0–100.0)
Monocytes Absolute: 0.4 10*3/uL (ref 0.1–1.0)
Monocytes Relative: 9 %
Neutro Abs: 2.2 10*3/uL (ref 1.7–7.7)
Neutrophils Relative %: 47 %
Platelets: 238 10*3/uL (ref 150–400)
RBC: 4.06 MIL/uL (ref 3.87–5.11)
RDW: 12.9 % (ref 11.5–15.5)
WBC: 4.7 10*3/uL (ref 4.0–10.5)
nRBC: 0 % (ref 0.0–0.2)

## 2019-12-31 LAB — COMPREHENSIVE METABOLIC PANEL
ALT: 19 U/L (ref 0–44)
AST: 23 U/L (ref 15–41)
Albumin: 3.6 g/dL (ref 3.5–5.0)
Alkaline Phosphatase: 64 U/L (ref 38–126)
Anion gap: 9 (ref 5–15)
BUN: 10 mg/dL (ref 6–20)
CO2: 22 mmol/L (ref 22–32)
Calcium: 9 mg/dL (ref 8.9–10.3)
Chloride: 110 mmol/L (ref 98–111)
Creatinine, Ser: 0.92 mg/dL (ref 0.44–1.00)
GFR calc Af Amer: >60 mL/min (ref >60)
GFR calc non Af Amer: >60 mL/min (ref >60)
Glucose, Bld: 111 mg/dL — ABNORMAL HIGH (ref 70–99)
Potassium: 3.8 mmol/L (ref 3.5–5.1)
Sodium: 141 mmol/L (ref 135–145)
Total Bilirubin: 0.5 mg/dL (ref 0.3–1.2)
Total Protein: 6.3 g/dL — ABNORMAL LOW (ref 6.5–8.1)

## 2019-12-31 LAB — URINALYSIS, ROUTINE W REFLEX MICROSCOPIC
Bilirubin Urine: NEGATIVE
Glucose, UA: NEGATIVE mg/dL
Hgb urine dipstick: NEGATIVE
Ketones, ur: NEGATIVE mg/dL
Leukocytes,Ua: NEGATIVE
Nitrite: NEGATIVE
Protein, ur: NEGATIVE mg/dL
Specific Gravity, Urine: 1.024 (ref 1.005–1.030)
pH: 6 (ref 5.0–8.0)

## 2019-12-31 MED ORDER — DIAZEPAM 2 MG PO TABS
2.0000 mg | ORAL_TABLET | Freq: Two times a day (BID) | ORAL | 0 refills | Status: DC
Start: 2019-12-31 — End: 2020-02-23

## 2019-12-31 MED ORDER — DIAZEPAM 2 MG PO TABS
2.0000 mg | ORAL_TABLET | Freq: Once | ORAL | Status: AC
  Administered 2019-12-31: 2 mg via ORAL
  Filled 2019-12-31: qty 1

## 2019-12-31 MED ORDER — ONDANSETRON HCL 4 MG/2ML IJ SOLN
4.0000 mg | Freq: Once | INTRAMUSCULAR | Status: AC
  Administered 2019-12-31: 4 mg via INTRAVENOUS
  Filled 2019-12-31: qty 2

## 2019-12-31 MED ORDER — HYDROMORPHONE HCL 1 MG/ML IJ SOLN
1.0000 mg | Freq: Once | INTRAMUSCULAR | Status: AC
  Administered 2019-12-31: 19:00:00 1 mg via INTRAVENOUS
  Filled 2019-12-31: qty 1

## 2019-12-31 MED ORDER — HYDROMORPHONE HCL 1 MG/ML IJ SOLN
1.0000 mg | Freq: Once | INTRAMUSCULAR | Status: AC
  Administered 2019-12-31: 1 mg via INTRAVENOUS
  Filled 2019-12-31: qty 1

## 2019-12-31 MED ORDER — MORPHINE SULFATE (PF) 4 MG/ML IV SOLN
4.0000 mg | Freq: Once | INTRAVENOUS | Status: AC
  Administered 2019-12-31: 19:00:00 4 mg via INTRAVENOUS
  Filled 2019-12-31: qty 1

## 2019-12-31 MED ORDER — SODIUM CHLORIDE 0.9 % IV BOLUS
1000.0000 mL | Freq: Once | INTRAVENOUS | Status: AC
  Administered 2019-12-31: 1000 mL via INTRAVENOUS

## 2019-12-31 NOTE — ED Triage Notes (Signed)
Pt bib [facility] w/ c/o back pain. Pt had LP done at [facility] d/t migraines per EMS. During procedure staff was only able to go in 2 cm and had to stop d/t severe pain. Pt has hx of back surgery. Pt took 10 mg percocet and a muscle relaxer prior to calling EMS. Pt reports 10/10 pain. Pt tearful on arrival. EMS VSS.

## 2019-12-31 NOTE — ED Notes (Signed)
Patient verbalizes understanding of discharge instructions. Opportunity for questioning and answers were provided. Armband removed by staff, pt discharged from ED ambulatory.   
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
## 2019-12-31 NOTE — Discharge Instructions (Addendum)
Follow up with neurologist.  You may take your home oxycodone.  We have also written you for some additional Valium.  Return for any new or worsening symptoms.

## 2020-01-01 ENCOUNTER — Other Ambulatory Visit: Payer: Self-pay

## 2020-01-01 ENCOUNTER — Encounter (HOSPITAL_COMMUNITY): Payer: Self-pay

## 2020-01-01 ENCOUNTER — Emergency Department (HOSPITAL_COMMUNITY)
Admission: EM | Admit: 2020-01-01 | Discharge: 2020-01-02 | Disposition: A | Discharge status: Home / Self Care | Payer: 59 | Attending: Emergency Medicine | Admitting: Emergency Medicine

## 2020-01-01 DIAGNOSIS — Z79899 Other long term (current) drug therapy: Secondary | ICD-10-CM | POA: Diagnosis not present

## 2020-01-01 DIAGNOSIS — M545 Low back pain: Secondary | ICD-10-CM | POA: Diagnosis not present

## 2020-01-01 DIAGNOSIS — Z87891 Personal history of nicotine dependence: Secondary | ICD-10-CM | POA: Insufficient documentation

## 2020-01-01 DIAGNOSIS — G8918 Other acute postprocedural pain: Secondary | ICD-10-CM | POA: Diagnosis not present

## 2020-01-01 DIAGNOSIS — G8929 Other chronic pain: Secondary | ICD-10-CM

## 2020-01-01 DIAGNOSIS — I10 Essential (primary) hypertension: Secondary | ICD-10-CM | POA: Diagnosis not present

## 2020-01-01 MED ORDER — LIDOCAINE 5 % EX PTCH: 1.0000 | MEDICATED_PATCH | CUTANEOUS | 0 refills | Status: DC

## 2020-01-01 MED ORDER — METHOCARBAMOL 500 MG PO TABS
500.0000 mg | ORAL_TABLET | Freq: Once | ORAL | Status: AC
  Administered 2020-01-01: 22:00:00 500 mg via ORAL
  Filled 2020-01-01: qty 1

## 2020-01-01 MED ORDER — METHOCARBAMOL 500 MG PO TABS
500.0000 mg | ORAL_TABLET | Freq: Two times a day (BID) | ORAL | 0 refills | Status: DC
Start: 2020-01-01 — End: 2020-02-23

## 2020-01-01 MED ORDER — ACETAMINOPHEN 325 MG PO TABS
650.0000 mg | ORAL_TABLET | Freq: Once | ORAL | Status: AC
  Administered 2020-01-01: 650 mg via ORAL
  Filled 2020-01-01: qty 2

## 2020-01-01 MED ORDER — MORPHINE SULFATE (PF) 4 MG/ML IV SOLN
4.0000 mg | Freq: Once | INTRAVENOUS | Status: AC
  Administered 2020-01-01: 4 mg via INTRAVENOUS
  Filled 2020-01-01: qty 1

## 2020-01-01 MED ORDER — HYDROMORPHONE HCL 1 MG/ML IJ SOLN
1.0000 mg | Freq: Once | INTRAMUSCULAR | Status: AC
  Administered 2020-01-01: 1 mg via INTRAVENOUS
  Filled 2020-01-01: qty 1
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
## 2020-01-01 NOTE — Discharge Instructions (Addendum)
Continue taking the medications you are prescribed to help with your pain. You can also take Robaxin as well as lidocaine patches to help with your symptoms. It is important for you to follow-up with your neurologist and spine specialist soon as possible. Return to the ER if you start to experience worsening pain, chest pain, shortness of breath, numbness in your arms or your legs, injuries or falls, losing control of your bowels or bladder.

## 2020-01-01 NOTE — ED Triage Notes (Signed)
Pt presents with mid back pain and headache after having a spinal tap yesterday at [facility]. They were unable to complete the spinal tap. Pt seen here yesterday for the same

## 2020-01-02 ENCOUNTER — Other Ambulatory Visit: Payer: Self-pay

## 2020-01-02 ENCOUNTER — Encounter (HOSPITAL_BASED_OUTPATIENT_CLINIC_OR_DEPARTMENT_OTHER): Payer: Self-pay

## 2020-01-02 ENCOUNTER — Emergency Department (HOSPITAL_BASED_OUTPATIENT_CLINIC_OR_DEPARTMENT_OTHER)
Admission: EM | Admit: 2020-01-02 | Discharge: 2020-01-02 | Disposition: A | Discharge status: Home / Self Care | Payer: 59 | Source: Home / Self Care | Attending: Emergency Medicine | Admitting: Emergency Medicine

## 2020-01-02 DIAGNOSIS — M545 Low back pain, unspecified: Secondary | ICD-10-CM

## 2020-01-02 DIAGNOSIS — Z87891 Personal history of nicotine dependence: Secondary | ICD-10-CM | POA: Insufficient documentation

## 2020-01-02 DIAGNOSIS — I10 Essential (primary) hypertension: Secondary | ICD-10-CM | POA: Insufficient documentation

## 2020-01-02 DIAGNOSIS — Z79899 Other long term (current) drug therapy: Secondary | ICD-10-CM | POA: Insufficient documentation

## 2020-01-02 DIAGNOSIS — Z91010 Allergy to peanuts: Secondary | ICD-10-CM | POA: Insufficient documentation

## 2020-01-02 MED ORDER — HYDROMORPHONE HCL 1 MG/ML IJ SOLN
2.0000 mg | Freq: Once | INTRAMUSCULAR | Status: AC
  Administered 2020-01-02: 2 mg via INTRAMUSCULAR
  Filled 2020-01-02: qty 2

## 2020-01-02 MED ORDER — HYDROMORPHONE HCL 1 MG/ML IJ SOLN
2.0000 mg | Freq: Once | INTRAMUSCULAR | Status: AC
  Administered 2020-01-02: 22:00:00 2 mg via INTRAMUSCULAR
  Filled 2020-01-02: qty 2

## 2020-01-02 MED ORDER — KETOROLAC TROMETHAMINE 60 MG/2ML IM SOLN
60.0000 mg | Freq: Once | INTRAMUSCULAR | Status: AC
  Administered 2020-01-02: 60 mg via INTRAMUSCULAR
  Filled 2020-01-02: qty 2

## 2020-01-02 NOTE — ED Notes (Signed)
Patient verbalized understanding of d/c instructions, follow up care, prescriptions and s/s requiring return to ed. Pt had no further questions and was transported via wheelchair to exit.

## 2020-01-02 NOTE — ED Notes (Signed)
Pt discharged to home. Discharge instructions have been discussed with patient and/or family members. Pt verbally acknowledges understanding d/c instructions, and endorses comprehension to checkout at registration before leaving.  °
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
## 2020-01-02 NOTE — Discharge Instructions (Addendum)
Continue your medications as previously prescribed.  Follow-up with your neurologist tomorrow morning to arrange a follow-up appointment.

## 2020-01-02 NOTE — ED Triage Notes (Signed)
Pt arrives with c/o mid back pain going into her neck states that she is also having pain in her left ear with "fluttering like my head is under water". Pt recently seen for same at [facility] with "recent spinal tap at [facility]" pt reports "he didn't complete it because I was hurting too bad".

## 2020-01-03 ENCOUNTER — Telehealth: Payer: Self-pay | Admitting: Family Medicine

## 2020-01-03 NOTE — Telephone Encounter (Signed)
This pt had LP at [facility], seeing neurology there.  Will f/u there.   LMVM for pt that did see she was to be attended at [facility].

## 2020-01-03 NOTE — Telephone Encounter (Signed)
Pt called stating that she had a lumbar puncture at the hospital and now she is experiencing bad back pain and headaches and is wanting to be seen soon. She refused soonest appt with NP and also MD. Please advise.

## 2020-01-10 ENCOUNTER — Other Ambulatory Visit: Payer: Self-pay | Admitting: Diagnostic Neuroimaging

## 2020-01-16 NOTE — Progress Notes (Addendum)
Follow Up Note  RE: Kathy Hodge MRN: [id number] DOB: 03/04/1971 Date of Office Visit: 01/17/2020  Referring provider: Jeanmarie Hubert, MD Primary care provider: Jeanmarie Hubert, MD  Chief Complaint: Follow-up and Asthma  History of Present Illness: I had the pleasure of seeing Kathy Hodge for a follow up visit at [facility] on 01/17/2020. She is a 49 y.o. female, who is being followed for asthma, allergic rhinitis, food allergy, heartburn. Her previous allergy office visit was on 09/20/2019 with Dr. Maudie Mercury. Today is a regular follow up visit.  Asthma Currently on Dulera 200 2 puffs twice a day and using albuterol 3 times a month mainly with exertion. Denies ER/urgent care visits or prednisone use since the last visit. Wants to get a nebulizer machine as it helps more than a  Seasonal and perennial allergic rhinitis Currently taking benadryl as needed and Flonase as needed during weather changes which helps.  Anaphylactic shock due to adverse food reaction Currently avoiding peanuts, tree nuts and sesame. No reactions since the last visit.   Heartburn Taking omeprazole daily but still has some issues.   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   Assessment and Plan: Kathy Hodge is a 49 y.o. female with: Asthma, well controlled, moderate persistent Past history - Diagnosed with asthma as a child.  Ex-smoker. 2020 spirometry was normal however it did show 18% improvement in FEV1 post bronchodilator treatment. COVID-19 in November 2020. Interim history - stable. ACT score 25. Used albuterol 3 times per month for exertion related issues.   Today's spirometry was normal.   Daily controller medication(s):Dulera 200 2 puffs twice a day with spacer and rinse mouth afterwards.   Continue Singulair 10mg  daily.  Prior to physical activity:May use albuterol rescue inhaler 2 puffs 5 to 15  minutes prior to strenuous physical activities.  Rescue medications:May use albuterol rescue inhaler 2 puffs or nebulizer every 4 to 6 hours as needed for shortness of breath, chest tightness, coughing, and wheezing. Monitor frequency of use  Nebulizer machine given in office.   Get spirometry at next visit and if clinical doing well will consider stepping down on therapy.   Seasonal and perennial allergic rhinitis Past history - Intermittent rhinitis symptoms for the past 20+ years mainly during seasonal changes. 2020 skin testing was negative to environmental allergy panel. Declined intradermal testing. 2020 immunocap positive to dog and borderline to tree pollen.  Interim history - flares during weather changes.   Continue environmental control measures.   May need to take the below allergy medications on a daily basis during the spring.  May use over the counter antihistamines such as Zyrtec (cetirizine), Claritin (loratadine), Allegra (fexofenadine), or Xyzal (levocetirizine) daily as needed.  May use Flonase 1 spray per nostril 1-2 times a day as needed for nasal congestion.  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  Patient consumes limited sesame and finned fish.  2020 skin testing was positive to tree nuts, fish mix and sesame.  Negative to peanuts. 2020 bloodwork was positive to tree nuts, sesame.   Continue strict avoidance of tree nuts, peanuts, sesame.  For mild symptoms you can take over the counter antihistamines such as Benadryl and monitor symptoms closely. If symptoms worsen or if you have severe symptoms including breathing issues, throat closure, significant swelling, whole body hives, severe diarrhea and vomiting,  lightheadedness then inject epinephrine and seek immediate medical care afterwards.  Food action plan in place.   Heartburn Symptoms still not controlled.   Take  omeprazole 40mg  in the morning. Nothing to eat or drink for 30 minutes.  Vaccine counseling Patient had COVID-19 infection in November 2020 with mild symptoms. She is hesitant about getting the vaccine.  Discussed the risks and benefits.   Patient would feel more comfortable receiving the vaccine in our clinic once we have it available. Will re-address at next visit.   Return in about 4 months (around 05/19/2020).  Meds ordered this encounter  Medications  . mometasone-formoterol (DULERA) 200-5 MCG/ACT AERO    Sig: Inhale 2 puffs into the lungs 2 (two) times daily. with spacer and rinse mouth afterwards.    Dispense:  13 g    Refill:  5  . fluticasone (FLONASE) 50 MCG/ACT nasal spray    Sig: SHAKE LIQUID AND USE 1 SPRAY IN EACH NOSTRIL TWICE DAILY AS NEEDED FOR ALLERGIES OR RHINITIS    Dispense:  16 g    Refill:  5  . montelukast (SINGULAIR) 10 MG tablet    Sig: TAKE 1 TABLET(10 MG) BY MOUTH AT BEDTIME    Dispense:  30 tablet    Refill:  5  . albuterol (PROVENTIL) (2.5 MG/3ML) 0.083% nebulizer solution    Sig: Take 3 mLs (2.5 mg total) by nebulization every 6 (six) hours as needed for wheezing or shortness of breath (coughing fit).    Dispense:  75 mL    Refill:  1   Diagnostics: Spirometry:  Tracings reviewed. Her effort: Good reproducible efforts. FVC: 3.00L FEV1: 2.26L, 81% predicted FEV1/FVC ratio: 75% Interpretation: Spirometry consistent with normal pattern.  Please see scanned spirometry results for details.  Medication List:  Current Outpatient Medications  Medication Sig Dispense Refill  . albuterol (VENTOLIN HFA) 108 (90 Base) MCG/ACT inhaler INHALE 1 TO 2 PUFFS INTO THE LUNGS EVERY 6 HOURS AS NEEDED FOR WHEEZING OR SHORTNESS OF BREATH 8.5 g 0  . mometasone-formoterol (DULERA) 200-5 MCG/ACT AERO Inhale 2 puffs into the lungs 2 (two) times daily. with spacer and rinse mouth afterwards. 13 g 5  . montelukast (SINGULAIR) 10 MG tablet TAKE 1 TABLET(10 MG) BY MOUTH AT  BEDTIME 30 tablet 5  . omeprazole (PRILOSEC) 40 MG capsule TAKE ONE CAPSULE BY MOUTH DAILY 90 capsule 1  . albuterol (PROVENTIL) (2.5 MG/3ML) 0.083% nebulizer solution Take 3 mLs (2.5 mg total) by nebulization every 6 (six) hours as needed for wheezing or shortness of breath (coughing fit). 75 mL 1  . diazepam (VALIUM) 2 MG tablet Take 1 tablet (2 mg total) by mouth 2 (two) times daily. 14 tablet 0  . dicyclomine (BENTYL) 20 MG tablet Take 1 tablet (20 mg total) by mouth 3 (three) times daily. 90 tablet 3  . fluticasone (FLONASE) 50 MCG/ACT nasal spray SHAKE LIQUID AND USE 1 SPRAY IN EACH NOSTRIL TWICE DAILY AS NEEDED FOR ALLERGIES OR RHINITIS 16 g 5  . Galcanezumab-gnlm (EMGALITY) 120 MG/ML SOAJ Inject into the skin.    . hydrocortisone 1 % lotion Apply 1 application topically 2 (two) times daily. 118 mL 0  . hydrOXYzine (ATARAX/VISTARIL) 25 MG tablet Take 1 tablet (25 mg total) by mouth every 8 (eight) hours as needed for itching. (Patient not taking: Reported on 01/17/2020) 20 tablet 0  . lidocaine (LIDODERM) 5 % Place 1 patch onto the skin daily. Remove & Discard patch within 12 hours or as directed by MD  30 patch 0  . methocarbamol (ROBAXIN) 500 MG tablet Take 1 tablet (500 mg total) by mouth 2 (two) times daily. 20 tablet 0  . metroNIDAZOLE (FLAGYL) 500 MG tablet Take 1 tablet (500 mg total) by mouth 2 (two) times daily with a meal. DO NOT CONSUME ALCOHOL WHILE TAKING THIS MEDICATION. 14 tablet 0  . oxyCODONE-acetaminophen (PERCOCET) 10-325 MG tablet Take 1 tablet by mouth every 4 (four) hours as needed for pain. 30 tablet 0  . Rimegepant Sulfate (NURTEC) 75 MG TBDP Take 75 mg by mouth daily as needed (take for abortive therapy of migraine, no more than 1 tablet in 24 hours or 10 per month). (Patient taking differently: Take 75 mg by mouth daily as needed (take for abortive therapy of migraine, no more than 1 tablet in 24 hours or 10 per month). Pt not taking) 10 tablet 11  . Rimegepant Sulfate 75  MG TBDP Nurtec ODT 75 mg disintegrating tablet    . tiZANidine (ZANAFLEX) 4 MG tablet TAKE 1 TABLET BY MOUTH EVERY 6 HOURS AS NEEDED FOR MUSCLE SPASMS 60 tablet 1  . topiramate (TOPAMAX) 100 MG tablet Take 1 tablet (100 mg total) by mouth 2 (two) times daily. 180 tablet 3  . zolpidem (AMBIEN) 10 MG tablet 1 p.o. nightly 30 tablet 2   No current facility-administered medications for this visit.   Allergies: Allergies  Allergen Reactions  . Peanut-Containing Drug Products Anaphylaxis  . Other     ALL NUTS  . Peanut Oil Other (See Comments)   I reviewed her past medical history, social history, family history, and environmental history and no significant changes have been reported from her previous visit.  Review of Systems  Constitutional: Negative for appetite change, chills, fever and unexpected weight change.  HENT: Positive for congestion. Negative for rhinorrhea.   Eyes: Negative for itching.  Respiratory: Negative for cough, chest tightness, shortness of breath and wheezing.   Cardiovascular: Negative for chest pain.  Gastrointestinal: Positive for abdominal pain.  Genitourinary: Negative for difficulty urinating.  Skin: Negative for rash.  Allergic/Immunologic: Positive for environmental allergies and food allergies.  Neurological: Positive for headaches.   Objective: BP 118/70 (BP Location: Left Arm, Patient Position: Sitting, Cuff Size: Large)   Pulse 61   Temp 97.9 F (36.6 C) (Temporal)   Resp 18   Wt 212 lb 3.2 oz (96.3 kg)   SpO2 99%   BMI 31.34 kg/m  Body mass index is 31.34 kg/m. Physical Exam  Constitutional: She is oriented to person, place, and time. She appears well-developed and well-nourished.  HENT:  Head: Normocephalic and atraumatic.  Right Ear: External ear normal.  Left Ear: External ear normal.  Nose: Nose normal.  Mouth/Throat: Oropharynx is clear and moist.  Eyes: Conjunctivae and EOM are normal.  Cardiovascular: Normal rate, regular rhythm  and normal heart sounds. Exam reveals no gallop and no friction rub.  No murmur heard. Pulmonary/Chest: Effort normal and breath sounds normal. She has no wheezes. She has no rales.  Abdominal: Soft.  Musculoskeletal:     Cervical back: Neck supple.  Neurological: She is alert and oriented to person, place, and time.  Skin: Skin is warm. No rash noted.  Psychiatric: She has a normal mood and affect. Her behavior is normal.  Nursing note and vitals reviewed.  Previous notes and tests were reviewed. The plan was reviewed with the patient/family, and all questions/concerned were addressed.  It was my pleasure to see Kathy Hodge today and participate in her  care. Please feel free to contact me with any questions or concerns.  Sincerely,  Rexene Alberts, DO Allergy & Immunology  [facility] office: [phone] [facility] office: [place] office: [phone]

## 2020-01-17 ENCOUNTER — Other Ambulatory Visit: Payer: Self-pay

## 2020-01-17 ENCOUNTER — Ambulatory Visit (INDEPENDENT_AMBULATORY_CARE_PROVIDER_SITE_OTHER): Payer: 59 | Admitting: Allergy

## 2020-01-17 ENCOUNTER — Encounter: Payer: Self-pay | Admitting: Allergy

## 2020-01-17 VITALS — BP 118/70 | HR 61 | Temp 97.9°F | Resp 18 | Wt 212.2 lb

## 2020-01-17 DIAGNOSIS — R12 Heartburn: Secondary | ICD-10-CM | POA: Diagnosis not present

## 2020-01-17 DIAGNOSIS — Z7189 Other specified counseling: Secondary | ICD-10-CM

## 2020-01-17 DIAGNOSIS — J3089 Other allergic rhinitis: Secondary | ICD-10-CM | POA: Diagnosis not present

## 2020-01-17 DIAGNOSIS — J4540 Moderate persistent asthma, uncomplicated: Secondary | ICD-10-CM

## 2020-01-17 DIAGNOSIS — Z7185 Encounter for immunization safety counseling: Secondary | ICD-10-CM

## 2020-01-17 DIAGNOSIS — T7800XD Anaphylactic reaction due to unspecified food, subsequent encounter: Secondary | ICD-10-CM | POA: Diagnosis not present

## 2020-01-17 DIAGNOSIS — J302 Other seasonal allergic rhinitis: Secondary | ICD-10-CM

## 2020-01-17 MED ORDER — ALBUTEROL SULFATE (2.5 MG/3ML) 0.083% IN NEBU
2.5000 mg | INHALATION_SOLUTION | Freq: Four times a day (QID) | RESPIRATORY_TRACT | 1 refills | Status: DC | PRN
Start: 2020-01-17 — End: 2022-02-06

## 2020-01-17 MED ORDER — DULERA 200-5 MCG/ACT IN AERO: 2.0000 | INHALATION_SPRAY | Freq: Two times a day (BID) | RESPIRATORY_TRACT | 5 refills | Status: DC

## 2020-01-17 MED ORDER — MONTELUKAST SODIUM 10 MG PO TABS: ORAL_TABLET | ORAL | 5 refills | Status: DC

## 2020-01-17 MED ORDER — FLUTICASONE PROPIONATE 50 MCG/ACT NA SUSP: NASAL | 5 refills | Status: DC

## 2020-01-17 NOTE — Assessment & Plan Note (Signed)
Symptoms still not controlled.   Take omeprazole 40mg  in the morning. Nothing to eat or drink for 30 minutes.

## 2020-01-17 NOTE — Assessment & Plan Note (Signed)
Patient had COVID-19 infection in November 2020 with mild symptoms. She is hesitant about getting the vaccine.  Discussed the risks and benefits.   Patient would feel more comfortable receiving the vaccine in our clinic once we have it available. Will re-address at next visit.

## 2020-01-17 NOTE — Assessment & Plan Note (Signed)
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  Patient consumes limited sesame and finned fish.  2020 skin testing was positive to tree nuts, fish mix and sesame.  Negative to peanuts. 2020 bloodwork was positive to tree nuts, sesame.   Continue strict avoidance of tree nuts, peanuts, sesame.  For mild symptoms you can take over the counter antihistamines such as Benadryl and monitor symptoms closely. If symptoms worsen or if you have severe symptoms including breathing issues, throat closure, significant swelling, whole body hives, severe diarrhea and vomiting, lightheadedness then inject epinephrine and seek immediate medical care afterwards.  Food action plan in place.

## 2020-01-17 NOTE — Patient Instructions (Addendum)
Asthma  Daily controller medication(s):Dulera 200 2 puffs twice a day with spacer and rinse mouth afterwards.   Continue Singulair 10mg  daily.  Prior to physical activity:May use albuterol rescue inhaler 2 puffs 5 to 15 minutes prior to strenuous physical activities.  Rescue medications:May use albuterol rescue inhaler 2 puffs or nebulizer every 4 to 6 hours as needed for shortness of breath, chest tightness, coughing, and wheezing. Monitor frequency of use Asthma control goals:  Full participation in all desired activities (may need albuterol before activity) Albuterol use two times or less a week on average (not counting use with activity) Cough interfering with sleep two times or less a month Oral steroids no more than once a year No hospitalizations  Other allergic rhinitis 2020 bloodwork positive to dog and borderline to tree pollen.   Continue environmental control measures.   May need to take the below allergy medications on a daily basis during the spring.  May use over the counter antihistamines such as Zyrtec (cetirizine), Claritin (loratadine), Allegra (fexofenadine), or Xyzal (levocetirizine) daily as needed.  May use Flonase 1 spray per nostril 1-2 times a day as needed for nasal congestion.  Food allergy:  2020 bloodwork was positive to tree nuts, sesame.   Continue strict avoidance of peanuts, tree nuts, sesame.  For mild symptoms you can take over the counter antihistamines such as Benadryl and monitor symptoms closely. If symptoms worsen or if you have severe symptoms including breathing issues, throat closure, significant swelling, whole body hives, severe diarrhea and vomiting, lightheadedness then inject epinephrine and seek immediate medical care afterwards.  Food action plan in place.   Heartburn  Take omeprazole 40mg  in the morning. Nothing to eat or drink for 30 minutes.  Follow up in 4 months or sooner if needed.   Reducing Pollen  Exposure . Pollen seasons: trees (spring), grass (summer) and ragweed/weeds (fall). Marland Kitchen Keep windows closed in your home and car to lower pollen exposure.  Susa Simmonds air conditioning in the bedroom and throughout the house if possible.  . Avoid going out in dry windy days - especially early morning. . Pollen counts are highest between 5 - 10 AM and on dry, hot and windy days.  . Save outside activities for late afternoon or after a heavy rain, when pollen levels are lower.  . Avoid mowing of grass if you have grass pollen allergy. Marland Kitchen Be aware that pollen can also be transported indoors on people and pets.  . Dry your clothes in an automatic dryer rather than hanging them outside where they might collect pollen.  . Rinse hair and eyes before bedtime. Pet Allergen Avoidance: . Contrary to popular opinion, there are no "hypoallergenic" breeds of dogs or cats. That is because people are not allergic to an animal's hair, but to an allergen found in the animal's saliva, dander (dead skin flakes) or urine. Pet allergy symptoms typically occur within minutes. For some people, symptoms can build up and become most severe 8 to 12 hours after contact with the animal. People with severe allergies can experience reactions in public places if dander has been transported on the pet owners' clothing. Marland Kitchen Keeping an animal outdoors is only a partial solution, since homes with pets in the yard still have higher concentrations of animal allergens. . Before getting a pet, ask your allergist to determine if you are allergic to animals. If your pet is already considered part of your family, try to minimize contact and keep the pet out of  the bedroom and other rooms where you spend a great deal of time. . As with dust mites, vacuum carpets often or replace carpet with a hardwood floor, tile or linoleum. . High-efficiency particulate air (HEPA) cleaners can reduce allergen levels over time. . While dander and saliva are the  source of cat and dog allergens, urine is the source of allergens from rabbits, hamsters, mice and [place] pigs; so ask a non-allergic family member to clean the animal's cage. . If you have a pet allergy, talk to your allergist about the potential for allergy immunotherapy (allergy shots). This strategy can often provide long-term relief.

## 2020-01-17 NOTE — Assessment & Plan Note (Signed)
Past history - Intermittent rhinitis symptoms for the past 20+ years mainly during seasonal changes. 2020 skin testing was negative to environmental allergy panel. Declined intradermal testing. 2020 immunocap positive to dog and borderline to tree pollen.  Interim history - flares during weather changes.   Continue environmental control measures.   May need to take the below allergy medications on a daily basis during the spring.  May use over the counter antihistamines such as Zyrtec (cetirizine), Claritin (loratadine), Allegra (fexofenadine), or Xyzal (levocetirizine) daily as needed.  May use Flonase 1 spray per nostril 1-2 times a day as needed for nasal congestion.

## 2020-01-17 NOTE — Assessment & Plan Note (Addendum)
Past history - Diagnosed with asthma as a child.  Ex-smoker. 2020 spirometry was normal however it did show 18% improvement in FEV1 post bronchodilator treatment. COVID-19 in November 2020. Interim history - stable. ACT score 25. Used albuterol 3 times per month for exertion related issues.   Today's spirometry was normal.   Daily controller medication(s):Dulera 200 2 puffs twice a day with spacer and rinse mouth afterwards.   Continue Singulair 10mg  daily.  Prior to physical activity:May use albuterol rescue inhaler 2 puffs 5 to 15 minutes prior to strenuous physical activities.  Rescue medications:May use albuterol rescue inhaler 2 puffs or nebulizer every 4 to 6 hours as needed for shortness of breath, chest tightness, coughing, and wheezing. Monitor frequency of use  Nebulizer machine given in office.   Get spirometry at next visit and if clinical doing well will consider stepping down on therapy.

## 2020-02-23 ENCOUNTER — Encounter: Payer: Self-pay | Admitting: Family Medicine

## 2020-02-23 ENCOUNTER — Ambulatory Visit (INDEPENDENT_AMBULATORY_CARE_PROVIDER_SITE_OTHER): Payer: 59 | Admitting: Family Medicine

## 2020-02-23 VITALS — BP 124/73 | HR 68 | Ht 69.0 in | Wt 208.0 lb

## 2020-02-23 DIAGNOSIS — G8929 Other chronic pain: Secondary | ICD-10-CM

## 2020-02-23 DIAGNOSIS — G4730 Sleep apnea, unspecified: Secondary | ICD-10-CM | POA: Diagnosis not present

## 2020-02-23 DIAGNOSIS — G43109 Migraine with aura, not intractable, without status migrainosus: Secondary | ICD-10-CM

## 2020-02-23 NOTE — Patient Instructions (Addendum)
Please continue care plan as discussed with Kathy Hodge. Consider Botox  Please consider compliance with CPAP therapy. Headaches will likely continue to worsen if untreated. Talk with your pulmonologist regarding other options for mask refitting or if you are appropriate for a dental device or Inspire device.   Follow up with Kathy Hodge, [place], [place] [address]  Phone: [phone]  Fax: [phone]  Migraine Headache A migraine headache is a very strong throbbing pain on one side or both sides of your head. This type of headache can also cause other symptoms. It can last from 4 hours to 3 days. Talk with your doctor about what things may bring on (trigger) this condition. What are the causes? The exact cause of this condition is not known. This condition may be triggered or caused by:  Drinking alcohol.  Smoking.  Taking medicines, such as: ? Medicine used to treat chest pain (nitroglycerin). ? Birth control pills. ? Estrogen. ? Some blood pressure medicines.  Eating or drinking certain products.  Doing physical activity. Other things that may trigger a migraine headache include:  Having a menstrual period.  Pregnancy.  Hunger.  Stress.  Not getting enough sleep or getting too much sleep.  Weather changes.  Tiredness (fatigue). What increases the risk?  Being 82-45 years old.  Being female.  Having a family history of migraine headaches.  Being Caucasian.  Having depression or anxiety.  Being very overweight. What are the signs or symptoms?  A throbbing pain. This pain may: ? Happen in any area of the head, such as on one side or both sides. ? Make it hard to do daily activities. ? Get worse with physical activity. ? Get worse around bright lights or loud noises.  Other symptoms may include: ? Feeling sick to your stomach (nauseous). ? Vomiting. ? Dizziness. ? Being sensitive to  bright lights, loud noises, or smells.  Before you get a migraine headache, you may get warning signs (an aura). An aura may include: ? Seeing flashing lights or having blind spots. ? Seeing bright spots, halos, or zigzag lines. ? Having tunnel vision or blurred vision. ? Having numbness or a tingling feeling. ? Having trouble talking. ? Having weak muscles.  Some people have symptoms after a migraine headache (postdromal phase), such as: ? Tiredness. ? Trouble thinking (concentrating). How is this treated?  Taking medicines that: ? Relieve pain. ? Relieve the feeling of being sick to your stomach. ? Prevent migraine headaches.  Treatment may also include: ? Having acupuncture. ? Avoiding foods that bring on migraine headaches. ? Learning ways to control your body functions (biofeedback). ? Therapy to help you know and deal with negative thoughts (cognitive behavioral therapy). Follow these instructions at home: Medicines  Take over-the-counter and prescription medicines only as told by your doctor.  Ask your doctor if the medicine prescribed to you: ? Requires you to avoid driving or using heavy machinery. ? Can cause trouble pooping (constipation). You may need to take these steps to prevent or treat trouble pooping:  Drink enough fluid to keep your pee (urine) pale yellow.  Take over-the-counter or prescription medicines.  Eat foods that are high in fiber. These include beans, whole grains, and fresh fruits and vegetables.  Limit foods that are high in fat and sugar. These include fried or sweet foods. Lifestyle  Do not drink alcohol.  Do not use any products that contain nicotine or tobacco, such  as cigarettes, e-cigarettes, and chewing tobacco. If you need help quitting, ask your doctor.  Get at least 8 hours of sleep every night.  Limit and deal with stress. General instructions      Keep a journal to find out what may bring on your migraine headaches. For  example, write down: ? What you eat and drink. ? How much sleep you get. ? Any change in what you eat or drink. ? Any change in your medicines.  If you have a migraine headache: ? Avoid things that make your symptoms worse, such as bright lights. ? It may help to lie down in a dark, quiet room. ? Do not drive or use heavy machinery. ? Ask your doctor what activities are safe for you.  Keep all follow-up visits as told by your doctor. This is important. Contact a doctor if:  You get a migraine headache that is different or worse than others you have had.  You have more than 15 headache days in one month. Get help right away if:  Your migraine headache gets very bad.  Your migraine headache lasts longer than 72 hours.  You have a fever.  You have a stiff neck.  You have trouble seeing.  Your muscles feel weak or like you cannot control them.  You start to lose your balance a lot.  You start to have trouble walking.  You pass out (faint).  You have a seizure. Summary  A migraine headache is a very strong throbbing pain on one side or both sides of your head. These headaches can also cause other symptoms.  This condition may be treated with medicines and changes to your lifestyle.  Keep a journal to find out what may bring on your migraine headaches.  Contact a doctor if you get a migraine headache that is different or worse than others you have had.  Contact your doctor if you have more than 15 headache days in a month. This information is not intended to replace advice given to you by your health care provider. Make sure you discuss any questions you have with your health care provider. Document Revised: 12/11/2018 Document Reviewed: 10/01/2018 Elsevier Patient Education  [place].

## 2020-02-23 NOTE — Progress Notes (Signed)
I reviewed note and agree with plan.   Penni Bombard, MD 2/48/2500, 3:70 PM Certified in Neurology, Neurophysiology and Neuroimaging  [facility] [address] [place], [place] [address] [phone]
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
## 2020-02-25 ENCOUNTER — Emergency Department (HOSPITAL_COMMUNITY)
Admission: EM | Admit: 2020-02-25 | Discharge: 2020-02-25 | Disposition: A | Discharge status: Home / Self Care | Payer: 59 | Attending: Emergency Medicine | Admitting: Emergency Medicine

## 2020-02-25 ENCOUNTER — Emergency Department (HOSPITAL_COMMUNITY): Payer: 59

## 2020-02-25 ENCOUNTER — Encounter (HOSPITAL_COMMUNITY): Payer: Self-pay

## 2020-02-25 DIAGNOSIS — R197 Diarrhea, unspecified: Secondary | ICD-10-CM | POA: Insufficient documentation

## 2020-02-25 DIAGNOSIS — Z87891 Personal history of nicotine dependence: Secondary | ICD-10-CM | POA: Diagnosis not present

## 2020-02-25 DIAGNOSIS — E860 Dehydration: Secondary | ICD-10-CM | POA: Insufficient documentation

## 2020-02-25 DIAGNOSIS — R112 Nausea with vomiting, unspecified: Secondary | ICD-10-CM | POA: Diagnosis present

## 2020-02-25 DIAGNOSIS — R509 Fever, unspecified: Secondary | ICD-10-CM | POA: Diagnosis not present

## 2020-02-25 DIAGNOSIS — R1030 Lower abdominal pain, unspecified: Secondary | ICD-10-CM | POA: Insufficient documentation

## 2020-02-25 DIAGNOSIS — Z91010 Allergy to peanuts: Secondary | ICD-10-CM | POA: Diagnosis not present

## 2020-02-25 DIAGNOSIS — J45909 Unspecified asthma, uncomplicated: Secondary | ICD-10-CM | POA: Insufficient documentation

## 2020-02-25 DIAGNOSIS — Z79899 Other long term (current) drug therapy: Secondary | ICD-10-CM | POA: Insufficient documentation

## 2020-02-25 DIAGNOSIS — I10 Essential (primary) hypertension: Secondary | ICD-10-CM | POA: Insufficient documentation

## 2020-02-25 LAB — CBC WITH DIFFERENTIAL/PLATELET
Abs Immature Granulocytes: 0 10*3/uL (ref 0.00–0.07)
Basophils Absolute: 0 10*3/uL (ref 0.0–0.1)
Basophils Relative: 0 %
Eosinophils Absolute: 0 10*3/uL (ref 0.0–0.5)
Eosinophils Relative: 0 %
HCT: 44.9 % (ref 36.0–46.0)
Hemoglobin: 13.7 g/dL (ref 12.0–15.0)
Lymphocytes Relative: 18 %
Lymphs Abs: 0.7 10*3/uL (ref 0.7–4.0)
MCH: 28 pg (ref 26.0–34.0)
MCHC: 30.5 g/dL (ref 30.0–36.0)
MCV: 91.8 fL (ref 80.0–100.0)
Monocytes Absolute: 0.2 10*3/uL (ref 0.1–1.0)
Monocytes Relative: 4 %
Neutro Abs: 3.2 10*3/uL (ref 1.7–7.7)
Neutrophils Relative %: 78 %
Platelets: 242 10*3/uL (ref 150–400)
RBC: 4.89 MIL/uL (ref 3.87–5.11)
RDW: 13 % (ref 11.5–15.5)
WBC: 4.1 10*3/uL (ref 4.0–10.5)
nRBC: 0 % (ref 0.0–0.2)
nRBC: 0 /100 WBC

## 2020-02-25 LAB — COMPREHENSIVE METABOLIC PANEL
ALT: 18 U/L (ref 0–44)
AST: 19 U/L (ref 15–41)
Albumin: 3.7 g/dL (ref 3.5–5.0)
Alkaline Phosphatase: 66 U/L (ref 38–126)
Anion gap: 12 (ref 5–15)
BUN: 8 mg/dL (ref 6–20)
CO2: 17 mmol/L — ABNORMAL LOW (ref 22–32)
Calcium: 9.2 mg/dL (ref 8.9–10.3)
Chloride: 110 mmol/L (ref 98–111)
Creatinine, Ser: 0.88 mg/dL (ref 0.44–1.00)
GFR calc Af Amer: >60 mL/min (ref >60)
GFR calc non Af Amer: >60 mL/min (ref >60)
Glucose, Bld: 89 mg/dL (ref 70–99)
Potassium: 3.8 mmol/L (ref 3.5–5.1)
Sodium: 139 mmol/L (ref 135–145)
Total Bilirubin: 0.7 mg/dL (ref 0.3–1.2)
Total Protein: 6.9 g/dL (ref 6.5–8.1)

## 2020-02-25 LAB — URINALYSIS, ROUTINE W REFLEX MICROSCOPIC
Bilirubin Urine: NEGATIVE
Glucose, UA: NEGATIVE mg/dL
Hgb urine dipstick: NEGATIVE
Ketones, ur: NEGATIVE mg/dL
Leukocytes,Ua: NEGATIVE
Nitrite: NEGATIVE
Protein, ur: NEGATIVE mg/dL
Specific Gravity, Urine: >1.046 — ABNORMAL HIGH (ref 1.005–1.030)
pH: 5 (ref 5.0–8.0)

## 2020-02-25 LAB — POC URINE PREG, ED: Preg Test, Ur: NEGATIVE

## 2020-02-25 LAB — LIPASE, BLOOD: Lipase: 24 U/L (ref 11–51)

## 2020-02-25 MED ORDER — ONDANSETRON 4 MG PO TBDP
4.0000 mg | ORAL_TABLET | Freq: Three times a day (TID) | ORAL | 0 refills | Status: DC | PRN
Start: 2020-02-25 — End: 2021-05-02

## 2020-02-25 MED ORDER — METOCLOPRAMIDE HCL 5 MG/ML IJ SOLN: 10.0000 mg | Freq: Once | INTRAMUSCULAR | Status: DC

## 2020-02-25 MED ORDER — IOHEXOL 300 MG/ML  SOLN
100.0000 mL | Freq: Once | INTRAMUSCULAR | Status: AC | PRN
  Administered 2020-02-25: 100 mL via INTRAVENOUS

## 2020-02-25 MED ORDER — FENTANYL CITRATE (PF) 100 MCG/2ML IJ SOLN
50.0000 ug | Freq: Once | INTRAMUSCULAR | Status: AC
  Administered 2020-02-25: 50 ug via INTRAVENOUS
  Filled 2020-02-25: qty 2

## 2020-02-25 MED ORDER — PROMETHAZINE HCL 25 MG/ML IJ SOLN
25.0000 mg | Freq: Once | INTRAMUSCULAR | Status: AC
  Administered 2020-02-25: 25 mg via INTRAMUSCULAR
  Filled 2020-02-25: qty 1

## 2020-02-25 MED ORDER — LACTATED RINGERS IV BOLUS
2000.0000 mL | Freq: Once | INTRAVENOUS | Status: AC
  Administered 2020-02-25: 2000 mL via INTRAVENOUS

## 2020-02-25 MED ORDER — ACETAMINOPHEN 325 MG PO TABS
650.0000 mg | ORAL_TABLET | Freq: Once | ORAL | Status: AC
  Administered 2020-02-25: 650 mg via ORAL
  Filled 2020-02-25: qty 2

## 2020-02-25 MED ORDER — SODIUM CHLORIDE 0.9 % IV BOLUS: 1000.0000 mL | Freq: Once | INTRAVENOUS | Status: DC

## 2020-02-25 NOTE — ED Notes (Signed)
Gave pt one can of soda

## 2020-02-25 NOTE — ED Triage Notes (Signed)
Pt BIB c/o nausea, vomitting, diahrrea x4 days with fever since yesterday. Symptomatic relief with Tylenol (last taken at 0200) and zofran (last taken 0330). Negative COVID yesterday. C/o LUQ pain with radiation, positive psoas sign for EMS.
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
## 2020-04-25 ENCOUNTER — Encounter (HOSPITAL_BASED_OUTPATIENT_CLINIC_OR_DEPARTMENT_OTHER): Payer: Self-pay

## 2020-04-25 ENCOUNTER — Emergency Department (HOSPITAL_BASED_OUTPATIENT_CLINIC_OR_DEPARTMENT_OTHER): Payer: 59

## 2020-04-25 ENCOUNTER — Emergency Department (HOSPITAL_BASED_OUTPATIENT_CLINIC_OR_DEPARTMENT_OTHER)
Admission: EM | Admit: 2020-04-25 | Discharge: 2020-04-25 | Disposition: A | Discharge status: Home / Self Care | Payer: 59 | Attending: Emergency Medicine | Admitting: Emergency Medicine

## 2020-04-25 ENCOUNTER — Other Ambulatory Visit: Payer: Self-pay

## 2020-04-25 DIAGNOSIS — Z91010 Allergy to peanuts: Secondary | ICD-10-CM | POA: Diagnosis not present

## 2020-04-25 DIAGNOSIS — Z79899 Other long term (current) drug therapy: Secondary | ICD-10-CM | POA: Diagnosis not present

## 2020-04-25 DIAGNOSIS — S90122A Contusion of left lesser toe(s) without damage to nail, initial encounter: Secondary | ICD-10-CM | POA: Diagnosis not present

## 2020-04-25 DIAGNOSIS — R001 Bradycardia, unspecified: Secondary | ICD-10-CM | POA: Diagnosis not present

## 2020-04-25 DIAGNOSIS — Y929 Unspecified place or not applicable: Secondary | ICD-10-CM | POA: Insufficient documentation

## 2020-04-25 DIAGNOSIS — J4521 Mild intermittent asthma with (acute) exacerbation: Secondary | ICD-10-CM | POA: Diagnosis not present

## 2020-04-25 DIAGNOSIS — Y999 Unspecified external cause status: Secondary | ICD-10-CM | POA: Insufficient documentation

## 2020-04-25 DIAGNOSIS — W228XXA Striking against or struck by other objects, initial encounter: Secondary | ICD-10-CM | POA: Insufficient documentation

## 2020-04-25 DIAGNOSIS — Y939 Activity, unspecified: Secondary | ICD-10-CM | POA: Diagnosis not present

## 2020-04-25 DIAGNOSIS — S99922A Unspecified injury of left foot, initial encounter: Secondary | ICD-10-CM | POA: Diagnosis present

## 2020-04-25 DIAGNOSIS — R0789 Other chest pain: Secondary | ICD-10-CM | POA: Insufficient documentation

## 2020-04-25 DIAGNOSIS — Z87891 Personal history of nicotine dependence: Secondary | ICD-10-CM | POA: Insufficient documentation

## 2020-04-25 DIAGNOSIS — I10 Essential (primary) hypertension: Secondary | ICD-10-CM | POA: Diagnosis not present

## 2020-04-25 MED ORDER — IPRATROPIUM-ALBUTEROL 0.5-2.5 (3) MG/3ML IN SOLN
3.0000 mL | Freq: Once | RESPIRATORY_TRACT | Status: AC
  Administered 2020-04-25: 3 mL via RESPIRATORY_TRACT
  Filled 2020-04-25: qty 3

## 2020-04-25 MED ORDER — PREDNISONE 10 MG PO TABS
40.0000 mg | ORAL_TABLET | Freq: Every day | ORAL | 0 refills | Status: AC
Start: 2020-04-25 — End: 2020-04-29

## 2020-04-25 MED ORDER — PREDNISONE 50 MG PO TABS
60.0000 mg | ORAL_TABLET | Freq: Once | ORAL | Status: AC
  Administered 2020-04-25: 60 mg via ORAL
  Filled 2020-04-25: qty 1
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
## 2020-04-25 NOTE — ED Triage Notes (Signed)
Pt arrives with reports of CP for a couple of day, states she has used her inhalers and neb treatments with no relief.

## 2020-04-25 NOTE — Discharge Instructions (Addendum)
Recheck with your primary care provider.  Return to ER for new or worsening symptoms. Take prednisone as prescribed and complete the full course. Continue with your regularly prescribed medications, use your albuterol nebulizer every 4-6 hours as needed as prescribed.

## 2020-04-25 NOTE — ED Triage Notes (Signed)
Pt also would like to have her left 4th toe looked at while she is here pt reports hitting it on her bed frame, discoloration, and swelling.

## 2020-05-12 ENCOUNTER — Other Ambulatory Visit: Payer: Self-pay | Admitting: Allergy

## 2020-05-16 ENCOUNTER — Other Ambulatory Visit: Payer: Self-pay | Admitting: Urology

## 2020-05-21 NOTE — Progress Notes (Deleted)
Follow Up Note  RE: Kathy Hodge MRN: [id number] DOB: 23-Nov-1970 Date of Office Visit: 05/22/2020  Referring provider: Jeanmarie Hubert, MD Primary care provider: Lacinda Axon, MD  Chief Complaint: No chief complaint on file.  History of Present Illness: I had the pleasure of seeing Kathy Hodge for a follow up visit at the [facility] on 05/21/2020. She is a 49 y.o. female, who is being followed for asthma, allergic rhinitis, food allergy, heartburn. Her previous allergy office visit was on 01/17/2020 with Dr. Maudie Mercury. Today is a regular follow up visit.  Asthma, well controlled, moderate persistent Past history - Diagnosed with asthma as a child.  Ex-smoker. 2020 spirometry was normal however it did show 18% improvement in FEV1 post bronchodilator treatment. COVID-19 in November 2020. Interim history - stable. ACT score 25. Used albuterol 3 times per month for exertion related issues.   Today's spirometry was normal.   Daily controller medication(s):Dulera 200 2 puffs twice a day with spacer and rinse mouth afterwards.  ? Continue Singulair 10mg  daily.  Prior to physical activity:May use albuterol rescue inhaler 2 puffs 5 to 15 minutes prior to strenuous physical activities.  Rescue medications:May use albuterol rescue inhaler 2 puffs or nebulizer every 4 to 6 hours as needed for shortness of breath, chest tightness, coughing, and wheezing. Monitor frequency of use  Nebulizer machine given in office.   Get spirometry at next visit and if clinical doing well will consider stepping down on therapy.   Seasonal and perennial allergic rhinitis Past history - Intermittent rhinitis symptoms for the past 20+ years mainly during seasonal changes. 2020 skin testing was negative to environmental allergy panel. Declined intradermal testing. 2020 immunocap positive to dog and borderline to tree pollen.  Interim history - flares during weather changes.    Continue environmental control measures.  May need to take the below allergy medications on a daily basis during the spring.  May use over the counter antihistamines such as Zyrtec (cetirizine), Claritin (loratadine), Allegra (fexofenadine), or Xyzal (levocetirizine) daily as needed.  May use Flonase 1 spray per nostril 1-2 times a day as needed for nasal congestion.  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  Patient consumes limited sesame and finned fish.  2020 skin testing was positive to tree nuts, fish mix and sesame.  Negative to peanuts. 2020 bloodwork was positive to tree nuts, sesame.   Continue strict avoidance of tree nuts, peanuts, sesame.  For mild symptoms you can take over the counter antihistamines such as Benadryl and monitor symptoms closely. If symptoms worsen or if you have severe symptoms including breathing issues, throat closure, significant swelling, whole body hives, severe diarrhea and vomiting, lightheadedness then inject epinephrine and seek immediate medical care afterwards.  Food action plan in place.   Heartburn Symptoms still not controlled.   Take omeprazole 40mg  in the morning. Nothing to eat or drink for 30 minutes.  Vaccine counseling Patient had COVID-19 infection in November 2020 with mild symptoms. She is hesitant about getting the vaccine.  Discussed the risks and benefits.   Patient would feel more comfortable receiving the vaccine in our clinic once we have it available. Will re-address at next visit.   04/25/2020 ER visit: 1020 49 year old female with history of asthma presents with complaint of chest tightness, not improving with inhalers and breathing treatments at home.  On exam, she is well-appearing, slightly diminished  lung sounds in the bases, no audible wheezing.  Patient was given DuoNeb and prednisone in the department, feeling much better, lung  sounds improved.  Patient is also here for left fourth toe pain, x-ray is negative for fracture, recommend buddy tape.  Commend recheck with PCP, will give prednisone for the next 4 days.  Return to ER for worsening or concerning symptoms.   [LM]  1222 Chest x-ray unremarkable, EKG with sinus bradycardia, no acute ischemic changes.     Assessment and Plan: Kathy Hodge is a 49 y.o. female with: No problem-specific Assessment & Plan notes found for this encounter.  No follow-ups on file.  No orders of the defined types were placed in this encounter.  Lab Orders  No laboratory test(s) ordered today    Diagnostics: Spirometry:  Tracings reviewed. Her effort: {Blank single:19197::"Good reproducible efforts.","It was hard to get consistent efforts and there is a question as to whether this reflects a maximal maneuver.","Poor effort, data can not be interpreted."} FVC: ***L FEV1: ***L, ***% predicted FEV1/FVC ratio: ***% Interpretation: {Blank single:19197::"Spirometry consistent with mild obstructive disease","Spirometry consistent with moderate obstructive disease","Spirometry consistent with severe obstructive disease","Spirometry consistent with possible restrictive disease","Spirometry consistent with mixed obstructive and restrictive disease","Spirometry uninterpretable due to technique","Spirometry consistent with normal pattern","No overt abnormalities noted given today's efforts"}.  Please see scanned spirometry results for details.  Skin Testing: {Blank single:19197::"Select foods","Environmental allergy panel","Environmental allergy panel and select foods","Food allergy panel","None","Deferred due to recent antihistamines use"}. Positive test to: ***. Negative test to: ***.  Results discussed with patient/family.   Medication List:  Current Outpatient Medications  Medication Sig Dispense Refill  . albuterol (PROVENTIL) (2.5 MG/3ML) 0.083% nebulizer solution Take 3 mLs (2.5 mg total)  by nebulization every 6 (six) hours as needed for wheezing or shortness of breath (coughing fit). 75 mL 1  . albuterol (VENTOLIN HFA) 108 (90 Base) MCG/ACT inhaler INHALE 1 TO 2 PUFFS INTO THE LUNGS EVERY 6 HOURS AS NEEDED FOR WHEEZING OR SHORTNESS OF BREATH (Patient taking differently: Inhale 1-2 puffs into the lungs every 6 (six) hours as needed for wheezing or shortness of breath. ) 8.5 g 0  . Ascorbic Acid (VITAMIN C) 1000 MG tablet Take 1,000 mg by mouth daily.    . cholecalciferol (VITAMIN D3) 25 MCG (1000 UNIT) tablet Take 1,000 Units by mouth daily.    . fluticasone (FLONASE) 50 MCG/ACT nasal spray SHAKE LIQUID AND USE 1 SPRAY IN EACH NOSTRIL TWICE DAILY AS NEEDED FOR ALLERGIES OR RHINITIS 16 g 0  . gabapentin (NEURONTIN) 300 MG capsule Take 300 mg by mouth 3 (three) times daily as needed (pain).    . Galcanezumab-gnlm (EMGALITY) 120 MG/ML SOAJ Inject 120 mg into the skin every 30 (thirty) days.     . hydrocortisone 1 % lotion Apply 1 application topically 2 (two) times daily. (Patient not taking: Reported on 02/25/2020) 118 mL 0  . mometasone-formoterol (DULERA) 200-5 MCG/ACT AERO Inhale 2 puffs into the lungs 2 (two) times daily. with spacer and rinse mouth afterwards. 13 g 5  . montelukast (SINGULAIR) 10 MG tablet TAKE 1 TABLET(10 MG) BY MOUTH AT BEDTIME 30 tablet 0  . omeprazole (PRILOSEC) 40 MG capsule TAKE ONE CAPSULE BY MOUTH DAILY (Patient taking differently: Take 40 mg by mouth daily. ) 90 capsule 1  . ondansetron (ZOFRAN ODT) 4 MG disintegrating tablet Take 1 tablet (4 mg total) by mouth every 8 (eight) hours as needed for nausea or vomiting. 12 tablet 0  . oxyCODONE-acetaminophen (PERCOCET) 10-325 MG tablet  Take 1 tablet by mouth every 4 (four) hours as needed for pain. 30 tablet 0  . tiZANidine (ZANAFLEX) 4 MG tablet TAKE 1 TABLET BY MOUTH EVERY 6 HOURS AS NEEDED FOR MUSCLE SPASMS (Patient taking differently: Take 4 mg by mouth every 6 (six) hours as needed for muscle spasms. TAKE 1  TABLET BY MOUTH EVERY 6 HOURS AS NEEDED FOR MUSCLE SPASMS) 60 tablet 1  . topiramate (TOPAMAX) 100 MG tablet Take 1 tablet (100 mg total) by mouth 2 (two) times daily. 180 tablet 3  . vitamin B-12 (CYANOCOBALAMIN) 1000 MCG tablet Take 1,000 mcg by mouth daily.    Marland Kitchen zolpidem (AMBIEN) 10 MG tablet 1 p.o. nightly (Patient taking differently: Take 10 mg by mouth at bedtime. 1 p.o. nightly) 30 tablet 2   No current facility-administered medications for this visit.   Allergies: Allergies  Allergen Reactions  . Peanut-Containing Drug Products Anaphylaxis  . Other     ALL NUTS  . Peanut Oil Other (See Comments)   I reviewed her past medical history, social history, family history, and environmental history and no significant changes have been reported from her previous visit.  Review of Systems  Constitutional: Negative for appetite change, chills, fever and unexpected weight change.  HENT: Positive for congestion. Negative for rhinorrhea.   Eyes: Negative for itching.  Respiratory: Negative for cough, chest tightness, shortness of breath and wheezing.   Cardiovascular: Negative for chest pain.  Gastrointestinal: Positive for abdominal pain.  Genitourinary: Negative for difficulty urinating.  Skin: Negative for rash.  Allergic/Immunologic: Positive for environmental allergies and food allergies.  Neurological: Positive for headaches.   Objective: There were no vitals taken for this visit. There is no height or weight on file to calculate BMI. Physical Exam Vitals and nursing note reviewed.  Constitutional:      Appearance: She is well-developed.  HENT:     Head: Normocephalic and atraumatic.     Right Ear: External ear normal.     Left Ear: External ear normal.     Nose: Nose normal.     Mouth/Throat:     Mouth: Mucous membranes are moist.     Pharynx: Oropharynx is clear.  Eyes:     Conjunctiva/sclera: Conjunctivae normal.  Cardiovascular:     Rate and Rhythm: Normal rate and  regular rhythm.     Heart sounds: Normal heart sounds. No murmur heard.  No friction rub. No gallop.   Pulmonary:     Effort: Pulmonary effort is normal.     Breath sounds: Normal breath sounds. No wheezing or rales.  Musculoskeletal:     Cervical back: Neck supple.  Skin:    General: Skin is warm.     Findings: No rash.  Neurological:     Mental Status: She is alert and oriented to person, place, and time.  Psychiatric:        Behavior: Behavior normal.    Previous notes and tests were reviewed. The plan was reviewed with the patient/family, and all questions/concerned were addressed.  It was my pleasure to see Ensley today and participate in her care. Please feel free to contact me with any questions or concerns.  Sincerely,  Rexene Alberts, DO Allergy & Immunology  [facility] office: [phone] [facility] office: [place] office: [phone]

## 2020-05-22 ENCOUNTER — Ambulatory Visit: Payer: 59 | Admitting: Allergy

## 2020-05-25 ENCOUNTER — Other Ambulatory Visit: Payer: Self-pay | Admitting: Family Medicine

## 2020-05-25 ENCOUNTER — Other Ambulatory Visit: Payer: Self-pay | Admitting: Pulmonary Disease

## 2020-06-19 ENCOUNTER — Emergency Department (HOSPITAL_BASED_OUTPATIENT_CLINIC_OR_DEPARTMENT_OTHER)
Admission: EM | Admit: 2020-06-19 | Discharge: 2020-06-19 | Disposition: A | Discharge status: Home / Self Care | Payer: 59 | Attending: Emergency Medicine | Admitting: Emergency Medicine

## 2020-06-19 ENCOUNTER — Other Ambulatory Visit: Payer: Self-pay

## 2020-06-19 ENCOUNTER — Encounter (HOSPITAL_BASED_OUTPATIENT_CLINIC_OR_DEPARTMENT_OTHER): Payer: Self-pay | Admitting: Emergency Medicine

## 2020-06-19 DIAGNOSIS — L98411 Non-pressure chronic ulcer of buttock limited to breakdown of skin: Secondary | ICD-10-CM | POA: Diagnosis present

## 2020-06-19 DIAGNOSIS — I10 Essential (primary) hypertension: Secondary | ICD-10-CM | POA: Diagnosis not present

## 2020-06-19 DIAGNOSIS — Z87891 Personal history of nicotine dependence: Secondary | ICD-10-CM | POA: Diagnosis not present

## 2020-06-19 DIAGNOSIS — Z91010 Allergy to peanuts: Secondary | ICD-10-CM | POA: Insufficient documentation

## 2020-06-19 DIAGNOSIS — Z7951 Long term (current) use of inhaled steroids: Secondary | ICD-10-CM | POA: Insufficient documentation

## 2020-06-19 DIAGNOSIS — Z8616 Personal history of COVID-19: Secondary | ICD-10-CM | POA: Diagnosis not present

## 2020-06-19 DIAGNOSIS — J45909 Unspecified asthma, uncomplicated: Secondary | ICD-10-CM | POA: Diagnosis not present

## 2020-06-19 DIAGNOSIS — R238 Other skin changes: Secondary | ICD-10-CM

## 2020-06-19 MED ORDER — BACITRACIN ZINC 500 UNIT/GM EX OINT
1.0000 | TOPICAL_OINTMENT | Freq: Two times a day (BID) | CUTANEOUS | 0 refills | Status: DC
Start: 2020-06-19 — End: 2021-05-02

## 2020-06-19 MED ORDER — CEPHALEXIN 500 MG PO CAPS: 500.0000 mg | ORAL_CAPSULE | Freq: Four times a day (QID) | ORAL | 0 refills | Status: AC

## 2020-06-19 MED ORDER — PREDNISONE 20 MG PO TABS
40.0000 mg | ORAL_TABLET | Freq: Every day | ORAL | 0 refills | Status: AC
Start: 2020-06-19 — End: 2020-06-23

## 2020-06-19 NOTE — Discharge Instructions (Signed)
Take antibiotics as directed. Please take all of your antibiotics until finished.  Apply bacitracin as directed.   Take prednisone as directed.   Return to emergency department for any fever, worsening wound, drainage or any other worsening concerning symptoms.

## 2020-06-19 NOTE — ED Triage Notes (Signed)
Has a rash on her or red spot on her buttocks left cheek x 2 weeks states is itchy and OTC meds not helping
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
## 2020-06-26 ENCOUNTER — Other Ambulatory Visit: Payer: Self-pay

## 2020-06-26 ENCOUNTER — Encounter (HOSPITAL_BASED_OUTPATIENT_CLINIC_OR_DEPARTMENT_OTHER): Payer: Self-pay | Admitting: Urology

## 2020-06-26 NOTE — Progress Notes (Signed)
Spoke w/ via phone for pre-op interview---pt Lab needs dos----   none            Lab results------chest xray 04-25-2020 epic, ekg 04-25-2020 epic COVID test ------06-30-2020 850 Arrive at -------530 am 07-04-2020 NPO after MN NO Solid Food.  Clear liquids from MN until---430 am then npo Medications to take morning of surgery -----albuterol inhaler prn/bring inhaler, albuterol nebulizer, dulera, topirimate, oxycodone prn Diabetic medication -----n/a Patient Special Instructions -----none Pre-Op special Istructions -----none Patient verbalized understanding of instructions that were given at this phone interview. Patient denies shortness of breath, chest pain, fever, cough at this phone interview.

## 2020-06-29 NOTE — H&P (Signed)
I was consulted by the above provider to assess the patient's suprapubic pressure and frequency worse since March. She voids every 1 hour or less and cannot hold it for 2 hours. She voids frequently due to pressure and discomfort in the suprapubic area relieved by voiding. She cannot hold it for 2 hours. She gets up 4-5 times a night. She does take hydrochlorothiazide but has no ankle edema.   The pressure in the suprapubic area it is present daily throughout the day and is mild. It worsens as her bladder fills   She has a reasonable flow and feels empty   Oxybutynin failed and she has no dyspareunia   Patient has suprapubic pressure hourly frequency and significant nocturia specially for her age. My index of suspicion is moderately high that she could have interstitial cystitis.   Patient will return for CT scan and cystoscopy. We talked about a hydrodistention today pending the results. Note sent to above provider   CT scan normal.  normal cysto  We talked about a hydrodistention again.  We decided to proceed with hydrodistention. I think is a good option for pressure and frequency are stable   Today  Patient saw a nurse practitioner April 2021. I had not seen her for a year. She decided not to have hydrodistention since she became fearful. she did not want pelvic floor therapy. She was given Myrbetriq. She was given constipation counseling and had a positive CT scan for this. She has not had a hydrodistention or urodynamics.  Patient going less often day and night but still gets up 4 times a night on Myrbetriq. Still has a discomfort. She wants to proceed with the hydrodistention. I went over the risks again. At 1st she says she did not want to do and then changed her mind. We will try to proceed with the hydrodistention. Clinically not infected today   The patient understands that they she/he could have interstitial cystitis. We talked about the role of cystoscopy/hydrodistension and  instillation in detail. Pros, cons, general surgical and anesthetic risks, and other options including watchful waiting were discussed. Risks were described but not limited to pain, infection, and bleeding. The risk of bladder perforation and management were discussed. The patient understands that it is primarily a diagnostic procedure.      ALLERGIES: None   MEDICATIONS: Albuterol Sulfate Hfa  Migraine Injections  Montelukast Sodium  Oxycodone Hcl  Singulair  Tizanidine Hcl 4 mg capsule  Topiramate  Vitamin C  Vitamin D  Zolpidem Tartrate 10 mg tablet     GU PSH: Cystoscopy - 05/06/2019 Locm 300-399Mg /Ml Iodine,1Ml - 05/05/2019     NON-GU PSH: Rotator Cuff Surgery.., Left, 09/2019     GU PMH: Chronic cystitis (w/o hematuria) - 04/15/2019 LLQ pain - 04/15/2019 Nocturia - 04/15/2019 RLQ pain - 04/15/2019 Urinary Frequency - 04/15/2019    NON-GU PMH: No Non-GU PMH    FAMILY HISTORY: No Family History    SOCIAL HISTORY: Marital Status: Single Preferred Language: English; Ethnicity: Not Hispanic Or Latino; Race: Black or African American    REVIEW OF SYSTEMS:    GU Review Female:   Patient reports frequent urination, get up at night to urinate, and leakage of urine. Patient denies hard to postpone urination, burning /pain with urination, stream starts and stops, trouble starting your stream, have to strain to urinate, and being pregnant.  Gastrointestinal (Upper):   Patient denies nausea, vomiting, and indigestion/ heartburn.  Gastrointestinal (Lower):   Patient denies diarrhea and constipation.  Constitutional:   Patient denies fever, night sweats, weight loss, and fatigue.  Skin:   Patient denies skin rash/ lesion and itching.  Eyes:   Patient denies blurred vision and double vision.  Ears/ Nose/ Throat:   Patient denies sore throat and sinus problems.  Hematologic/Lymphatic:   Patient denies swollen glands and easy bruising.  Cardiovascular:   Patient denies leg swelling and  chest pains.  Respiratory:   Patient denies cough and shortness of breath.  Endocrine:   Patient denies excessive thirst.  Musculoskeletal:   Patient denies back pain and joint pain.  Neurological:   Patient denies headaches and dizziness.  Psychologic:   Patient denies anxiety and depression.   VITAL SIGNS:      04/28/2020 09:05 AM  Weight 204.5 lb / 92.76 kg  Height 69 in / 175.26 cm  BP 115/64 mmHg  Pulse 71 /min  Temperature 98.0 F / 36.6 C  BMI 30.2 kg/m   PAST DATA REVIEW: None   PROCEDURES:          Urinalysis w/Scope Dipstick Dipstick Cont'd Micro  Color: Yellow Bilirubin: Neg mg/dL WBC/hpf: 0 - 5/hpf  Appearance: Clear Ketones: Neg mg/dL RBC/hpf: NS (Not Seen)  Specific Gravity: 1.030 Blood: Neg ery/uL Bacteria: NS (Not Seen)  pH: 5.5 Protein: Trace mg/dL Cystals: NS (Not Seen)  Glucose: Neg mg/dL Urobilinogen: 1.0 mg/dL Casts: NS (Not Seen)    Nitrites: Neg Trichomonas: Not Present    Leukocyte Esterase: Trace leu/uL Mucous: Not Present      Epithelial Cells: NS (Not Seen)      Yeast: NS (Not Seen)      Sperm: Not Present    Notes: confirmed by ictotest    ASSESSMENT:      ICD-10 Details  1 GU:   Chronic cystitis (w/o hematuria) - N30.20   2   Urinary Frequency - R35.0      After a thorough review of the management options for the patient's condition the patient  elected to proceed with surgical therapy as noted above. We have discussed the potential benefits and risks of the procedure, side effects of the proposed treatment, the likelihood of the patient achieving the goals of the procedure, and any potential problems that might occur during the procedure or recuperation. Informed consent has been obtained.

## 2020-06-30 ENCOUNTER — Other Ambulatory Visit (HOSPITAL_COMMUNITY)
Admission: RE | Admit: 2020-06-30 | Discharge: 2020-06-30 | Disposition: A | Discharge status: Home / Self Care | Payer: 59 | Source: Ambulatory Visit | Attending: Urology | Admitting: Urology

## 2020-06-30 DIAGNOSIS — Z01812 Encounter for preprocedural laboratory examination: Secondary | ICD-10-CM | POA: Diagnosis present

## 2020-06-30 DIAGNOSIS — Z20822 Contact with and (suspected) exposure to covid-19: Secondary | ICD-10-CM | POA: Diagnosis not present

## 2020-06-30 LAB — SARS CORONAVIRUS 2 (TAT 6-24 HRS): SARS Coronavirus 2: NEGATIVE

## 2020-07-04 ENCOUNTER — Ambulatory Visit (HOSPITAL_BASED_OUTPATIENT_CLINIC_OR_DEPARTMENT_OTHER): Payer: 59 | Admitting: Anesthesiology

## 2020-07-04 ENCOUNTER — Encounter (HOSPITAL_BASED_OUTPATIENT_CLINIC_OR_DEPARTMENT_OTHER)
Admission: RE | Disposition: A | Discharge status: Home / Self Care | Payer: Self-pay | Source: Home / Self Care | Attending: Urology

## 2020-07-04 ENCOUNTER — Other Ambulatory Visit: Payer: Self-pay

## 2020-07-04 ENCOUNTER — Encounter (HOSPITAL_BASED_OUTPATIENT_CLINIC_OR_DEPARTMENT_OTHER): Payer: Self-pay | Admitting: Urology

## 2020-07-04 ENCOUNTER — Ambulatory Visit (HOSPITAL_BASED_OUTPATIENT_CLINIC_OR_DEPARTMENT_OTHER)
Admission: RE | Admit: 2020-07-04 | Discharge: 2020-07-04 | Disposition: A | Discharge status: Home / Self Care | Payer: 59 | Attending: Urology | Admitting: Urology

## 2020-07-04 DIAGNOSIS — N3010 Interstitial cystitis (chronic) without hematuria: Secondary | ICD-10-CM | POA: Insufficient documentation

## 2020-07-04 DIAGNOSIS — Z8616 Personal history of COVID-19: Secondary | ICD-10-CM | POA: Insufficient documentation

## 2020-07-04 DIAGNOSIS — N3020 Other chronic cystitis without hematuria: Secondary | ICD-10-CM | POA: Diagnosis present

## 2020-07-04 HISTORY — DX: Gastro-esophageal reflux disease without esophagitis: K21.9

## 2020-07-04 HISTORY — DX: Rash and other nonspecific skin eruption: R21

## 2020-07-04 HISTORY — DX: Personal history of other diseases of the circulatory system: Z86.79

## 2020-07-04 HISTORY — DX: Pelvic and perineal pain: R10.2

## 2020-07-04 HISTORY — PX: CYSTO WITH HYDRODISTENSION: SHX5453

## 2020-07-04 SURGERY — CYSTOSCOPY, WITH BLADDER HYDRODISTENSION
Anesthesia: General

## 2020-07-04 MED ORDER — LACTATED RINGERS IV SOLN: INTRAVENOUS | Status: DC

## 2020-07-04 MED ORDER — BUPIVACAINE HCL 0.5 % IJ SOLN
INTRAMUSCULAR | Status: DC | PRN
  Administered 2020-07-04: 15 mL

## 2020-07-04 MED ORDER — PROPOFOL 10 MG/ML IV BOLUS
INTRAVENOUS | Status: DC | PRN
  Administered 2020-07-04: 180 mg via INTRAVENOUS
  Administered 2020-07-04: 20 mg via INTRAVENOUS

## 2020-07-04 MED ORDER — ACETAMINOPHEN 160 MG/5ML PO SOLN: 325.0000 mg | ORAL | Status: DC | PRN

## 2020-07-04 MED ORDER — FENTANYL CITRATE (PF) 100 MCG/2ML IJ SOLN: 25.0000 ug | INTRAMUSCULAR | Status: DC | PRN

## 2020-07-04 MED ORDER — FENTANYL CITRATE (PF) 100 MCG/2ML IJ SOLN
INTRAMUSCULAR | Status: AC
  Filled 2020-07-04: qty 2

## 2020-07-04 MED ORDER — FENTANYL CITRATE (PF) 100 MCG/2ML IJ SOLN
INTRAMUSCULAR | Status: DC | PRN
  Administered 2020-07-04 (×2): 50 ug via INTRAVENOUS

## 2020-07-04 MED ORDER — PROPOFOL 10 MG/ML IV BOLUS
INTRAVENOUS | Status: AC
  Filled 2020-07-04: qty 40

## 2020-07-04 MED ORDER — OXYCODONE HCL 5 MG/5ML PO SOLN: 5.0000 mg | Freq: Once | ORAL | Status: DC | PRN

## 2020-07-04 MED ORDER — ONDANSETRON HCL 4 MG/2ML IJ SOLN: 4.0000 mg | Freq: Once | INTRAMUSCULAR | Status: DC | PRN

## 2020-07-04 MED ORDER — CIPROFLOXACIN IN D5W 400 MG/200ML IV SOLN
INTRAVENOUS | Status: AC
  Filled 2020-07-04: qty 200

## 2020-07-04 MED ORDER — MIDAZOLAM HCL 2 MG/2ML IJ SOLN
INTRAMUSCULAR | Status: AC
  Filled 2020-07-04: qty 2

## 2020-07-04 MED ORDER — KETOROLAC TROMETHAMINE 30 MG/ML IJ SOLN
INTRAMUSCULAR | Status: AC
  Filled 2020-07-04: qty 1

## 2020-07-04 MED ORDER — ACETAMINOPHEN 325 MG PO TABS: 325.0000 mg | ORAL_TABLET | ORAL | Status: DC | PRN

## 2020-07-04 MED ORDER — STERILE WATER FOR IRRIGATION IR SOLN
Status: DC | PRN
  Administered 2020-07-04: 1500 mL

## 2020-07-04 MED ORDER — CIPROFLOXACIN IN D5W 400 MG/200ML IV SOLN
400.0000 mg | INTRAVENOUS | Status: AC
  Administered 2020-07-04: 400 mg via INTRAVENOUS

## 2020-07-04 MED ORDER — OXYCODONE HCL 5 MG PO TABS: 5.0000 mg | ORAL_TABLET | Freq: Once | ORAL | Status: DC | PRN

## 2020-07-04 MED ORDER — LIDOCAINE 2% (20 MG/ML) 5 ML SYRINGE
INTRAMUSCULAR | Status: AC
  Filled 2020-07-04: qty 5

## 2020-07-04 MED ORDER — PHENAZOPYRIDINE HCL 200 MG PO TABS
ORAL | Status: DC | PRN
  Administered 2020-07-04: 15 mL via INTRAVESICAL

## 2020-07-04 MED ORDER — DEXAMETHASONE SODIUM PHOSPHATE 4 MG/ML IJ SOLN
INTRAMUSCULAR | Status: DC | PRN
  Administered 2020-07-04: 10 mg via INTRAVENOUS

## 2020-07-04 MED ORDER — ONDANSETRON HCL 4 MG/2ML IJ SOLN
INTRAMUSCULAR | Status: DC | PRN
  Administered 2020-07-04: 4 mg via INTRAVENOUS

## 2020-07-04 MED ORDER — KETOROLAC TROMETHAMINE 30 MG/ML IJ SOLN: 30.0000 mg | Freq: Once | INTRAMUSCULAR | Status: DC | PRN

## 2020-07-04 MED ORDER — KETOROLAC TROMETHAMINE 30 MG/ML IJ SOLN
INTRAMUSCULAR | Status: DC | PRN
  Administered 2020-07-04: 30 mg via INTRAVENOUS

## 2020-07-04 MED ORDER — MIDAZOLAM HCL 5 MG/5ML IJ SOLN
INTRAMUSCULAR | Status: DC | PRN
  Administered 2020-07-04: 2 mg via INTRAVENOUS

## 2020-07-04 MED ORDER — MEPERIDINE HCL 25 MG/ML IJ SOLN: 6.2500 mg | INTRAMUSCULAR | Status: DC | PRN

## 2020-07-04 MED ORDER — ONDANSETRON HCL 4 MG/2ML IJ SOLN
INTRAMUSCULAR | Status: AC
  Filled 2020-07-04: qty 2

## 2020-07-04 MED ORDER — LIDOCAINE HCL (CARDIAC) PF 100 MG/5ML IV SOSY
PREFILLED_SYRINGE | INTRAVENOUS | Status: DC | PRN
  Administered 2020-07-04: 100 mg via INTRAVENOUS

## 2020-07-04 MED ORDER — HYDROCODONE-ACETAMINOPHEN 5-325 MG PO TABS: 1.0000 | ORAL_TABLET | Freq: Four times a day (QID) | ORAL | 0 refills | Status: DC | PRN

## 2020-07-04 MED ORDER — DEXAMETHASONE SODIUM PHOSPHATE 10 MG/ML IJ SOLN
INTRAMUSCULAR | Status: AC
  Filled 2020-07-04: qty 1

## 2020-07-04 MED ORDER — HYDROCODONE-ACETAMINOPHEN 5-325 MG PO TABS
1.0000 | ORAL_TABLET | Freq: Four times a day (QID) | ORAL | 0 refills | Status: DC | PRN
Start: 2020-07-04 — End: 2021-05-02

## 2020-07-04 SURGICAL SUPPLY — 21 items
BAG DRAIN URO-CYSTO SKYTR STRL (DRAIN) ×3 IMPLANT
CATH FOLEY 2WAY SLVR  5CC 18FR (CATHETERS)
CATH FOLEY 2WAY SLVR 5CC 18FR (CATHETERS) IMPLANT
CATH ROBINSON RED A/P 12FR (CATHETERS) IMPLANT
CATH ROBINSON RED A/P 14FR (CATHETERS) ×3 IMPLANT
CLOTH BEACON ORANGE TIMEOUT ST (SAFETY) ×3 IMPLANT
ELECT REM PT RETURN 9FT ADLT (ELECTROSURGICAL)
ELECTRODE REM PT RTRN 9FT ADLT (ELECTROSURGICAL) IMPLANT
GLOVE BIO SURGEON STRL SZ7.5 (GLOVE) ×6 IMPLANT
GOWN STRL REUS W/ TWL XL LVL3 (GOWN DISPOSABLE) ×1 IMPLANT
GOWN STRL REUS W/TWL XL LVL3 (GOWN DISPOSABLE) ×3
KIT TURNOVER CYSTO (KITS) ×3 IMPLANT
MANIFOLD NEPTUNE II (INSTRUMENTS) ×3 IMPLANT
NDL SAFETY ECLIPSE 18X1.5 (NEEDLE) ×1 IMPLANT
NEEDLE HYPO 18GX1.5 SHARP (NEEDLE) ×3
PACK CYSTO (CUSTOM PROCEDURE TRAY) ×3 IMPLANT
SUT SILK 0 TIES 10X30 (SUTURE) IMPLANT
SYR 20ML LL LF (SYRINGE) ×3 IMPLANT
TUBE CONNECTING 12'X1/4 (SUCTIONS) ×1
TUBE CONNECTING 12X1/4 (SUCTIONS) ×2 IMPLANT
WATER STERILE IRR 3000ML UROMA (IV SOLUTION) ×6 IMPLANT

## 2020-07-04 NOTE — Discharge Instructions (Signed)
CYSTOSCOPY HOME CARE INSTRUCTIONS  Activity: Rest for the remainder of the day.  Do not drive or operate equipment today.  You may resume normal activities in one to two days as instructed by your physician.   Meals: Drink plenty of liquids and eat light foods such as gelatin or soup this evening.  You may return to a normal meal plan tomorrow.  Return to Work: You may return to work in one to two days or as instructed by your physician.  Special Instructions / Symptoms: Call your physician if any of these symptoms occur:   -persistent or heavy bleeding  -bleeding which continues after first few urination  -large blood clots that are difficult to pass  -urine stream diminishes or stops completely  -fever equal to or higher than 101 degrees Farenheit.  -cloudy urine with a strong, foul odor  -severe pain  Females should always wipe from front to back after elimination.  You may feel some burning pain when you urinate.  This should disappear with time.  Applying moist heat to the lower abdomen or a hot tub bath may help relieve the pain.    Post Anesthesia Home Care Instructions  Activity: Get plenty of rest for the remainder of the day. A responsible individual must stay with you for 24 hours following the procedure.  For the next 24 hours, DO NOT: -Drive a car -Paediatric nurse -Drink alcoholic beverages -Take any medication unless instructed by your physician -Make any legal decisions or sign important papers.  Meals: Start with liquid foods such as gelatin or soup. Progress to regular foods as tolerated. Avoid greasy, spicy, heavy foods. If nausea and/or vomiting occur, drink only clear liquids until the nausea and/or vomiting subsides. Call your physician if vomiting continues.  Special Instructions/Symptoms: Your throat may feel dry or sore from the anesthesia or the breathing tube placed in your throat during surgery. If this causes discomfort, gargle with warm salt  water. The discomfort should disappear within 24 hours.  May take Ibuprofen, Advil, Motrin, or Aleve after 2 PM as needed for pain.

## 2020-07-04 NOTE — Anesthesia Procedure Notes (Signed)
Procedure Name: LMA Insertion Date/Time: 07/04/2020 7:43 AM Performed by: Justice Rocher, CRNA Pre-anesthesia Checklist: Patient identified, Emergency Drugs available, Suction available, Patient being monitored and Timeout performed Patient Re-evaluated:Patient Re-evaluated prior to induction Oxygen Delivery Method: Circle system utilized Preoxygenation: Pre-oxygenation with 100% oxygen Induction Type: IV induction Ventilation: Mask ventilation without difficulty LMA: LMA inserted LMA Size: 4.0 Number of attempts: 1 Airway Equipment and Method: Bite block Placement Confirmation: positive ETCO2,  breath sounds checked- equal and bilateral and CO2 detector Tube secured with: Tape Dental Injury: Teeth and Oropharynx as per pre-operative assessment

## 2020-07-04 NOTE — Op Note (Signed)
Preoperative diagnosis: Chronic cystitis Postoperative diagnosis: Chronic interstitial cystitis Surgery: Cystoscopy bladder hydrodistention and bladder instillation therapy Surgeon: Dr. Nicki Reaper Shelsea Hangartner  Patient has the above diagnosis and consented the above procedure.  23 French cystoscope utilized.  Preoperative antibiotics given.  Extra care was taken leg positioning.  On initial inspection bladder mucosa and trigone were normal.  She was hydrodistended to 800 mL.  Bladder was empty.  She had diffuse petechiae and glomerulations throughout the bladder in keeping with a diagnosis of interstitial cystitis.  No ulcers.  No bladder injury  Bladder is emptied and is a separate procedure 15 cc of 0.5% Marcaine +400 mg of Pyridium was instilled on the bladder with a 14 French red rubber catheter  Patient will be treated as interstitial cystitis per protocol

## 2020-07-04 NOTE — Interval H&P Note (Signed)
History and Physical Interval Note:  07/04/2020 7:18 AM  Kathy Hodge  has presented today for surgery, with the diagnosis of PELVIC PAIN.  The various methods of treatment have been discussed with the patient and family. After consideration of risks, benefits and other options for treatment, the patient has consented to  Procedure(s): CYSTOSCOPY/HYDRODISTENSION INSTILLATION (N/A) as a surgical intervention.  The patient's history has been reviewed, patient examined, no change in status, stable for surgery.  I have reviewed the patient's chart and labs.  Questions were answered to the patient's satisfaction.     Samina Weekes A Keondra Haydu

## 2020-07-04 NOTE — Anesthesia Postprocedure Evaluation (Signed)
Anesthesia Post Note  Patient: Kathy Hodge  Procedure(s) Performed: CYSTOSCOPY/HYDRODISTENSION INSTILLATION (N/A )     Patient location during evaluation: Phase II Anesthesia Type: General Level of consciousness: awake Pain management: pain level controlled Vital Signs Assessment: post-procedure vital signs reviewed and stable Respiratory status: spontaneous breathing Cardiovascular status: stable Postop Assessment: no apparent nausea or vomiting Anesthetic complications: no   No complications documented.  Last Vitals:  Vitals:   07/04/20 0900 07/04/20 0915  BP: (!) 145/78 (!) 160/79  Pulse: (!) 47 (!) 47  Resp: (!) 9 12  Temp:    SpO2: 100% 100%    Last Pain:  Vitals:   07/04/20 0900  TempSrc:   PainSc: 0-No pain   Pain Goal: Patients Stated Pain Goal: 5 (07/04/20 0640)                 Huston Foley

## 2020-07-04 NOTE — Transfer of Care (Signed)
Immediate Anesthesia Transfer of Care Note  Patient: Kathy Hodge  Procedure(s) Performed: Procedure(s) (LRB): CYSTOSCOPY/HYDRODISTENSION INSTILLATION (N/A)  Patient Location: PACU  Anesthesia Type: General  Level of Consciousness: awake, sedated, patient cooperative and responds to stimulation  Airway & Oxygen Therapy: Patient Spontanous Breathing and Patient connected to [place] 02 and soft FM   Post-op Assessment: Report given to PACU RN, Post -op Vital signs reviewed and stable and Patient moving all extremities  Post vital signs: Reviewed and stable  Complications: No apparent anesthesia complications

## 2020-07-04 NOTE — Anesthesia Preprocedure Evaluation (Signed)
Anesthesia Evaluation  Patient identified by MRN, date of birth, ID band Patient awake    Reviewed: Allergy & Precautions, NPO status , Patient's Chart, lab work & pertinent test results  Airway Mallampati: I  TM Distance: >3 FB Neck ROM: Full    Dental no notable dental hx. (+) Teeth Intact, Dental Advisory Given   Pulmonary asthma , sleep apnea , former smoker,  COVID positive 07/08/2019 now negative and asymptomatic   Pulmonary exam normal breath sounds clear to auscultation       Cardiovascular hypertension, negative cardio ROS Normal cardiovascular exam Rhythm:Regular Rate:Normal     Neuro/Psych  Headaches, PSYCHIATRIC DISORDERS Anxiety Depression    GI/Hepatic Neg liver ROS, GERD  Medicated and Controlled,  Endo/Other  negative endocrine ROS  Renal/GU negative Renal ROS  negative genitourinary   Musculoskeletal negative musculoskeletal ROS (+)   Abdominal Normal abdominal exam  (+)   Peds  Hematology negative hematology ROS (+)   Anesthesia Other Findings   Reproductive/Obstetrics                             Anesthesia Physical  Anesthesia Plan  ASA: II  Anesthesia Plan: General   Post-op Pain Management:  Regional for Post-op pain   Induction: Intravenous  PONV Risk Score and Plan: 4 or greater and Midazolam, Dexamethasone and Ondansetron  Airway Management Planned: Oral ETT  Additional Equipment: None  Intra-op Plan:   Post-operative Plan: Extubation in OR  Informed Consent: I have reviewed the patients History and Physical, chart, labs and discussed the procedure including the risks, benefits and alternatives for the proposed anesthesia with the patient or authorized representative who has indicated his/her understanding and acceptance.     Dental advisory given  Plan Discussed with: CRNA  Anesthesia Plan Comments:         Anesthesia Quick Evaluation

## 2020-07-05 ENCOUNTER — Encounter (HOSPITAL_BASED_OUTPATIENT_CLINIC_OR_DEPARTMENT_OTHER): Payer: Self-pay | Admitting: Urology

## 2020-08-23 IMAGING — CT CT ABD-PELV W/ CM
2 of 5 series · 17 of 46 positions shown, 19 images · IV contrast (Omnipaque)
Comparison: CT 05/05/2019

CLINICAL DATA: Two months of upper abdominal pain 20 pound weight
loss

EXAM:
CT ABDOMEN AND PELVIS WITH CONTRAST
TECHNIQUE: Multidetector CT imaging of the abdomen and pelvis was performed
using the standard protocol following bolus administration of
intravenous contrast.
CONTRAST:  100mL OMNIPAQUE IOHEXOL 300 MG/ML  SOLN

[Series 2: axial st · axial · 0.98mm/px · z∈[-494,-44]mm · 14 of 100 slices shown, 16 images]
[im 5/100  soft-tissue]
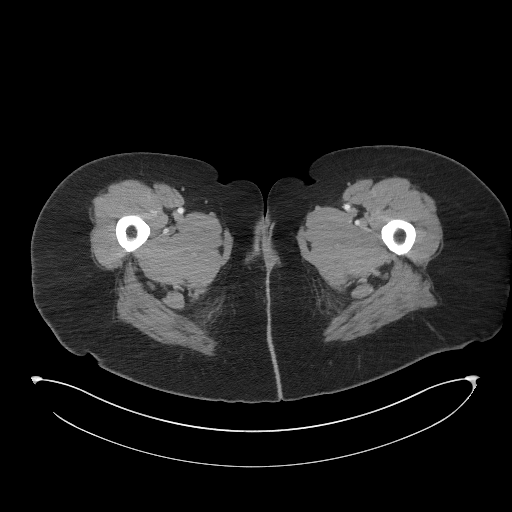
[im 5/100  bone]
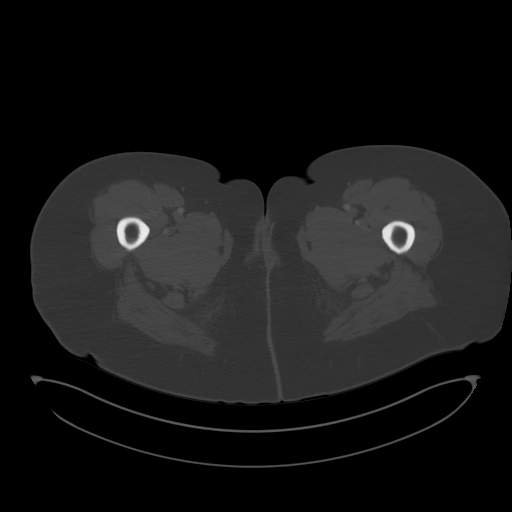
[im 15/100  soft-tissue]
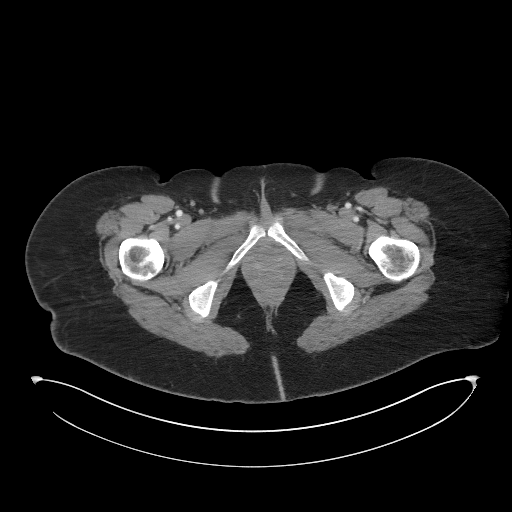
[im 19/100  soft-tissue]
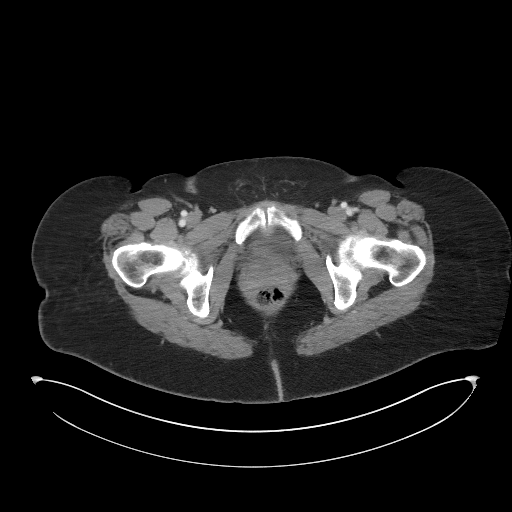
[im 29/100  soft-tissue]
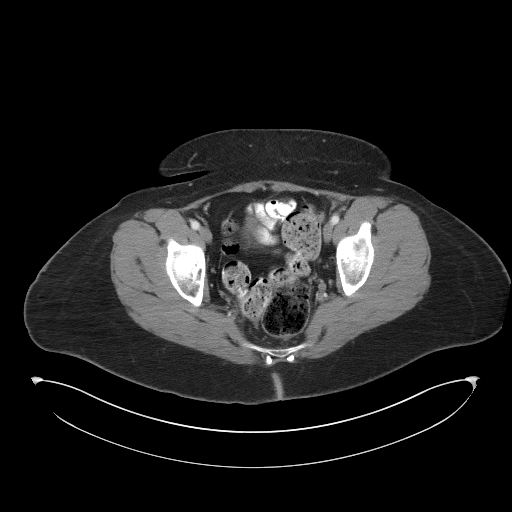
[im 34/100  soft-tissue]
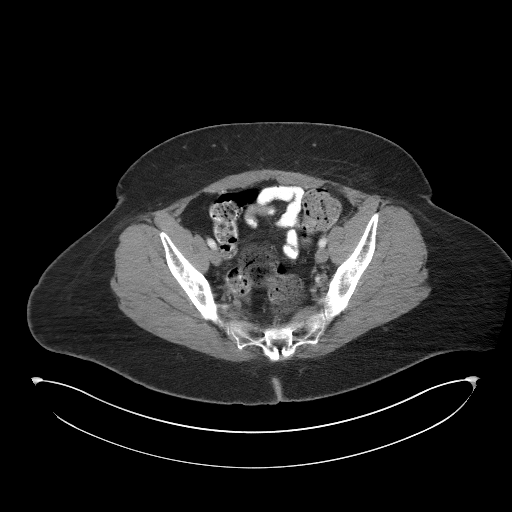
[im 38/100  soft-tissue]
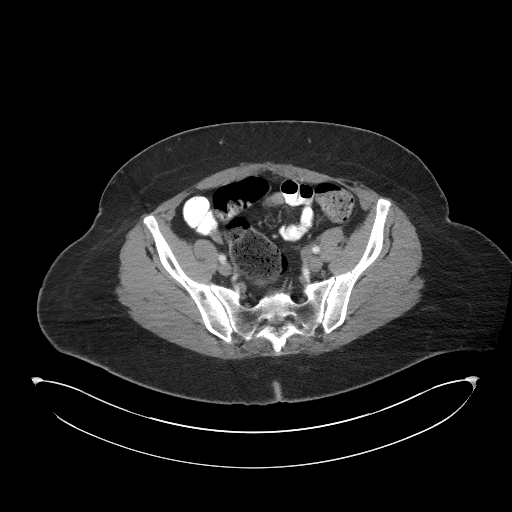
[im 48/100  soft-tissue]
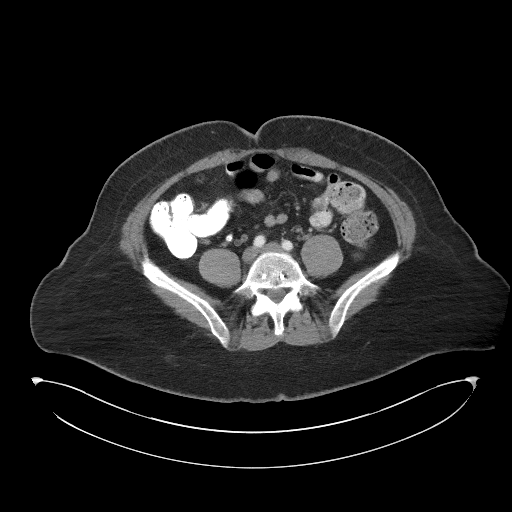
[im 52/100  soft-tissue]
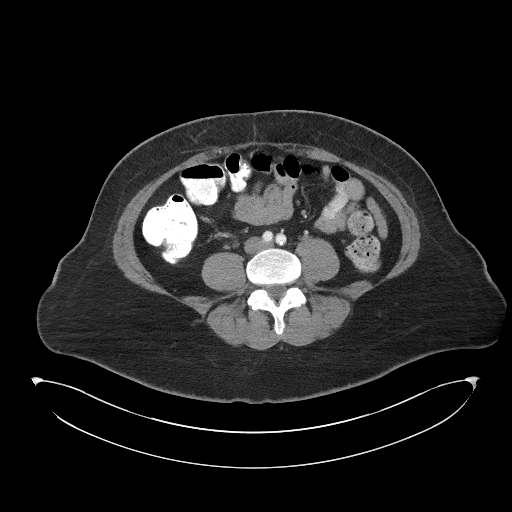
[im 62/100  soft-tissue]
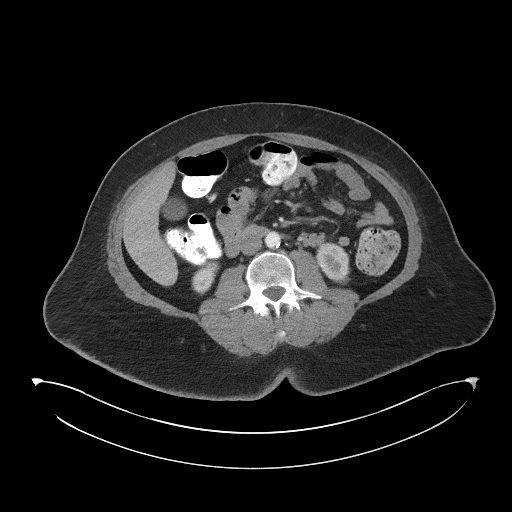
[im 62/100  bone]
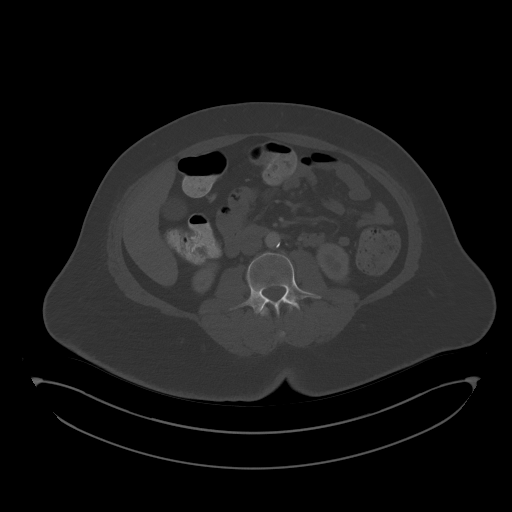
[im 67/100  soft-tissue]
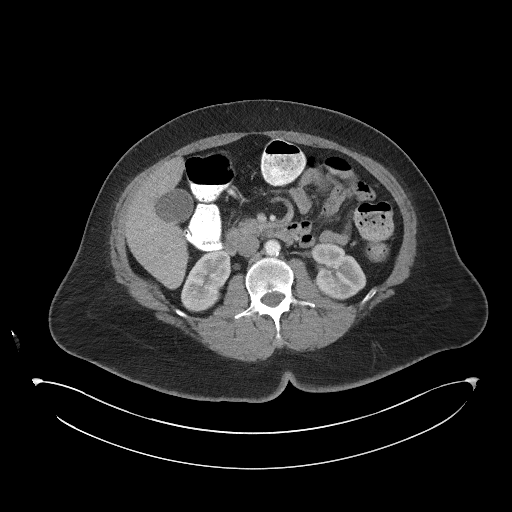
[im 76/100  soft-tissue]
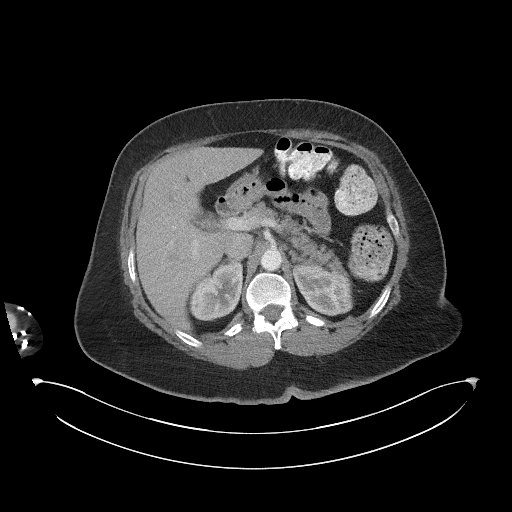
[im 81/100  soft-tissue]
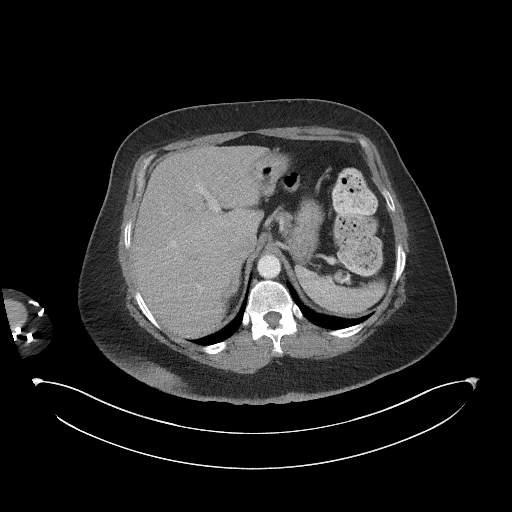
[im 85/100  soft-tissue]
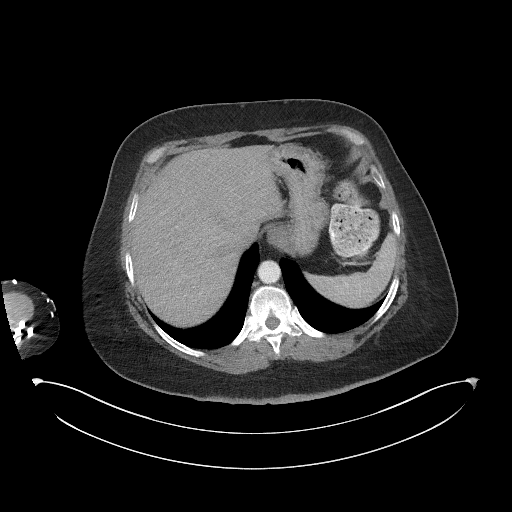
[im 95/100  soft-tissue]
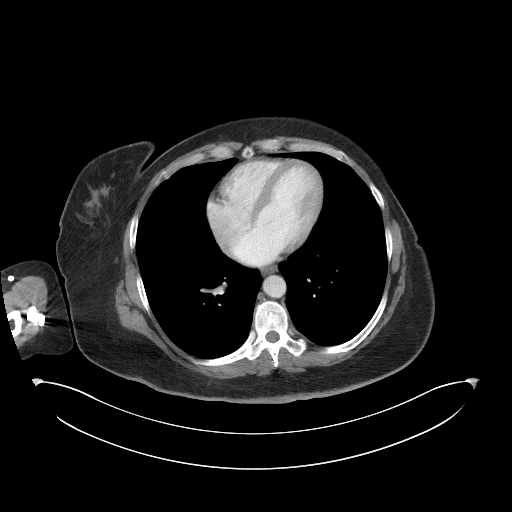

[Series 9: coronal st · coronal · 0.96mm/px · 3 of 100 slices shown]
[im 34/100  soft-tissue]
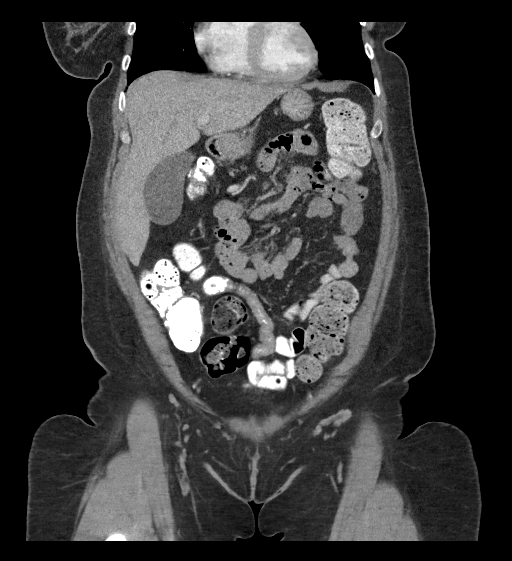
[im 45/100  soft-tissue]
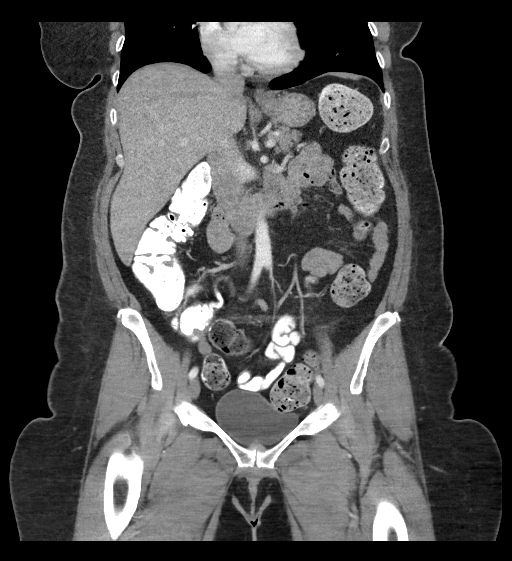
[im 56/100  soft-tissue]
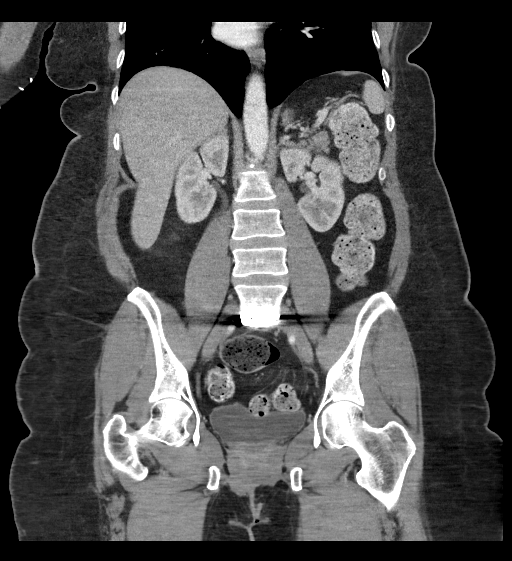

[17 of 46 positions shown; findings below may reference images not displayed]

FINDINGS: Lower chest: No acute abnormality.

Hepatobiliary: No focal liver abnormality is seen. No gallstones,
gallbladder wall thickening, or biliary dilatation.

Pancreas: Unremarkable. No pancreatic ductal dilatation or
surrounding inflammatory changes.

Spleen: Normal in size without focal abnormality.

Adrenals/Urinary Tract: Adrenal glands are unremarkable. Kidneys are
normal, without renal calculi, focal lesion, or hydronephrosis.
Bladder is unremarkable.

Stomach/Bowel: Stomach is within normal limits. Appendix appears
normal. No evidence of bowel wall thickening, distention, or
inflammatory changes. Large stool burden.

Vascular/Lymphatic: Mild aortic atherosclerosis. No aneurysm. No
suspicious adenopathy

Reproductive: Status post hysterectomy. No adnexal masses.

Other: Negative for free air or free fluid

Musculoskeletal: Postsurgical changes at L5-S1.
IMPRESSION: Negative. No CT evidence for acute intra-abdominal or pelvic
abnormality.

## 2020-09-23 IMAGING — MR MR LUMBAR SPINE W/O CM
4 of 5 series · 19 of 48 positions shown · non-contrast
Comparison: CT abdomen pelvis 11/30/2019

CLINICAL DATA: Low back pain.  History of back surgery.

EXAM:
MRI LUMBAR SPINE WITHOUT CONTRAST
TECHNIQUE: Multiplanar, multisequence MR imaging of the lumbar spine was
performed. No intravenous contrast was administered.

[Series 2: T2 · sagittal · 4.0mm · 0.55mm/px · 6 of 14 slices shown (1 of 2)]
[im 1/14]
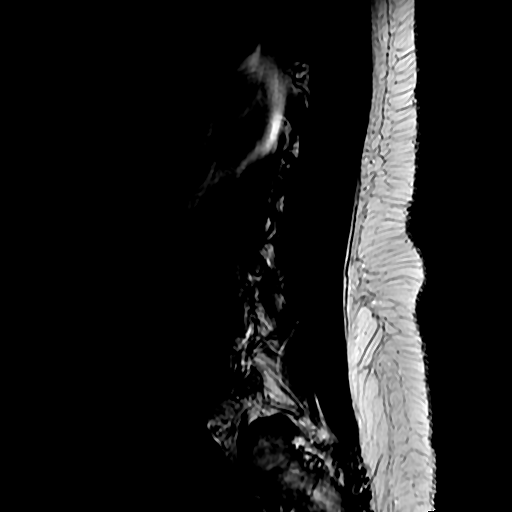
[im 3/14]
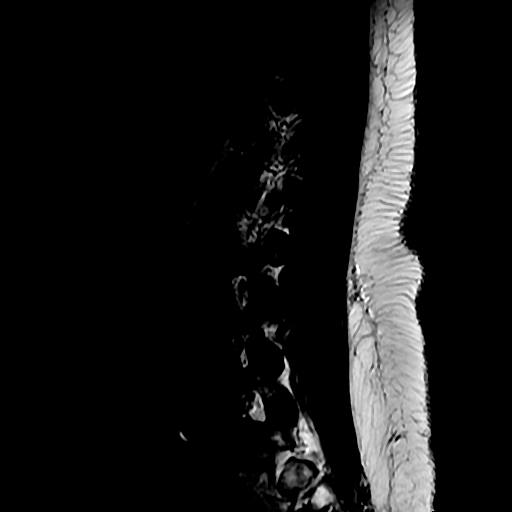
[im 6/14]
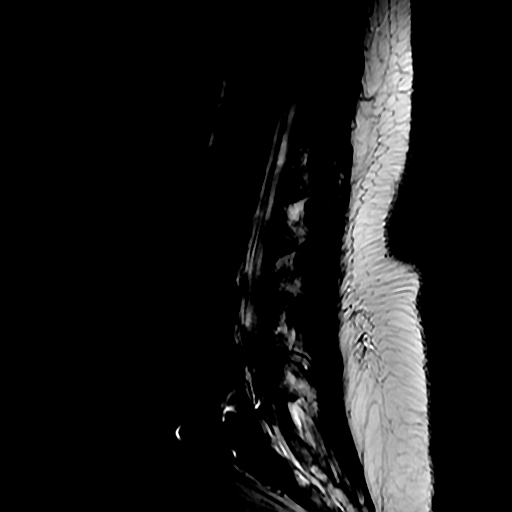
[im 8/14]
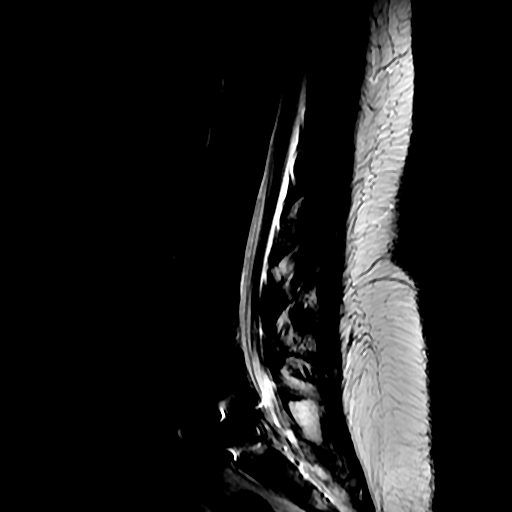
[im 11/14]
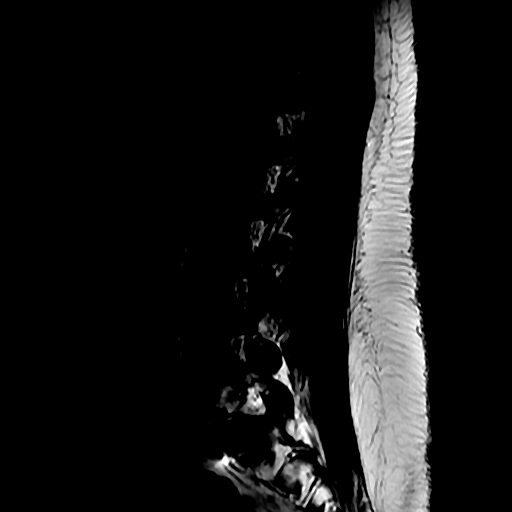
[im 14/14]
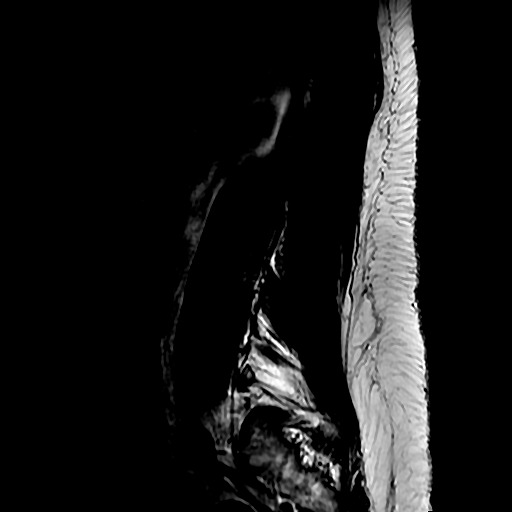

[Series 4: T1 · sagittal · 4.0mm · 0.51mm/px · 3 of 14 slices shown (1 of 2)]
[im 3/14]
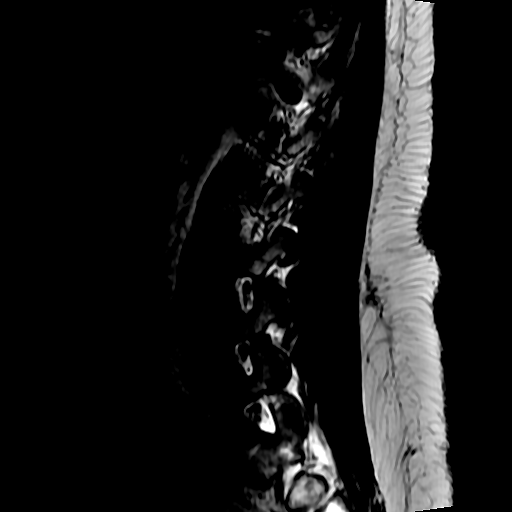
[im 8/14]
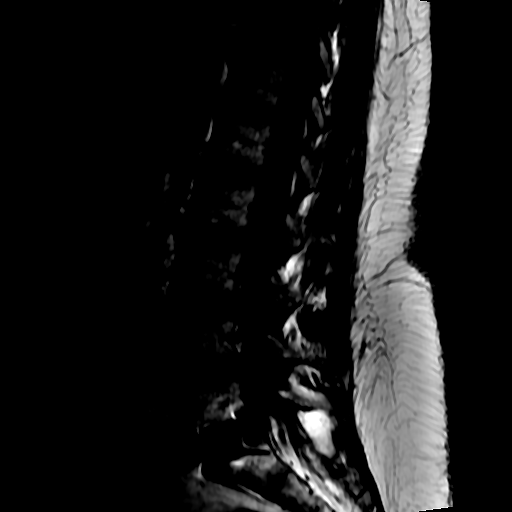
[im 14/14]
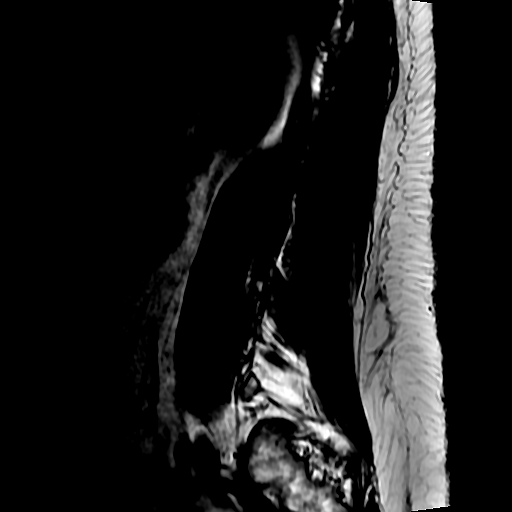

[Series 5: T2 · axial · 4.0mm · 0.39mm/px · z∈[-131,+55]mm · 7 of 38 slices shown (2 of 2)]
[im 1/38]
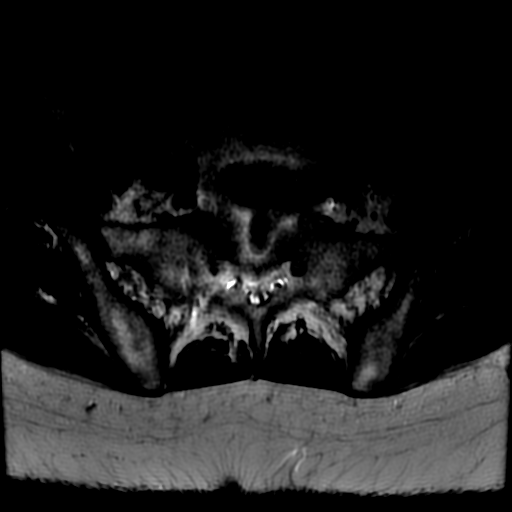
[im 6/38]
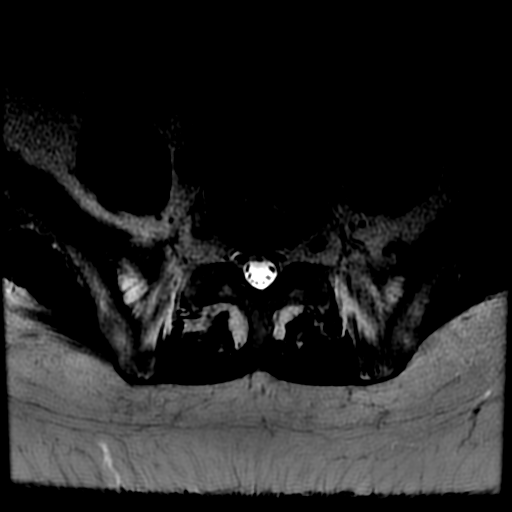
[im 11/38]
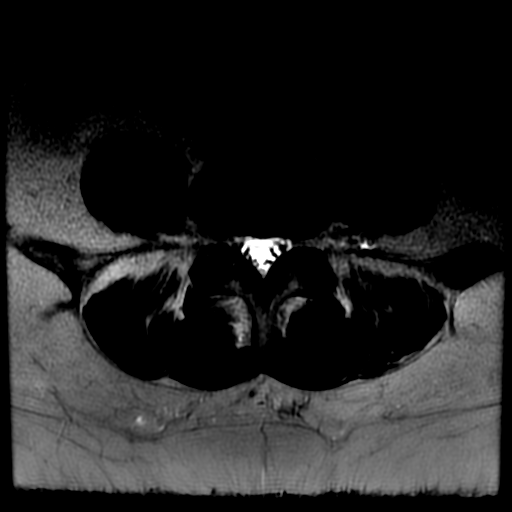
[im 16/38]
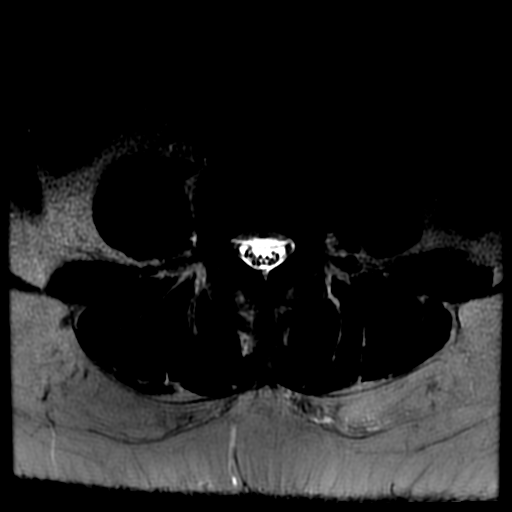
[im 19/38]
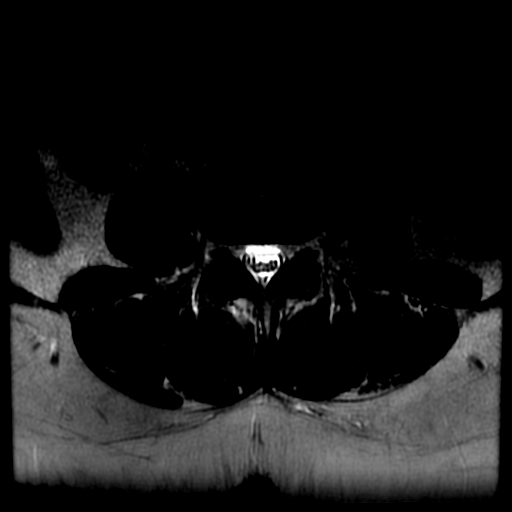
[im 22/38]
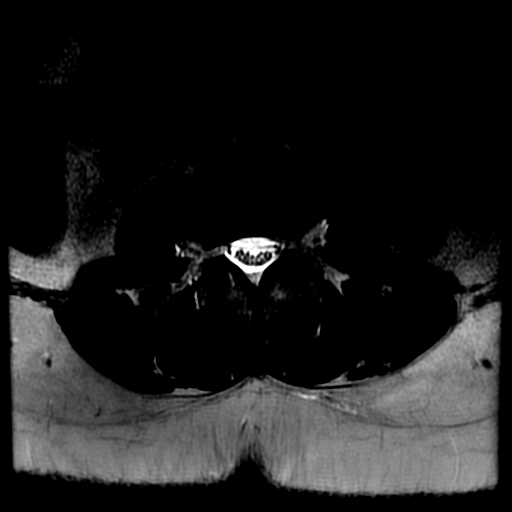
[im 32/38]
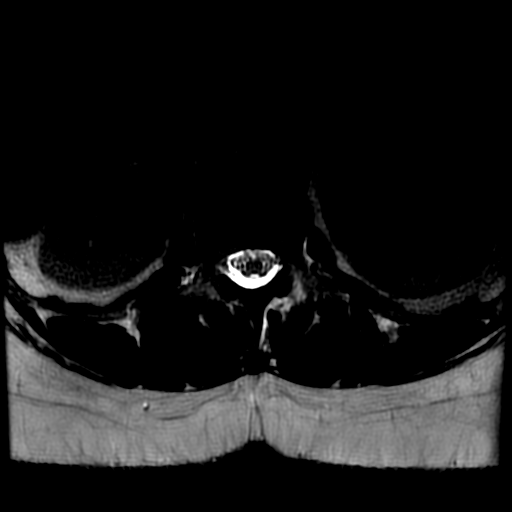

[Series 6: T1 · axial · 4.0mm · 0.39mm/px · z∈[-107,+55]mm · 3 of 38 slices shown (2 of 2)]
[im 6/38]
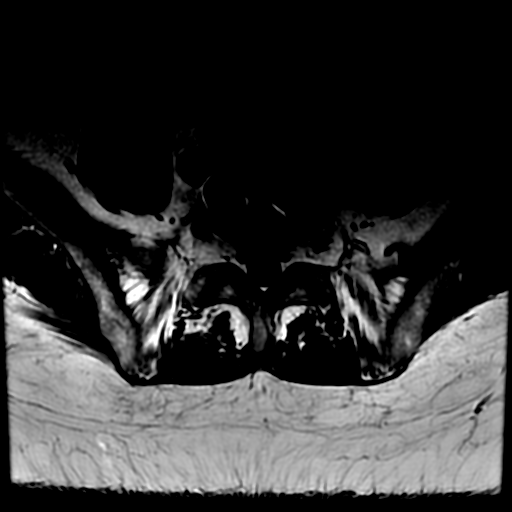
[im 19/38]
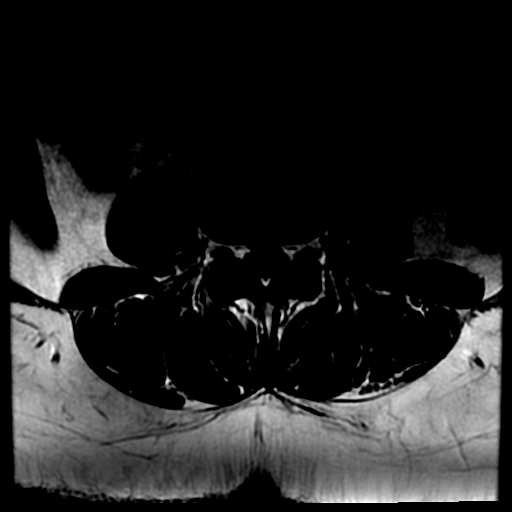
[im 32/38]
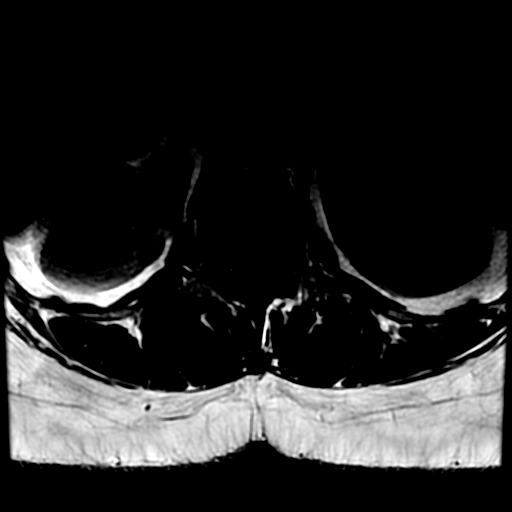

[19 of 48 positions shown; findings below may reference images not displayed]

FINDINGS: Segmentation:  Normal

Alignment:  Normal

Vertebrae: Negative for fracture or mass. Hardware artifact from
interbody fusion L5-S1.

Conus medullaris and cauda equina: Conus extends to the L1-2 level.
Conus and cauda equina appear normal.

Paraspinal and other soft tissues: Negative for paraspinous mass or
adenopathy.

Disc levels:

L1-2: Negative

L2-3: Negative

L3-4: Mild disc bulging and mild facet degeneration. Negative for
stenosis.

L4-5: Mild disc bulging and mild facet degeneration. Negative for
stenosis

L5-S1: Anterior interbody fusion.  No significant stenosis.
IMPRESSION: Mild disc and facet degeneration L3-4 L4-5 without stenosis.

Anterior interbody fusion L5-S1 without stenosis.

## 2020-11-06 ENCOUNTER — Other Ambulatory Visit: Payer: Self-pay | Admitting: Allergy

## 2020-11-06 ENCOUNTER — Telehealth: Payer: Self-pay

## 2020-11-06 NOTE — Telephone Encounter (Signed)
Left message on pt.'s cell number that we sent in a courtesy refill of her dulera 200/5 mcg. No more refills will be sent in until you are seen, please call office to schedule an office visit.

## 2020-11-10 NOTE — Telephone Encounter (Signed)
Left message for pt.to call the [facility] office to schedule an office appointment. We sent in a courtesy refill of the dulera 200  Mcg/5 mcg no more refills until you are seen

## 2020-11-21 ENCOUNTER — Other Ambulatory Visit: Payer: Self-pay

## 2020-11-21 ENCOUNTER — Encounter (HOSPITAL_BASED_OUTPATIENT_CLINIC_OR_DEPARTMENT_OTHER): Payer: Self-pay | Admitting: *Deleted

## 2020-11-21 ENCOUNTER — Emergency Department (HOSPITAL_BASED_OUTPATIENT_CLINIC_OR_DEPARTMENT_OTHER)
Admission: EM | Admit: 2020-11-21 | Discharge: 2020-11-21 | Disposition: A | Discharge status: Home / Self Care | Payer: 59 | Attending: Emergency Medicine | Admitting: Emergency Medicine

## 2020-11-21 ENCOUNTER — Emergency Department (HOSPITAL_BASED_OUTPATIENT_CLINIC_OR_DEPARTMENT_OTHER): Payer: 59

## 2020-11-21 DIAGNOSIS — Z7951 Long term (current) use of inhaled steroids: Secondary | ICD-10-CM | POA: Insufficient documentation

## 2020-11-21 DIAGNOSIS — J4540 Moderate persistent asthma, uncomplicated: Secondary | ICD-10-CM | POA: Insufficient documentation

## 2020-11-21 DIAGNOSIS — Z91010 Allergy to peanuts: Secondary | ICD-10-CM | POA: Insufficient documentation

## 2020-11-21 DIAGNOSIS — R519 Headache, unspecified: Secondary | ICD-10-CM | POA: Insufficient documentation

## 2020-11-21 DIAGNOSIS — Z8669 Personal history of other diseases of the nervous system and sense organs: Secondary | ICD-10-CM | POA: Diagnosis not present

## 2020-11-21 DIAGNOSIS — I10 Essential (primary) hypertension: Secondary | ICD-10-CM | POA: Insufficient documentation

## 2020-11-21 DIAGNOSIS — Z8616 Personal history of COVID-19: Secondary | ICD-10-CM | POA: Insufficient documentation

## 2020-11-21 DIAGNOSIS — Z87891 Personal history of nicotine dependence: Secondary | ICD-10-CM | POA: Insufficient documentation

## 2020-11-21 DIAGNOSIS — H5319 Other subjective visual disturbances: Secondary | ICD-10-CM | POA: Diagnosis not present

## 2020-11-21 DIAGNOSIS — Z79899 Other long term (current) drug therapy: Secondary | ICD-10-CM | POA: Insufficient documentation

## 2020-11-21 LAB — CBC WITH DIFFERENTIAL/PLATELET
Abs Immature Granulocytes: 0 10*3/uL (ref 0.00–0.07)
Basophils Absolute: 0 10*3/uL (ref 0.0–0.1)
Basophils Relative: 1 %
Eosinophils Absolute: 0.1 10*3/uL (ref 0.0–0.5)
Eosinophils Relative: 2 %
HCT: 36.9 % (ref 36.0–46.0)
Hemoglobin: 11.7 g/dL — ABNORMAL LOW (ref 12.0–15.0)
Immature Granulocytes: 0 %
Lymphocytes Relative: 46 %
Lymphs Abs: 1.9 10*3/uL (ref 0.7–4.0)
MCH: 28.1 pg (ref 26.0–34.0)
MCHC: 31.7 g/dL (ref 30.0–36.0)
MCV: 88.7 fL (ref 80.0–100.0)
Monocytes Absolute: 0.4 10*3/uL (ref 0.1–1.0)
Monocytes Relative: 10 %
Neutro Abs: 1.7 10*3/uL (ref 1.7–7.7)
Neutrophils Relative %: 41 %
Platelets: 251 10*3/uL (ref 150–400)
RBC: 4.16 MIL/uL (ref 3.87–5.11)
RDW: 13.2 % (ref 11.5–15.5)
WBC: 4 10*3/uL (ref 4.0–10.5)
nRBC: 0 % (ref 0.0–0.2)

## 2020-11-21 LAB — BASIC METABOLIC PANEL
Anion gap: 8 (ref 5–15)
BUN: 15 mg/dL (ref 6–20)
CO2: 18 mmol/L — ABNORMAL LOW (ref 22–32)
Calcium: 8.3 mg/dL — ABNORMAL LOW (ref 8.9–10.3)
Chloride: 113 mmol/L — ABNORMAL HIGH (ref 98–111)
Creatinine, Ser: 0.91 mg/dL (ref 0.44–1.00)
GFR, Estimated: >60 mL/min (ref >60)
Glucose, Bld: 90 mg/dL (ref 70–99)
Potassium: 3.9 mmol/L (ref 3.5–5.1)
Sodium: 139 mmol/L (ref 135–145)

## 2020-11-21 MED ORDER — PROCHLORPERAZINE EDISYLATE 10 MG/2ML IJ SOLN
10.0000 mg | Freq: Once | INTRAMUSCULAR | Status: AC
  Administered 2020-11-21: 10 mg via INTRAVENOUS
  Filled 2020-11-21: qty 2

## 2020-11-21 MED ORDER — DIPHENHYDRAMINE HCL 50 MG/ML IJ SOLN
50.0000 mg | Freq: Once | INTRAMUSCULAR | Status: AC
  Administered 2020-11-21: 50 mg via INTRAVENOUS
  Filled 2020-11-21: qty 1

## 2020-11-21 MED ORDER — KETOROLAC TROMETHAMINE 30 MG/ML IJ SOLN
30.0000 mg | Freq: Once | INTRAMUSCULAR | Status: AC
  Administered 2020-11-21: 30 mg via INTRAVENOUS
  Filled 2020-11-21: qty 1

## 2020-11-21 MED ORDER — METOCLOPRAMIDE HCL 5 MG/ML IJ SOLN
10.0000 mg | Freq: Once | INTRAMUSCULAR | Status: AC
  Administered 2020-11-21: 10 mg via INTRAVENOUS
  Filled 2020-11-21: qty 2

## 2020-11-21 MED ORDER — DEXAMETHASONE SODIUM PHOSPHATE 4 MG/ML IJ SOLN
4.0000 mg | Freq: Once | INTRAMUSCULAR | Status: AC
  Administered 2020-11-21: 4 mg via INTRAVENOUS
  Filled 2020-11-21: qty 1

## 2020-11-21 MED ORDER — SODIUM CHLORIDE 0.9 % IV BOLUS
1000.0000 mL | Freq: Once | INTRAVENOUS | Status: AC
  Administered 2020-11-21: 1000 mL via INTRAVENOUS

## 2020-11-21 NOTE — Discharge Instructions (Signed)
Continue current meds for migraines.  Please call your neurologist regarding follow-up and medicines for migraines.  Your CT head and your labs were unremarkable today.  Return to the ER if you have worse headaches, vomiting, fevers, neck pain.

## 2020-11-21 NOTE — ED Triage Notes (Signed)
Pt reports headache, sudden onset, around 7pm yesterday. States she has a history of migraine but this pain is different. She has tried her migraine meds without relief. States she also feels dizzy (spinning feeling).
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
## 2020-11-21 NOTE — ED Notes (Signed)
Pt. Reports she has a pain in the R side of her head toward the back side of her head that feels like a stabbing pain.  Pt. Reports she had to leave work due to nausea from the pain and needing a poss. Stent placement her neurologist has told her or suggested.

## 2020-11-21 NOTE — ED Notes (Signed)
Patient transported to CT 

## 2021-05-01 NOTE — Progress Notes (Signed)
Follow Up Note  RE: Kathy Hodge MRN: [id number] DOB: [birth year] Date of Office Visit: 05/02/2021  Referring provider: No ref. provider found Primary care provider: System, Provider Not In  Chief Complaint: Follow-up  History of Present Illness: I had the pleasure of seeing Kathy Hodge for a follow up visit at the [facility] on 05/02/2021. She is a 50 y.o. female, who is being followed for asthma, allergic rhinitis, food allergy, heartburn. Her previous allergy office visit was on 01/17/2020 with Dr. Maudie Mercury. Today is a regular follow up visit.  Asthma Denies ER/urgent care visits or prednisone use since the last visit. Stopped Dulera completely about 6 months ago and did notice that her breathing is not as good but only had to use albuterol during high pollen season, weather changes less than once per month.   Patient had Covid-19 3 times now and is unvaccinated. She had mild symptoms with her infections.  Patient is still taking Singulair '10mg'$  daily.   Needs some refills.   Seasonal and perennial allergic rhinitis Has rhinorrhea and coughing with the weather changes. Not taking any antihistamines. Used Flonase prn only.   Anaphylactic shock due to adverse food reaction Currently avoiding tree nuts, peanuts, sesame. Patient touched pistachios and developed some facial itching.   She was in a restaurant and had fish with sesame. She noted some itchy throat.  Symptoms resolved after awhile. She didn't have benadryl or Epipen on hand at this time. She keeps her Epipen in the car - advised against this and recommended to keep in purse.   Heartburn Taking omeprazole '40mg'$  in the morning with good benefit.   04/25/2020 ER visit: "50 year old female presents with complaint of chest tightness with nonproductive cough for the past few days. Patient has a history of asthma, has been using her daily inhaler without improvement in her symptoms, did try neb  treatment with minimal relief. Denies fevers, chills, sick contacts. Denies nausea, vomiting, diaphoresis. Also reports left 4th toe injury, hit on furniture a few days ago."  Assessment and Plan: Kathy Hodge is a 50 y.o. female with: Asthma, well controlled, moderate persistent Past history - Diagnosed with asthma as a child.  Ex-smoker. 2020 spirometry was normal however it did show 18% improvement in FEV1 post bronchodilator treatment. COVID-19 in November 2020. Interim history - stopped Dulera completely 6 months ago and only requiring albuterol on rare occasions with high pollen count and weather changes. Had Covid-19 3 times now - unvaccinated. Today's spirometry was normal.  For the next 1 week start Dulera 139mg 2 puffs twice a day with spacer and rinse mouth afterwards. Daily controller medication(s): Continue Singulair '10mg'$  daily. During upper respiratory infections/asthma flares: start Dulera 1072m 2 puffs twice a day for 1-2 weeks until your breathing symptoms return to baseline.  May use albuterol rescue inhaler 2 puffs or nebulizer every 4 to 6 hours as needed for shortness of breath, chest tightness, coughing, and wheezing. May use albuterol rescue inhaler 2 puffs 5 to 15 minutes prior to strenuous physical activities. Monitor frequency of use.  Get spirometry at next visit.  Seasonal and perennial allergic rhinitis Past history - Intermittent rhinitis symptoms for the past 20+ years mainly during seasonal changes. 2020 skin testing was negative to environmental allergy panel. Declined intradermal testing. 2020 immunocap positive to dog and borderline to tree pollen.  Interim history - increased rhinitis symptoms during weather changes.  Continue environmental control measures.  May need to take the  below allergy medications on a daily basis during the spring. Use over the counter antihistamines such as Zyrtec (cetirizine), Claritin (loratadine), Allegra (fexofenadine), or Xyzal  (levocetirizine) daily as needed. May take twice a day during allergy flares. May switch antihistamines every few months. Use Flonase (fluticasone) nasal spray 1 spray per nostril twice a day as needed for nasal congestion.  Nasal saline spray (i.e., Simply Saline) or nasal saline lavage (i.e., NeilMed) is recommended as needed and prior to medicated nasal sprays.  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  Patient consumes limited sesame and finned fish.  2020 skin testing was positive to tree nuts, fish mix and sesame.  Negative to peanuts. 2020 bloodwork was positive to tree nuts, sesame.  Interim history - itching with pistachio contact; throat discomfort with sesame ingestion. Continue strict avoidance of peanuts, tree nuts, sesame. For mild symptoms you can take over the counter antihistamines such as Benadryl and monitor symptoms closely. If symptoms worsen or if you have severe symptoms including breathing issues, throat closure, significant swelling, whole body hives, severe diarrhea and vomiting, lightheadedness then inject epinephrine and seek immediate medical care afterwards. Food action plan in place.   Heartburn Stable.  Take omeprazole '40mg'$  in the morning. Nothing to eat or drink for 30 minutes.  Return in about 6 months (around 10/30/2021).  Meds ordered this encounter  Medications   EPINEPHrine 0.3 mg/0.3 mL IJ SOAJ injection    Sig: Inject 0.3 mg into the muscle as needed.    Dispense:  1 each    Refill:  2    May dispense generic/Mylan/Teva brand.   mometasone-formoterol (DULERA) 100-5 MCG/ACT AERO    Sig: Inhale 2 puffs into the lungs in the morning and at bedtime. with spacer and rinse mouth afterwards.    Dispense:  1 each    Refill:  5   montelukast (SINGULAIR) 10 MG tablet    Sig: Take 1 tablet (10 mg total) by mouth at bedtime.    Dispense:  30 tablet    Refill:  5   levocetirizine  (XYZAL) 5 MG tablet    Sig: Take 1 tablet (5 mg total) by mouth every evening.    Dispense:  30 tablet    Refill:  5   fluticasone (FLONASE) 50 MCG/ACT nasal spray    Sig: Place 1 spray into both nostrils 2 (two) times daily as needed (nasal congestion).    Dispense:  16 g    Refill:  5    Lab Orders  No laboratory test(s) ordered today    Diagnostics: Spirometry:  Tracings reviewed. Her effort: Good reproducible efforts. FVC: 2.96L FEV1: 2.03L, 74% predicted FEV1/FVC ratio: 69% Interpretation: Spirometry consistent with normal pattern.  Please see scanned spirometry results for details.  Medication List:  Current Outpatient Medications  Medication Sig Dispense Refill   albuterol (PROVENTIL) (2.5 MG/3ML) 0.083% nebulizer solution Take 3 mLs (2.5 mg total) by nebulization every 6 (six) hours as needed for wheezing or shortness of breath (coughing fit). 75 mL 1   albuterol (VENTOLIN HFA) 108 (90 Base) MCG/ACT inhaler INHALE 1 TO 2 PUFFS INTO THE LUNGS EVERY 6 HOURS AS NEEDED FOR WHEEZING OR SHORTNESS OF BREATH (Patient taking differently: Inhale 1-2 puffs into the lungs every 6 (six) hours as needed for wheezing or shortness of breath.) 8.5 g 0   Ascorbic Acid (VITAMIN C) 1000 MG tablet Take 1,000 mg by mouth daily.  cholecalciferol (VITAMIN D3) 25 MCG (1000 UNIT) tablet Take 1,000 Units by mouth daily.     EPINEPHrine 0.3 mg/0.3 mL IJ SOAJ injection Inject 0.3 mg into the muscle as needed. 1 each 2   fluticasone (FLONASE) 50 MCG/ACT nasal spray Place 1 spray into both nostrils 2 (two) times daily as needed (nasal congestion). 16 g 5   gabapentin (NEURONTIN) 300 MG capsule Take 300 mg by mouth 3 (three) times daily as needed (pain).     Galcanezumab-gnlm (EMGALITY) 120 MG/ML SOAJ Inject 120 mg into the skin every 30 (thirty) days.      levocetirizine (XYZAL) 5 MG tablet Take 1 tablet (5 mg total) by mouth every evening. 30 tablet 5   mometasone-formoterol (DULERA) 100-5 MCG/ACT  AERO Inhale 2 puffs into the lungs in the morning and at bedtime. with spacer and rinse mouth afterwards. 1 each 5   montelukast (SINGULAIR) 10 MG tablet Take 1 tablet (10 mg total) by mouth at bedtime. 30 tablet 5   omeprazole (PRILOSEC) 40 MG capsule TAKE ONE CAPSULE BY MOUTH DAILY (Patient taking differently: Take 40 mg by mouth at bedtime.) 90 capsule 1   oxyCODONE-acetaminophen (PERCOCET) 10-325 MG tablet Take 1 tablet by mouth every 4 (four) hours as needed for pain. 30 tablet 0   tiZANidine (ZANAFLEX) 4 MG tablet TAKE 1 TABLET BY MOUTH EVERY 6 HOURS AS NEEDED FOR MUSCLE SPASMS (Patient taking differently: Take 4 mg by mouth every 6 (six) hours as needed for muscle spasms. TAKE 1 TABLET BY MOUTH EVERY 6 HOURS AS NEEDED FOR MUSCLE SPASMS) 60 tablet 1   topiramate (TOPAMAX) 100 MG tablet Take 1 tablet (100 mg total) by mouth 2 (two) times daily. 180 tablet 3   zolpidem (AMBIEN) 10 MG tablet 1 p.o. nightly (Patient taking differently: Take 10 mg by mouth at bedtime. 1 p.o. nightly) 30 tablet 2   No current facility-administered medications for this visit.   Allergies: Allergies  Allergen Reactions   Other Anaphylaxis    ALL NUTS   Peanut-Containing Drug Products Anaphylaxis   Sesame Seed (Diagnostic) Anaphylaxis   Peanut Oil Other (See Comments)    Pt states she is not allergic to peanuts but is allergic to all other nuts.    I reviewed her past medical history, social history, family history, and environmental history and no significant changes have been reported from her previous visit.  Review of Systems  Constitutional:  Negative for appetite change, chills, fever and unexpected weight change.  HENT:  Negative for congestion and rhinorrhea.   Eyes:  Negative for itching.  Respiratory:  Negative for cough, chest tightness, shortness of breath and wheezing.   Gastrointestinal:  Negative for abdominal pain.  Skin:  Negative for rash.  Allergic/Immunologic: Positive for environmental  allergies and food allergies.  Neurological:  Negative for headaches.   Objective: BP 124/76   Pulse 78   Temp 97.9 F (36.6 C) (Temporal)   Resp 16   Ht '5\' 9"'$  (1.753 m)   Wt 200 lb 2 oz (90.8 kg)   SpO2 98%   BMI 29.55 kg/m  Body mass index is 29.55 kg/m. Physical Exam Vitals and nursing note reviewed.  Constitutional:      Appearance: Normal appearance. She is well-developed.  HENT:     Head: Normocephalic and atraumatic.     Right Ear: Tympanic membrane and external ear normal.     Left Ear: Tympanic membrane and external ear normal.     Nose: Congestion present.  Mouth/Throat:     Mouth: Mucous membranes are moist.     Pharynx: Oropharynx is clear.  Eyes:     Conjunctiva/sclera: Conjunctivae normal.  Cardiovascular:     Rate and Rhythm: Normal rate and regular rhythm.     Heart sounds: Normal heart sounds. No murmur heard. Pulmonary:     Effort: Pulmonary effort is normal.     Breath sounds: Normal breath sounds. No wheezing, rhonchi or rales.  Musculoskeletal:     Cervical back: Neck supple.  Skin:    General: Skin is warm.     Findings: No rash.  Neurological:     Mental Status: She is alert and oriented to person, place, and time.  Psychiatric:        Behavior: Behavior normal.   Previous notes and tests were reviewed. The plan was reviewed with the patient/family, and all questions/concerned were addressed.  It was my pleasure to see Kathy Hodge today and participate in her care. Please feel free to contact me with any questions or concerns.  Sincerely,  Rexene Alberts, DO Allergy & Immunology  [facility] office: [place] office: [phone]

## 2021-05-02 ENCOUNTER — Encounter: Payer: Self-pay | Admitting: Allergy

## 2021-05-02 ENCOUNTER — Other Ambulatory Visit: Payer: Self-pay

## 2021-05-02 ENCOUNTER — Ambulatory Visit (INDEPENDENT_AMBULATORY_CARE_PROVIDER_SITE_OTHER): Payer: 59 | Admitting: Allergy

## 2021-05-02 VITALS — BP 124/76 | HR 78 | Temp 97.9°F | Resp 16 | Ht 69.0 in | Wt 200.1 lb

## 2021-05-02 DIAGNOSIS — R12 Heartburn: Secondary | ICD-10-CM | POA: Diagnosis not present

## 2021-05-02 DIAGNOSIS — T7809XD Anaphylactic reaction due to other food products, subsequent encounter: Secondary | ICD-10-CM

## 2021-05-02 DIAGNOSIS — J3089 Other allergic rhinitis: Secondary | ICD-10-CM

## 2021-05-02 DIAGNOSIS — T7800XD Anaphylactic reaction due to unspecified food, subsequent encounter: Secondary | ICD-10-CM

## 2021-05-02 DIAGNOSIS — J302 Other seasonal allergic rhinitis: Secondary | ICD-10-CM | POA: Diagnosis not present

## 2021-05-02 DIAGNOSIS — J4540 Moderate persistent asthma, uncomplicated: Secondary | ICD-10-CM | POA: Diagnosis not present

## 2021-05-02 MED ORDER — LEVOCETIRIZINE DIHYDROCHLORIDE 5 MG PO TABS: 5.0000 mg | ORAL_TABLET | Freq: Every evening | ORAL | 5 refills | Status: DC

## 2021-05-02 MED ORDER — MONTELUKAST SODIUM 10 MG PO TABS: 10.0000 mg | ORAL_TABLET | Freq: Every day | ORAL | 5 refills | Status: DC

## 2021-05-02 MED ORDER — EPINEPHRINE 0.3 MG/0.3ML IJ SOAJ: 0.3000 mg | INTRAMUSCULAR | 2 refills | Status: DC | PRN

## 2021-05-02 MED ORDER — FLUTICASONE PROPIONATE 50 MCG/ACT NA SUSP: 1.0000 | Freq: Two times a day (BID) | NASAL | 5 refills | Status: DC | PRN

## 2021-05-02 MED ORDER — DULERA 100-5 MCG/ACT IN AERO: 2.0000 | INHALATION_SPRAY | Freq: Two times a day (BID) | RESPIRATORY_TRACT | 5 refills | Status: DC

## 2021-05-02 NOTE — Assessment & Plan Note (Signed)
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  Patient consumes limited sesame and finned fish.  2020 skin testing was positive to tree nuts, fish mix and sesame.  Negative to peanuts. 2020 bloodwork was positive to tree nuts, sesame.  Interim history - itching with pistachio contact; throat discomfort with sesame ingestion.  Continue strict avoidance of peanuts, tree nuts, sesame.  For mild symptoms you can take over the counter antihistamines such as Benadryl and monitor symptoms closely. If symptoms worsen or if you have severe symptoms including breathing issues, throat closure, significant swelling, whole body hives, severe diarrhea and vomiting, lightheadedness then inject epinephrine and seek immediate medical care afterwards.  Food action plan in place.

## 2021-05-02 NOTE — Assessment & Plan Note (Signed)
Past history - Intermittent rhinitis symptoms for the past 20+ years mainly during seasonal changes. 2020 skin testing was negative to environmental allergy panel. Declined intradermal testing. 2020 immunocap positive to dog and borderline to tree pollen.  Interim history - increased rhinitis symptoms during weather changes.   Continue environmental control measures.   May need to take the below allergy medications on a daily basis during the spring.  Use over the counter antihistamines such as Zyrtec (cetirizine), Claritin (loratadine), Allegra (fexofenadine), or Xyzal (levocetirizine) daily as needed. May take twice a day during allergy flares. May switch antihistamines every few months. . Use Flonase (fluticasone) nasal spray 1 spray per nostril twice a day as needed for nasal congestion.  . Nasal saline spray (i.e., Simply Saline) or nasal saline lavage (i.e., NeilMed) is recommended as needed and prior to medicated nasal sprays.

## 2021-05-02 NOTE — Patient Instructions (Addendum)
Asthma For the next 1 week start Dulera 171mg 2 puffs twice a day with spacer and rinse mouth afterwards.  Daily controller medication(s): Continue Singulair '10mg'$  daily. During upper respiratory infections/asthma flares: start Dulera 1016m 2 puffs twice a day for 1-2 weeks until your breathing symptoms return to baseline.  May use albuterol rescue inhaler 2 puffs or nebulizer every 4 to 6 hours as needed for shortness of breath, chest tightness, coughing, and wheezing. May use albuterol rescue inhaler 2 puffs 5 to 15 minutes prior to strenuous physical activities. Monitor frequency of use.  Asthma control goals:  Full participation in all desired activities (may need albuterol before activity) Albuterol use two times or less a week on average (not counting use with activity) Cough interfering with sleep two times or less a month Oral steroids no more than once a year No hospitalizations    Other allergic rhinitis 2020 bloodwork positive to dog and borderline to tree pollen.  Continue environmental control measures.  May need to take the below allergy medications on a daily basis during the spring. Use over the counter antihistamines such as Zyrtec (cetirizine), Claritin (loratadine), Allegra (fexofenadine), or Xyzal (levocetirizine) daily as needed. May take twice a day during allergy flares. May switch antihistamines every few months. Use Flonase (fluticasone) nasal spray 1 spray per nostril twice a day as needed for nasal congestion.  Nasal saline spray (i.e., Simply Saline) or nasal saline lavage (i.e., NeilMed) is recommended as needed and prior to medicated nasal sprays.   Food allergy: 2020 bloodwork was positive to tree nuts, sesame.  Continue strict avoidance of peanuts, tree nuts, sesame. For mild symptoms you can take over the counter antihistamines such as Benadryl and monitor symptoms closely. If symptoms worsen or if you have severe symptoms including breathing issues, throat  closure, significant swelling, whole body hives, severe diarrhea and vomiting, lightheadedness then inject epinephrine and seek immediate medical care afterwards. Food action plan in place.    Heartburn Take omeprazole '40mg'$  in the morning. Nothing to eat or drink for 30 minutes.   Follow up in 6 months or sooner if needed.   Reducing Pollen Exposure Pollen seasons: trees (spring), grass (summer) and ragweed/weeds (fall). Keep windows closed in your home and car to lower pollen exposure.  Install air conditioning in the bedroom and throughout the house if possible.  Avoid going out in dry windy days - especially early morning. Pollen counts are highest between 5 - 10 AM and on dry, hot and windy days.  Save outside activities for late afternoon or after a heavy rain, when pollen levels are lower.  Avoid mowing of grass if you have grass pollen allergy. Be aware that pollen can also be transported indoors on people and pets.  Dry your clothes in an automatic dryer rather than hanging them outside where they might collect pollen.  Rinse hair and eyes before bedtime. Pet Allergen Avoidance: Contrary to popular opinion, there are no "hypoallergenic" breeds of dogs or cats. That is because people are not allergic to an animal's hair, but to an allergen found in the animal's saliva, dander (dead skin flakes) or urine. Pet allergy symptoms typically occur within minutes. For some people, symptoms can build up and become most severe 8 to 12 hours after contact with the animal. People with severe allergies can experience reactions in public places if dander has been transported on the pet owners' clothing. Keeping an animal outdoors is only a partial solution, since homes with pets in  the yard still have higher concentrations of animal allergens. Before getting a pet, ask your allergist to determine if you are allergic to animals. If your pet is already considered part of your family, try to minimize  contact and keep the pet out of the bedroom and other rooms where you spend a great deal of time. As with dust mites, vacuum carpets often or replace carpet with a hardwood floor, tile or linoleum. High-efficiency particulate air (HEPA) cleaners can reduce allergen levels over time. While dander and saliva are the source of cat and dog allergens, urine is the source of allergens from rabbits, hamsters, mice and [place] pigs; so ask a non-allergic family member to clean the animal's cage. If you have a pet allergy, talk to your allergist about the potential for allergy immunotherapy (allergy shots). This strategy can often provide long-term relief.

## 2021-05-02 NOTE — Assessment & Plan Note (Signed)
Past history - Diagnosed with asthma as a child.  Ex-smoker. 2020 spirometry was normal however it did show 18% improvement in FEV1 post bronchodilator treatment. COVID-19 in November 2020. Interim history - stopped Dulera completely 6 months ago and only requiring albuterol on rare occasions with high pollen count and weather changes. Had Covid-19 3 times now - unvaccinated.  Today's spirometry was normal.   For the next 1 week start Dulera 182mg 2 puffs twice a day with spacer and rinse mouth afterwards.  Daily controller medication(s): Continue Singulair '10mg'$  daily. . During upper respiratory infections/asthma flares: start Dulera 1074m 2 puffs twice a day for 1-2 weeks until your breathing symptoms return to baseline.  . May use albuterol rescue inhaler 2 puffs or nebulizer every 4 to 6 hours as needed for shortness of breath, chest tightness, coughing, and wheezing. May use albuterol rescue inhaler 2 puffs 5 to 15 minutes prior to strenuous physical activities. Monitor frequency of use.   Get spirometry at next visit.

## 2021-05-02 NOTE — Assessment & Plan Note (Signed)
Stable.   Take omeprazole '40mg'$  in the morning. Nothing to eat or drink for 30 minutes.

## 2021-05-03 ENCOUNTER — Other Ambulatory Visit: Payer: Self-pay | Admitting: *Deleted

## 2021-05-21 ENCOUNTER — Encounter (HOSPITAL_BASED_OUTPATIENT_CLINIC_OR_DEPARTMENT_OTHER): Payer: Self-pay | Admitting: *Deleted

## 2021-05-21 ENCOUNTER — Other Ambulatory Visit: Payer: Self-pay

## 2021-05-21 ENCOUNTER — Emergency Department (HOSPITAL_BASED_OUTPATIENT_CLINIC_OR_DEPARTMENT_OTHER)
Admission: EM | Admit: 2021-05-21 | Discharge: 2021-05-21 | Disposition: A | Discharge status: Home / Self Care | Payer: 59 | Attending: Emergency Medicine | Admitting: Emergency Medicine

## 2021-05-21 ENCOUNTER — Emergency Department (HOSPITAL_BASED_OUTPATIENT_CLINIC_OR_DEPARTMENT_OTHER): Payer: 59

## 2021-05-21 DIAGNOSIS — J45909 Unspecified asthma, uncomplicated: Secondary | ICD-10-CM | POA: Insufficient documentation

## 2021-05-21 DIAGNOSIS — M79605 Pain in left leg: Secondary | ICD-10-CM | POA: Insufficient documentation

## 2021-05-21 DIAGNOSIS — Z7951 Long term (current) use of inhaled steroids: Secondary | ICD-10-CM | POA: Diagnosis not present

## 2021-05-21 DIAGNOSIS — Z8616 Personal history of COVID-19: Secondary | ICD-10-CM | POA: Insufficient documentation

## 2021-05-21 DIAGNOSIS — Z91010 Allergy to peanuts: Secondary | ICD-10-CM | POA: Diagnosis not present

## 2021-05-21 DIAGNOSIS — M79661 Pain in right lower leg: Secondary | ICD-10-CM

## 2021-05-21 DIAGNOSIS — Z87891 Personal history of nicotine dependence: Secondary | ICD-10-CM | POA: Diagnosis not present

## 2021-05-21 DIAGNOSIS — I10 Essential (primary) hypertension: Secondary | ICD-10-CM | POA: Insufficient documentation

## 2021-05-21 MED ORDER — ACETAMINOPHEN 325 MG PO TABS
650.0000 mg | ORAL_TABLET | Freq: Once | ORAL | Status: AC
  Administered 2021-05-21: 650 mg via ORAL
  Filled 2021-05-21: qty 2
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
## 2021-05-21 NOTE — ED Triage Notes (Signed)
Patient stated called her specialist [facility] d/t pain and they said they did not have opening so she came to be seen. Rates pain 7-8 from knee down left leg with stabbing pain. Took tylenol without relief.

## 2021-07-08 ENCOUNTER — Other Ambulatory Visit: Payer: Self-pay | Admitting: Pulmonary Disease

## 2021-07-12 ENCOUNTER — Other Ambulatory Visit: Payer: Self-pay | Admitting: Pulmonary Disease

## 2021-08-07 ENCOUNTER — Emergency Department (HOSPITAL_BASED_OUTPATIENT_CLINIC_OR_DEPARTMENT_OTHER): Payer: 59

## 2021-08-07 ENCOUNTER — Other Ambulatory Visit: Payer: Self-pay

## 2021-08-07 ENCOUNTER — Encounter (HOSPITAL_BASED_OUTPATIENT_CLINIC_OR_DEPARTMENT_OTHER): Payer: Self-pay

## 2021-08-07 ENCOUNTER — Emergency Department (HOSPITAL_BASED_OUTPATIENT_CLINIC_OR_DEPARTMENT_OTHER)
Admission: EM | Admit: 2021-08-07 | Discharge: 2021-08-07 | Disposition: A | Discharge status: Home / Self Care | Payer: 59 | Attending: Emergency Medicine | Admitting: Emergency Medicine

## 2021-08-07 DIAGNOSIS — Z79899 Other long term (current) drug therapy: Secondary | ICD-10-CM | POA: Diagnosis not present

## 2021-08-07 DIAGNOSIS — Z91010 Allergy to peanuts: Secondary | ICD-10-CM | POA: Insufficient documentation

## 2021-08-07 DIAGNOSIS — Z8616 Personal history of COVID-19: Secondary | ICD-10-CM | POA: Diagnosis not present

## 2021-08-07 DIAGNOSIS — R1013 Epigastric pain: Secondary | ICD-10-CM | POA: Diagnosis not present

## 2021-08-07 DIAGNOSIS — R112 Nausea with vomiting, unspecified: Secondary | ICD-10-CM | POA: Insufficient documentation

## 2021-08-07 DIAGNOSIS — Z20822 Contact with and (suspected) exposure to covid-19: Secondary | ICD-10-CM | POA: Diagnosis not present

## 2021-08-07 DIAGNOSIS — Z7951 Long term (current) use of inhaled steroids: Secondary | ICD-10-CM | POA: Insufficient documentation

## 2021-08-07 DIAGNOSIS — J45909 Unspecified asthma, uncomplicated: Secondary | ICD-10-CM | POA: Insufficient documentation

## 2021-08-07 DIAGNOSIS — R0789 Other chest pain: Secondary | ICD-10-CM | POA: Diagnosis not present

## 2021-08-07 DIAGNOSIS — Z87891 Personal history of nicotine dependence: Secondary | ICD-10-CM | POA: Insufficient documentation

## 2021-08-07 DIAGNOSIS — R519 Headache, unspecified: Secondary | ICD-10-CM | POA: Insufficient documentation

## 2021-08-07 DIAGNOSIS — I10 Essential (primary) hypertension: Secondary | ICD-10-CM | POA: Insufficient documentation

## 2021-08-07 LAB — CBC WITH DIFFERENTIAL/PLATELET
Abs Immature Granulocytes: 0.01 10*3/uL (ref 0.00–0.07)
Basophils Absolute: 0 10*3/uL (ref 0.0–0.1)
Basophils Relative: 1 %
Eosinophils Absolute: 0 10*3/uL (ref 0.0–0.5)
Eosinophils Relative: 0 %
HCT: 43.1 % (ref 36.0–46.0)
Hemoglobin: 14 g/dL (ref 12.0–15.0)
Immature Granulocytes: 0 %
Lymphocytes Relative: 39 %
Lymphs Abs: 2 10*3/uL (ref 0.7–4.0)
MCH: 28.5 pg (ref 26.0–34.0)
MCHC: 32.5 g/dL (ref 30.0–36.0)
MCV: 87.6 fL (ref 80.0–100.0)
Monocytes Absolute: 0.4 10*3/uL (ref 0.1–1.0)
Monocytes Relative: 7 %
Neutro Abs: 2.6 10*3/uL (ref 1.7–7.7)
Neutrophils Relative %: 53 %
Platelets: 210 10*3/uL (ref 150–400)
RBC: 4.92 MIL/uL (ref 3.87–5.11)
RDW: 12.7 % (ref 11.5–15.5)
WBC: 5 10*3/uL (ref 4.0–10.5)
nRBC: 0 % (ref 0.0–0.2)

## 2021-08-07 LAB — URINALYSIS, ROUTINE W REFLEX MICROSCOPIC
Bilirubin Urine: NEGATIVE
Glucose, UA: NEGATIVE mg/dL
Hgb urine dipstick: NEGATIVE
Ketones, ur: 15 mg/dL — AB
Nitrite: NEGATIVE
Protein, ur: NEGATIVE mg/dL
Specific Gravity, Urine: >1.03 — ABNORMAL HIGH (ref 1.005–1.030)
pH: 6 (ref 5.0–8.0)

## 2021-08-07 LAB — COMPREHENSIVE METABOLIC PANEL
ALT: 18 U/L (ref 0–44)
AST: 17 U/L (ref 15–41)
Albumin: 4.3 g/dL (ref 3.5–5.0)
Alkaline Phosphatase: 94 U/L (ref 38–126)
Anion gap: 9 (ref 5–15)
BUN: 10 mg/dL (ref 6–20)
CO2: 21 mmol/L — ABNORMAL LOW (ref 22–32)
Calcium: 9.2 mg/dL (ref 8.9–10.3)
Chloride: 108 mmol/L (ref 98–111)
Creatinine, Ser: 1.04 mg/dL — ABNORMAL HIGH (ref 0.44–1.00)
GFR, Estimated: >60 mL/min (ref >60)
Glucose, Bld: 89 mg/dL (ref 70–99)
Potassium: 3.5 mmol/L (ref 3.5–5.1)
Sodium: 138 mmol/L (ref 135–145)
Total Bilirubin: 1 mg/dL (ref 0.3–1.2)
Total Protein: 7.9 g/dL (ref 6.5–8.1)

## 2021-08-07 LAB — RESP PANEL BY RT-PCR (FLU A&B, COVID) ARPGX2
Influenza A by PCR: NEGATIVE
Influenza B by PCR: NEGATIVE
SARS Coronavirus 2 by RT PCR: NEGATIVE

## 2021-08-07 LAB — URINALYSIS, MICROSCOPIC (REFLEX): RBC / HPF: NONE SEEN RBC/hpf (ref 0–5)

## 2021-08-07 LAB — TROPONIN I (HIGH SENSITIVITY): Troponin I (High Sensitivity): 4 ng/L (ref <18)

## 2021-08-07 LAB — PREGNANCY, URINE: Preg Test, Ur: NEGATIVE

## 2021-08-07 LAB — LIPASE, BLOOD: Lipase: 32 U/L (ref 11–51)

## 2021-08-07 MED ORDER — SODIUM CHLORIDE 0.9 % IV BOLUS
1000.0000 mL | Freq: Once | INTRAVENOUS | Status: AC
  Administered 2021-08-07: 1000 mL via INTRAVENOUS

## 2021-08-07 MED ORDER — ONDANSETRON HCL 4 MG/2ML IJ SOLN
4.0000 mg | Freq: Once | INTRAMUSCULAR | Status: AC
  Administered 2021-08-07: 4 mg via INTRAVENOUS
  Filled 2021-08-07: qty 2

## 2021-08-07 MED ORDER — ACETAMINOPHEN 325 MG PO TABS
650.0000 mg | ORAL_TABLET | Freq: Once | ORAL | Status: AC
  Administered 2021-08-07: 650 mg via ORAL
  Filled 2021-08-07: qty 2

## 2021-08-07 MED ORDER — ALUM & MAG HYDROXIDE-SIMETH 200-200-20 MG/5ML PO SUSP
30.0000 mL | Freq: Once | ORAL | Status: AC
  Administered 2021-08-07: 30 mL via ORAL
  Filled 2021-08-07: qty 30

## 2021-08-07 MED ORDER — ONDANSETRON 4 MG PO TBDP: 4.0000 mg | ORAL_TABLET | Freq: Three times a day (TID) | ORAL | 0 refills | Status: AC | PRN

## 2021-08-07 MED ORDER — LIDOCAINE VISCOUS HCL 2 % MT SOLN
15.0000 mL | Freq: Once | OROMUCOSAL | Status: AC
  Administered 2021-08-07: 15 mL via ORAL
  Filled 2021-08-07: qty 15

## 2021-08-07 NOTE — Discharge Instructions (Signed)
I am prescribing you a medication called Zofran.  This is a disintegrating tablet you can use up to 3 times a day for management of your nausea and vomiting.  Please only take this as prescribed.  Please only take this if you are experiencing nausea and vomiting that you cannot control.  Please follow-up with your regular doctor as well as your neurologist regarding your symptoms today.  If you develop any new or worsening symptoms please come back to the emergency department.

## 2021-08-07 NOTE — ED Notes (Signed)
ED Provider at bedside. 

## 2021-08-07 NOTE — ED Notes (Signed)
Patient transported to X-ray 
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
## 2021-08-07 NOTE — ED Triage Notes (Signed)
Pt c/o HA, CP, n/v x 4 days-denies fever/flu sx except runny nose-NAD-steady gait

## 2021-10-29 ENCOUNTER — Other Ambulatory Visit: Payer: Self-pay | Admitting: Allergy

## 2021-10-30 NOTE — Progress Notes (Unsigned)
Follow Up Note  RE: Kathy Hodge MRN: [id number] DOB: 1971/06/30 Date of Office Visit: 10/31/2021  Referring provider: No ref. provider found Primary care provider: Riki Sheer, NP  Chief Complaint: No chief complaint on file.  History of Present Illness: I had the pleasure of seeing Kathy Hodge for a follow up visit at the [facility]  on 10/30/2021. She is a 51 y.o. female, who is being followed for asthma, allergic rhinitis, food allergy and heartburn. Her previous allergy office visit was on 05/02/2021 with Dr. Maudie Mercury. Today is a regular follow up visit.  Asthma, well controlled, moderate persistent Past history - Diagnosed with asthma as a child.  Ex-smoker. 2020 spirometry was normal however it did show 18% improvement in FEV1 post bronchodilator treatment. COVID-19 in November 2020. Interim history - stopped Dulera completely 6 months ago and only requiring albuterol on rare occasions with high pollen count and weather changes. Had Covid-19 3 times now - unvaccinated. Today's spirometry was normal.  For the next 1 week start Dulera 162mcg 2 puffs twice a day with spacer and rinse mouth afterwards. Daily controller medication(s): Continue Singulair 10mg  daily. During upper respiratory infections/asthma flares: start Dulera 166mcg 2 puffs twice a day for 1-2 weeks until your breathing symptoms return to baseline.  May use albuterol rescue inhaler 2 puffs or nebulizer every 4 to 6 hours as needed for shortness of breath, chest tightness, coughing, and wheezing. May use albuterol rescue inhaler 2 puffs 5 to 15 minutes prior to strenuous physical activities. Monitor frequency of use.  Get spirometry at next visit.   Seasonal and perennial allergic rhinitis Past history - Intermittent rhinitis symptoms for the past 20+ years mainly during seasonal changes. 2020 skin testing was negative to environmental allergy panel. Declined intradermal testing. 2020 immunocap  positive to dog and borderline to tree pollen.  Interim history - increased rhinitis symptoms during weather changes.  Continue environmental control measures.  May need to take the below allergy medications on a daily basis during the spring. Use over the counter antihistamines such as Zyrtec (cetirizine), Claritin (loratadine), Allegra (fexofenadine), or Xyzal (levocetirizine) daily as needed. May take twice a day during allergy flares. May switch antihistamines every few months. Use Flonase (fluticasone) nasal spray 1 spray per nostril twice a day as needed for nasal congestion.  Nasal saline spray (i.e., Simply Saline) or nasal saline lavage (i.e., NeilMed) is recommended as needed and prior to medicated nasal sprays.   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  Patient consumes limited sesame and finned fish.  2020 skin testing was positive to tree nuts, fish mix and sesame.  Negative to peanuts. 2020 bloodwork was positive to tree nuts, sesame.  Interim history - itching with pistachio contact; throat discomfort with sesame ingestion. Continue strict avoidance of peanuts, tree nuts, sesame. For mild symptoms you can take over the counter antihistamines such as Benadryl and monitor symptoms closely. If symptoms worsen or if you have severe symptoms including breathing issues, throat closure, significant swelling, whole body hives, severe diarrhea and vomiting, lightheadedness then inject epinephrine and seek immediate medical care afterwards. Food action plan in place.    Heartburn Stable.  Take omeprazole 40mg  in the morning. Nothing to eat or drink for 30 minutes.  Assessment and Plan: Kathy Hodge is a 51 y.o. female with: No problem-specific Assessment & Plan notes found for this encounter.  No follow-ups  on file.  No orders of the defined types were placed in this encounter.  Lab Orders  No laboratory  test(s) ordered today    Diagnostics: Spirometry:  Tracings reviewed. Her effort: {Blank single:19197::"Good reproducible efforts.","It was hard to get consistent efforts and there is a question as to whether this reflects a maximal maneuver.","Poor effort, data can not be interpreted."} FVC: ***L FEV1: ***L, ***% predicted FEV1/FVC ratio: ***% Interpretation: {Blank single:19197::"Spirometry consistent with mild obstructive disease","Spirometry consistent with moderate obstructive disease","Spirometry consistent with severe obstructive disease","Spirometry consistent with possible restrictive disease","Spirometry consistent with mixed obstructive and restrictive disease","Spirometry uninterpretable due to technique","Spirometry consistent with normal pattern","No overt abnormalities noted given today's efforts"}.  Please see scanned spirometry results for details.  Skin Testing: {Blank single:19197::"Select foods","Environmental allergy panel","Environmental allergy panel and select foods","Food allergy panel","None","Deferred due to recent antihistamines use"}. *** Results discussed with patient/family.   Medication List:  Current Outpatient Medications  Medication Sig Dispense Refill   albuterol (PROVENTIL) (2.5 MG/3ML) 0.083% nebulizer solution Take 3 mLs (2.5 mg total) by nebulization every 6 (six) hours as needed for wheezing or shortness of breath (coughing fit). 75 mL 1   albuterol (VENTOLIN HFA) 108 (90 Base) MCG/ACT inhaler INHALE 1 TO 2 PUFFS INTO THE LUNGS EVERY 6 HOURS AS NEEDED FOR WHEEZING OR SHORTNESS OF BREATH (Patient taking differently: Inhale 1-2 puffs into the lungs every 6 (six) hours as needed for wheezing or shortness of breath.) 8.5 g 0   Ascorbic Acid (VITAMIN C) 1000 MG tablet Take 1,000 mg by mouth daily.     cholecalciferol (VITAMIN D3) 25 MCG (1000 UNIT) tablet Take 1,000 Units by mouth daily.     EPINEPHrine 0.3 mg/0.3 mL IJ SOAJ injection Inject 0.3 mg  into the muscle as needed. 1 each 2   fluticasone (FLONASE) 50 MCG/ACT nasal spray Place 1 spray into both nostrils 2 (two) times daily as needed (nasal congestion). 16 g 5   gabapentin (NEURONTIN) 300 MG capsule Take 300 mg by mouth 3 (three) times daily as needed (pain).     Galcanezumab-gnlm (EMGALITY) 120 MG/ML SOAJ Inject 120 mg into the skin every 30 (thirty) days.      levocetirizine (XYZAL) 5 MG tablet Take 1 tablet (5 mg total) by mouth every evening. 30 tablet 5   mometasone-formoterol (DULERA) 100-5 MCG/ACT AERO Inhale 2 puffs into the lungs in the morning and at bedtime. with spacer and rinse mouth afterwards. 1 each 5   montelukast (SINGULAIR) 10 MG tablet TAKE 1 TABLET(10 MG) BY MOUTH AT BEDTIME 90 tablet 0   omeprazole (PRILOSEC) 40 MG capsule TAKE ONE CAPSULE BY MOUTH DAILY (Patient taking differently: Take 40 mg by mouth at bedtime.) 90 capsule 1   ondansetron (ZOFRAN-ODT) 4 MG disintegrating tablet Take 1 tablet (4 mg total) by mouth every 8 (eight) hours as needed for nausea or vomiting. 8 tablet 0   oxyCODONE-acetaminophen (PERCOCET) 10-325 MG tablet Take 1 tablet by mouth every 4 (four) hours as needed for pain. 30 tablet 0   tiZANidine (ZANAFLEX) 4 MG tablet TAKE 1 TABLET BY MOUTH EVERY 6 HOURS AS NEEDED FOR MUSCLE SPASMS (Patient taking differently: Take 4 mg by mouth every 6 (six) hours as needed for muscle spasms. TAKE 1 TABLET BY MOUTH EVERY 6 HOURS AS NEEDED FOR MUSCLE SPASMS) 60 tablet 1   topiramate (TOPAMAX) 100 MG tablet Take 1 tablet (100 mg total) by mouth 2 (two) times daily. 180 tablet 3   zolpidem (AMBIEN) 10 MG tablet 1 p.o. nightly (  Patient taking differently: Take 10 mg by mouth at bedtime. 1 p.o. nightly) 30 tablet 2   No current facility-administered medications for this visit.   Allergies: Allergies  Allergen Reactions   Other Anaphylaxis    ALL NUTS   Peanut-Containing Drug Products Anaphylaxis   Sesame Seed (Diagnostic) Anaphylaxis    Peanut Oil Other (See Comments)    Pt states she is not allergic to peanuts but is allergic to all other nuts.    I reviewed her past medical history, social history, family history, and environmental history and no significant changes have been reported from her previous visit.  Review of Systems  Constitutional:  Negative for appetite change, chills, fever and unexpected weight change.  HENT:  Negative for congestion and rhinorrhea.   Eyes:  Negative for itching.  Respiratory:  Negative for cough, chest tightness, shortness of breath and wheezing.   Gastrointestinal:  Negative for abdominal pain.  Skin:  Negative for rash.  Allergic/Immunologic: Positive for environmental allergies and food allergies.  Neurological:  Negative for headaches.   Objective: There were no vitals taken for this visit. There is no height or weight on file to calculate BMI. Physical Exam Vitals and nursing note reviewed.  Constitutional:      Appearance: Normal appearance. She is well-developed.  HENT:     Head: Normocephalic and atraumatic.     Right Ear: Tympanic membrane and external ear normal.     Left Ear: Tympanic membrane and external ear normal.     Nose: Congestion present.     Mouth/Throat:     Mouth: Mucous membranes are moist.     Pharynx: Oropharynx is clear.  Eyes:     Conjunctiva/sclera: Conjunctivae normal.  Cardiovascular:     Rate and Rhythm: Normal rate and regular rhythm.     Heart sounds: Normal heart sounds. No murmur heard. Pulmonary:     Effort: Pulmonary effort is normal.     Breath sounds: Normal breath sounds. No wheezing, rhonchi or rales.  Musculoskeletal:     Cervical back: Neck supple.  Skin:    General: Skin is warm.     Findings: No rash.  Neurological:     Mental Status: She is alert and oriented to person, place, and time.  Psychiatric:        Behavior: Behavior normal.  Previous notes and tests were reviewed. The plan was reviewed with the  patient/family, and all questions/concerned were addressed.  It was my pleasure to see Kathy Hodge today and participate in her care. Please feel free to contact me with any questions or concerns.  Sincerely,  Rexene Alberts, DO Allergy & Immunology  [facility] office: [place] office: [phone]

## 2021-10-31 ENCOUNTER — Ambulatory Visit: Payer: 59 | Admitting: Allergy

## 2021-10-31 DIAGNOSIS — T7800XD Anaphylactic reaction due to unspecified food, subsequent encounter: Secondary | ICD-10-CM

## 2021-10-31 DIAGNOSIS — R12 Heartburn: Secondary | ICD-10-CM

## 2021-10-31 DIAGNOSIS — J4540 Moderate persistent asthma, uncomplicated: Secondary | ICD-10-CM

## 2021-10-31 DIAGNOSIS — J302 Other seasonal allergic rhinitis: Secondary | ICD-10-CM

## 2022-01-27 ENCOUNTER — Other Ambulatory Visit: Payer: Self-pay | Admitting: Allergy

## 2022-01-29 ENCOUNTER — Other Ambulatory Visit: Payer: Self-pay | Admitting: Obstetrics and Gynecology

## 2022-01-29 DIAGNOSIS — N644 Mastodynia: Secondary | ICD-10-CM
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
## 2022-02-06 ENCOUNTER — Ambulatory Visit (INDEPENDENT_AMBULATORY_CARE_PROVIDER_SITE_OTHER): Payer: 59 | Admitting: Internal Medicine

## 2022-02-06 ENCOUNTER — Encounter: Payer: Self-pay | Admitting: Internal Medicine

## 2022-02-06 VITALS — BP 130/70 | HR 66 | Temp 98.0°F | Resp 12 | Ht 69.5 in | Wt 187.8 lb

## 2022-02-06 DIAGNOSIS — R12 Heartburn: Secondary | ICD-10-CM

## 2022-02-06 DIAGNOSIS — J3089 Other allergic rhinitis: Secondary | ICD-10-CM

## 2022-02-06 DIAGNOSIS — J302 Other seasonal allergic rhinitis: Secondary | ICD-10-CM

## 2022-02-06 DIAGNOSIS — J4540 Moderate persistent asthma, uncomplicated: Secondary | ICD-10-CM

## 2022-02-06 DIAGNOSIS — T7800XD Anaphylactic reaction due to unspecified food, subsequent encounter: Secondary | ICD-10-CM

## 2022-02-06 MED ORDER — FLUTICASONE PROPIONATE 50 MCG/ACT NA SUSP: 1.0000 | Freq: Two times a day (BID) | NASAL | 5 refills | Status: AC | PRN

## 2022-02-06 MED ORDER — OMEPRAZOLE 40 MG PO CPDR
40.0000 mg | DELAYED_RELEASE_CAPSULE | Freq: Every day | ORAL | 1 refills | Status: AC
Start: 2022-02-06 — End: ?

## 2022-02-06 MED ORDER — MONTELUKAST SODIUM 10 MG PO TABS: ORAL_TABLET | ORAL | 1 refills | Status: DC

## 2022-02-06 MED ORDER — EPINEPHRINE 0.3 MG/0.3ML IJ SOAJ: 0.3000 mg | INTRAMUSCULAR | 2 refills | Status: AC | PRN

## 2022-02-06 MED ORDER — LEVOCETIRIZINE DIHYDROCHLORIDE 5 MG PO TABS: 5.0000 mg | ORAL_TABLET | Freq: Every evening | ORAL | 1 refills | Status: DC

## 2022-02-06 MED ORDER — ALBUTEROL SULFATE HFA 108 (90 BASE) MCG/ACT IN AERS: INHALATION_SPRAY | RESPIRATORY_TRACT | 0 refills | Status: DC

## 2022-02-06 MED ORDER — DULERA 100-5 MCG/ACT IN AERO: 2.0000 | INHALATION_SPRAY | Freq: Two times a day (BID) | RESPIRATORY_TRACT | 5 refills | Status: AC

## 2022-02-06 MED ORDER — ALBUTEROL SULFATE (2.5 MG/3ML) 0.083% IN NEBU: 2.5000 mg | INHALATION_SOLUTION | Freq: Four times a day (QID) | RESPIRATORY_TRACT | 1 refills | Status: DC | PRN

## 2022-02-06 NOTE — Patient Instructions (Addendum)
Asthma-controlled During asthma flares or respiratory illness: start Dulera 179mg 2 puffs twice a day with spacer and rinse mouth afterwards.  Use for 1-2 weeks or until symptoms resolve..  Daily controller medication(s): Continue Singulair '10mg'$  daily. During upper respiratory infections/asthma flares: start Dulera 1034m 2 puffs twice a day for 1-2 weeks until your breathing symptoms return to baseline.  May use albuterol rescue inhaler 2 puffs or nebulizer every 4 to 6 hours as needed for shortness of breath, chest tightness, coughing, and wheezing. May use albuterol rescue inhaler 2 puffs 5 to 15 minutes prior to strenuous physical activities. Monitor frequency of use.  Asthma control goals:  Full participation in all desired activities (may need albuterol before activity) Albuterol use two times or less a week on average (not counting use with activity) Cough interfering with sleep two times or less a month Oral steroids no more than once a year No hospitalizations    Other allergic rhinitis 2020 bloodwork positive to dog and borderline to tree pollen.  Continue environmental control measures.  May need to take the below allergy medications on a daily basis during the spring. Use over the counter antihistamines such as Zyrtec (cetirizine), Claritin (loratadine), Allegra (fexofenadine), or Xyzal (levocetirizine) daily as needed. May take twice a day during allergy flares. May switch antihistamines every few months. Use Flonase (fluticasone) nasal spray 1 spray per nostril twice a day as needed for nasal congestion.  Nasal saline spray (i.e., Simply Saline) or nasal saline lavage (i.e., NeilMed) is recommended as needed and prior to medicated nasal sprays.   Food allergy: 2020 bloodwork was positive to tree nuts, sesame.  Continue strict avoidance of peanuts, tree nuts, sesame. For mild symptoms you can take over the counter antihistamines such as Benadryl and monitor symptoms closely. If  symptoms worsen or if you have severe symptoms including breathing issues, throat closure, significant swelling, whole body hives, severe diarrhea and vomiting, lightheadedness then inject epinephrine and seek immediate medical care afterwards. Food action plan in place.    Heartburn Take omeprazole '40mg'$  in the morning. Nothing to eat or drink for 30 minutes.   Follow up in 6 months or sooner if needed.  It was a pleasure meeting you today!  ErSigurd SosMD [facility]   Reducing Pollen Exposure Pollen seasons: trees (spring), grass (summer) and ragweed/weeds (fall). Keep windows closed in your home and car to lower pollen exposure.  Install air conditioning in the bedroom and throughout the house if possible.  Avoid going out in dry windy days - especially early morning. Pollen counts are highest between 5 - 10 AM and on dry, hot and windy days.  Save outside activities for late afternoon or after a heavy rain, when pollen levels are lower.  Avoid mowing of grass if you have grass pollen allergy. Be aware that pollen can also be transported indoors on people and pets.  Dry your clothes in an automatic dryer rather than hanging them outside where they might collect pollen.  Rinse hair and eyes before bedtime. Pet Allergen Avoidance: Contrary to popular opinion, there are no "hypoallergenic" breeds of dogs or cats. That is because people are not allergic to an animal's hair, but to an allergen found in the animal's saliva, dander (dead skin flakes) or urine. Pet allergy symptoms typically occur within minutes. For some people, symptoms can build up and become most severe 8 to 12 hours after contact with the animal. People with severe allergies can experience  reactions in public places if dander has been transported on the pet owners' clothing. Keeping an animal outdoors is only a partial solution, since homes with pets in the yard still have higher concentrations of animal  allergens. Before getting a pet, ask your allergist to determine if you are allergic to animals. If your pet is already considered part of your family, try to minimize contact and keep the pet out of the bedroom and other rooms where you spend a great deal of time. As with dust mites, vacuum carpets often or replace carpet with a hardwood floor, tile or linoleum. High-efficiency particulate air (HEPA) cleaners can reduce allergen levels over time. While dander and saliva are the source of cat and dog allergens, urine is the source of allergens from rabbits, hamsters, mice and [place] pigs; so ask a non-allergic family member to clean the animal's cage. If you have a pet allergy, talk to your allergist about the potential for allergy immunotherapy (allergy shots). This strategy can often provide long-term relief.

## 2022-03-04 ENCOUNTER — Other Ambulatory Visit: Payer: Self-pay | Admitting: Internal Medicine

## 2022-03-26 ENCOUNTER — Ambulatory Visit
Admission: RE | Admit: 2022-03-26 | Discharge: 2022-03-26 | Disposition: A | Discharge status: Home / Self Care | Payer: 59 | Source: Ambulatory Visit | Attending: Obstetrics and Gynecology | Admitting: Obstetrics and Gynecology

## 2022-03-26 DIAGNOSIS — N644 Mastodynia: Secondary | ICD-10-CM

## 2022-03-29 ENCOUNTER — Other Ambulatory Visit: Payer: Self-pay | Admitting: Internal Medicine

## 2022-07-14 ENCOUNTER — Other Ambulatory Visit: Payer: Self-pay | Admitting: Internal Medicine

## 2022-07-29 ENCOUNTER — Other Ambulatory Visit: Payer: Self-pay | Admitting: *Deleted

## 2022-07-29 ENCOUNTER — Other Ambulatory Visit: Payer: Self-pay | Admitting: Internal Medicine

## 2022-07-29 MED ORDER — LEVOCETIRIZINE DIHYDROCHLORIDE 5 MG PO TABS: 5.0000 mg | ORAL_TABLET | Freq: Every evening | ORAL | 1 refills | Status: DC

## 2022-08-01 ENCOUNTER — Other Ambulatory Visit: Payer: Self-pay

## 2022-08-01 MED ORDER — LEVOCETIRIZINE DIHYDROCHLORIDE 5 MG PO TABS: 5.0000 mg | ORAL_TABLET | Freq: Every evening | ORAL | 0 refills | Status: AC
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
## 2022-08-14 ENCOUNTER — Ambulatory Visit: Payer: 59 | Admitting: Internal Medicine

## 2022-09-20 ENCOUNTER — Ambulatory Visit
Admission: EM | Admit: 2022-09-20 | Discharge: 2022-09-20 | Disposition: A | Discharge status: Home / Self Care | Payer: 59 | Attending: Physician Assistant | Admitting: Physician Assistant

## 2022-09-20 DIAGNOSIS — N760 Acute vaginitis: Secondary | ICD-10-CM | POA: Diagnosis present
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
## 2022-09-20 NOTE — ED Triage Notes (Signed)
Patient presents to [facility] for vaginal itching and burning. Hx of bv. Self-treated with boric acid.

## 2022-09-23 LAB — CERVICOVAGINAL ANCILLARY ONLY
Bacterial Vaginitis (gardnerella): NEGATIVE
Candida Glabrata: NEGATIVE
Candida Vaginitis: NEGATIVE
Chlamydia: NEGATIVE
Comment: NEGATIVE
Comment: NEGATIVE
Comment: NEGATIVE
Comment: NEGATIVE
Comment: NEGATIVE
Comment: NORMAL
Neisseria Gonorrhea: NEGATIVE
Trichomonas: NEGATIVE

## 2022-11-22 ENCOUNTER — Emergency Department (HOSPITAL_BASED_OUTPATIENT_CLINIC_OR_DEPARTMENT_OTHER)
Admission: EM | Admit: 2022-11-22 | Discharge: 2022-11-22 | Disposition: A | Discharge status: Home / Self Care | Payer: 59 | Attending: Emergency Medicine | Admitting: Emergency Medicine

## 2022-11-22 ENCOUNTER — Emergency Department (HOSPITAL_BASED_OUTPATIENT_CLINIC_OR_DEPARTMENT_OTHER): Payer: 59

## 2022-11-22 ENCOUNTER — Other Ambulatory Visit: Payer: Self-pay

## 2022-11-22 ENCOUNTER — Encounter (HOSPITAL_BASED_OUTPATIENT_CLINIC_OR_DEPARTMENT_OTHER): Payer: Self-pay | Admitting: Emergency Medicine

## 2022-11-22 DIAGNOSIS — I10 Essential (primary) hypertension: Secondary | ICD-10-CM | POA: Insufficient documentation

## 2022-11-22 DIAGNOSIS — R61 Generalized hyperhidrosis: Secondary | ICD-10-CM | POA: Diagnosis not present

## 2022-11-22 DIAGNOSIS — Z1152 Encounter for screening for COVID-19: Secondary | ICD-10-CM | POA: Insufficient documentation

## 2022-11-22 DIAGNOSIS — R072 Precordial pain: Secondary | ICD-10-CM | POA: Diagnosis not present

## 2022-11-22 DIAGNOSIS — R42 Dizziness and giddiness: Secondary | ICD-10-CM | POA: Insufficient documentation

## 2022-11-22 DIAGNOSIS — R0602 Shortness of breath: Secondary | ICD-10-CM | POA: Diagnosis not present

## 2022-11-22 DIAGNOSIS — J45909 Unspecified asthma, uncomplicated: Secondary | ICD-10-CM | POA: Insufficient documentation

## 2022-11-22 HISTORY — DX: Pure hypercholesterolemia, unspecified: E78.00

## 2022-11-22 LAB — CBC WITH DIFFERENTIAL/PLATELET
Abs Immature Granulocytes: 0 10*3/uL (ref 0.00–0.07)
Basophils Absolute: 0.1 10*3/uL (ref 0.0–0.1)
Basophils Relative: 1 %
Eosinophils Absolute: 0 10*3/uL (ref 0.0–0.5)
Eosinophils Relative: 1 %
HCT: 39.7 % (ref 36.0–46.0)
Hemoglobin: 13 g/dL (ref 12.0–15.0)
Immature Granulocytes: 0 %
Lymphocytes Relative: 42 %
Lymphs Abs: 1.4 10*3/uL (ref 0.7–4.0)
MCH: 28.3 pg (ref 26.0–34.0)
MCHC: 32.7 g/dL (ref 30.0–36.0)
MCV: 86.5 fL (ref 80.0–100.0)
Monocytes Absolute: 0.3 10*3/uL (ref 0.1–1.0)
Monocytes Relative: 9 %
Neutro Abs: 1.6 10*3/uL — ABNORMAL LOW (ref 1.7–7.7)
Neutrophils Relative %: 47 %
Platelets: 247 10*3/uL (ref 150–400)
RBC: 4.59 MIL/uL (ref 3.87–5.11)
RDW: 12.4 % (ref 11.5–15.5)
WBC: 3.5 10*3/uL — ABNORMAL LOW (ref 4.0–10.5)
nRBC: 0 % (ref 0.0–0.2)

## 2022-11-22 LAB — COMPREHENSIVE METABOLIC PANEL
ALT: 11 U/L (ref 0–44)
AST: 15 U/L (ref 15–41)
Albumin: 3.8 g/dL (ref 3.5–5.0)
Alkaline Phosphatase: 78 U/L (ref 38–126)
Anion gap: 6 (ref 5–15)
BUN: 12 mg/dL (ref 6–20)
CO2: 24 mmol/L (ref 22–32)
Calcium: 8.8 mg/dL — ABNORMAL LOW (ref 8.9–10.3)
Chloride: 108 mmol/L (ref 98–111)
Creatinine, Ser: 0.86 mg/dL (ref 0.44–1.00)
GFR, Estimated: >60 mL/min (ref >60)
Glucose, Bld: 86 mg/dL (ref 70–99)
Potassium: 3.6 mmol/L (ref 3.5–5.1)
Sodium: 138 mmol/L (ref 135–145)
Total Bilirubin: 0.9 mg/dL (ref 0.3–1.2)
Total Protein: 6.7 g/dL (ref 6.5–8.1)

## 2022-11-22 LAB — RESP PANEL BY RT-PCR (RSV, FLU A&B, COVID)  RVPGX2
Influenza A by PCR: NEGATIVE
Influenza B by PCR: NEGATIVE
Resp Syncytial Virus by PCR: NEGATIVE
SARS Coronavirus 2 by RT PCR: NEGATIVE

## 2022-11-22 LAB — LIPASE, BLOOD: Lipase: 31 U/L (ref 11–51)

## 2022-11-22 LAB — HCG, SERUM, QUALITATIVE: Preg, Serum: NEGATIVE

## 2022-11-22 LAB — D-DIMER, QUANTITATIVE: D-Dimer, Quant: 0.32 ug/mL-FEU (ref 0.00–0.50)

## 2022-11-22 LAB — TROPONIN I (HIGH SENSITIVITY): Troponin I (High Sensitivity): 3 ng/L (ref <18)

## 2022-11-22 MED ORDER — ALBUTEROL SULFATE HFA 108 (90 BASE) MCG/ACT IN AERS: 1.0000 | INHALATION_SPRAY | Freq: Four times a day (QID) | RESPIRATORY_TRACT | 0 refills | Status: AC | PRN

## 2022-11-22 MED ORDER — PREDNISONE 10 MG PO TABS: 40.0000 mg | ORAL_TABLET | Freq: Every day | ORAL | 0 refills | Status: AC

## 2022-11-22 NOTE — ED Notes (Addendum)
Steady gait, up to b/r. Blood sent to lab. IV pending on d-dimer results.  Pt alert, NAD, calm, interactive, steady gait.
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
## 2022-11-22 NOTE — ED Triage Notes (Signed)
Pt has been getting varicose vein injection recently.  She had the last one Tuesday.  Pt having chest pain since Tuesday night, radiating to back.  Pt admits to sob yesterday afternoon.  Some diaphoresis.  Some dizziness.  No known fever.

## 2022-11-22 NOTE — Discharge Instructions (Signed)
You are seen in the emergency department today with chest tightness and shortness of breath.  I suspect that this may be due to some underlying bronchitis or possibly mild asthma.  I am starting on steroid for the next 5 days I have called in a prescription for an albuterol inhaler.  You can use this inhaler as needed every 6 hours.  If you require inhaler use more than this you should be reevaluated in the emergency department.  You should also return if you develop worsening chest pain or otherwise sudden worsening symptoms.

## 2022-11-22 NOTE — ED Notes (Signed)
EDP into room, at BS.  ?

## 2023-04-23 ENCOUNTER — Emergency Department (HOSPITAL_BASED_OUTPATIENT_CLINIC_OR_DEPARTMENT_OTHER)
Admission: EM | Admit: 2023-04-23 | Discharge: 2023-04-24 | Disposition: A | Discharge status: Home / Self Care | Payer: 59 | Attending: Emergency Medicine | Admitting: Emergency Medicine

## 2023-04-23 ENCOUNTER — Other Ambulatory Visit: Payer: Self-pay

## 2023-04-23 ENCOUNTER — Encounter (HOSPITAL_BASED_OUTPATIENT_CLINIC_OR_DEPARTMENT_OTHER): Payer: Self-pay

## 2023-04-23 DIAGNOSIS — G8918 Other acute postprocedural pain: Secondary | ICD-10-CM | POA: Diagnosis present

## 2023-04-23 DIAGNOSIS — R519 Headache, unspecified: Secondary | ICD-10-CM | POA: Insufficient documentation

## 2023-04-23 DIAGNOSIS — Z91010 Allergy to peanuts: Secondary | ICD-10-CM | POA: Diagnosis not present

## 2023-04-23 NOTE — ED Triage Notes (Signed)
Had a tooth pulled last Thursday and now she has increased pain to left side of face.   Has taken OTC meds and Percocet and nothing is relieving pain +swelling noted to left side of face

## 2023-04-24 DIAGNOSIS — G8918 Other acute postprocedural pain: Secondary | ICD-10-CM | POA: Diagnosis not present

## 2023-04-24 MED ORDER — AMOXICILLIN-POT CLAVULANATE 875-125 MG PO TABS: 1.0000 | ORAL_TABLET | Freq: Two times a day (BID) | ORAL | 0 refills | Status: AC

## 2023-04-24 MED ORDER — AMOXICILLIN-POT CLAVULANATE 875-125 MG PO TABS
1.0000 | ORAL_TABLET | Freq: Once | ORAL | Status: AC
  Administered 2023-04-24: 1 via ORAL
  Filled 2023-04-24: qty 1

## 2023-04-24 NOTE — ED Provider Notes (Signed)
[facility]  Provider Note  CSN: [id number] Arrival date & time: 04/23/23 2112  History Chief Complaint  Patient presents with   Dental Pain    Kathy Hodge is a 52 y.o. female is about a week post dental extraction of tooth #15. She reports continued pain on the left side of her face. She saw her dentist in follow up who thought there might be some inflammation in a nerve, but did not see dry socket. She has been taking pain medications prescribed by her dentist without relief. No fever or drainage.    Home Medications Prior to Admission medications   Medication Sig Start Date End Date Taking? Authorizing Provider  amoxicillin-clavulanate (AUGMENTIN) 875-125 MG tablet Take 1 tablet by mouth every 12 (twelve) hours. 04/24/23  Yes Pollyann Savoy, MD  albuterol (VENTOLIN HFA) 108 (90 Base) MCG/ACT inhaler Inhale 1-2 puffs into the lungs every 6 (six) hours as needed for wheezing or shortness of breath. 11/22/22   Long, Arlyss Repress, MD  ALPRAZolam Prudy Feeler) 0.5 MG tablet Take 0.5 mg by mouth as needed for anxiety. 11/04/22   [provider]  cyanocobalamin (VITAMIN B12) 1000 MCG tablet Take 1,000 mcg by mouth daily. 01/22/21   [provider]  EPINEPHrine 0.3 mg/0.3 mL IJ SOAJ injection Inject 0.3 mg into the muscle as needed. 02/06/22   Verlee Monte, MD  fluticasone (FLONASE) 50 MCG/ACT nasal spray Place 1 spray into both nostrils 2 (two) times daily as needed (nasal congestion). Patient not taking: Reported on 11/22/2022 02/06/22   Verlee Monte, MD  gabapentin (NEURONTIN) 400 MG capsule Take 400 mg by mouth 3 (three) times daily.    [provider]  ibuprofen (ADVIL) 600 MG tablet Take 600 mg by mouth as needed (pain).    [provider]  levocetirizine (XYZAL) 5 MG tablet Take 1 tablet (5 mg total) by mouth every evening. Patient taking differently: Take 5 mg by mouth as needed for allergies. 08/01/22    Verlee Monte, MD  mometasone-formoterol [facility]) 100-5 MCG/ACT AERO Inhale 2 puffs into the lungs in the morning and at bedtime. with spacer and rinse mouth afterwards. 02/06/22   Verlee Monte, MD  montelukast (SINGULAIR) 10 MG tablet TAKE 1 TABLET(10 MG) BY MOUTH AT BEDTIME Patient taking differently: Take 10 mg by mouth at bedtime. 07/29/22   Verlee Monte, MD  naloxone [facility]) nasal spray 4 mg/0.1 mL Place 1 spray into the nose once.    [provider]  omeprazole (PRILOSEC) 40 MG capsule Take 1 capsule (40 mg total) by mouth daily. Take 30 minutes prior to breakfast or any meals. 02/06/22   Verlee Monte, MD  ondansetron (ZOFRAN-ODT) 4 MG disintegrating tablet Take 1 tablet (4 mg total) by mouth every 8 (eight) hours as needed for nausea or vomiting. Patient not taking: Reported on 11/22/2022 08/07/21   Placido Sou, PA-C  oxyCODONE-acetaminophen (PERCOCET) 10-325 MG tablet Take 1 tablet by mouth 4 (four) times daily as needed for pain.    [provider]  phentermine (ADIPEX-P) 37.5 MG tablet Take 37.5 mg by mouth daily. 11/08/22   [provider]  tiZANidine (ZANAFLEX) 4 MG tablet TAKE 1 TABLET BY MOUTH EVERY 6 HOURS AS NEEDED FOR MUSCLE SPASMS Patient taking differently: Take 4 mg by mouth every 6 (six) hours as needed for muscle spasms. 09/13/19   Beverely Low, MD  topiramate (TOPAMAX) 25 MG tablet Take 100-125 mg by  mouth See admin instructions. Take 4 tablets (100 mg) in the morning and 5 tablets (125 mg) at night. 03/29/22   [provider]  zolpidem (AMBIEN) 10 MG tablet 1 p.o. nightly Patient taking differently: Take 10 mg by mouth at bedtime. 11/16/19   Tomma Lightning, MD     Allergies    Other, Peanut-containing drug products, Sesame seed (diagnostic), and Peanut oil   Review of Systems   Review of Systems Please see HPI for pertinent positives and negatives  Physical Exam BP (!) 177/75 (BP Location: Left Arm)   Pulse 64   Temp 98  F (36.7 C)   Resp 18   Ht 5\' 9"  (1.753 m)   Wt 81.6 kg   SpO2 100%   BMI 26.58 kg/m   Physical Exam Vitals and nursing note reviewed.  HENT:     Head: Normocephalic.     Left Ear: Tympanic membrane and ear canal normal.     Nose: Nose normal.     Mouth/Throat:     Comments: S/p dental extraction, soft tissue swelling to the gum without erythema or drainage. no signs of deep tissue infection Eyes:     Extraocular Movements: Extraocular movements intact.  Pulmonary:     Effort: Pulmonary effort is normal.  Musculoskeletal:        General: Normal range of motion.     Cervical back: Neck supple.  Skin:    Findings: No rash (on exposed skin).  Neurological:     Mental Status: She is alert and oriented to person, place, and time.  Psychiatric:        Mood and Affect: Mood normal.     ED Results / Procedures / Treatments   EKG None  Procedures Procedures  Medications Ordered in the ED Medications  amoxicillin-clavulanate (AUGMENTIN) 875-125 MG per tablet 1 tablet (has no administration in time range)    Initial Impression and Plan  Patient here with persistent pain after dental extraction. She does not have any signs of deep tissue infection/abscess. Will give a course of abx as a precaution. Recommend she continue pain medication and follow up with her dentist if not improving.   ED Course       MDM Rules/Calculators/A&P Medical Decision Making Problems Addressed: Post-op pain: acute illness or injury  Risk Prescription drug management.     Final Clinical Impression(s) / ED Diagnoses Final diagnoses:  Post-op pain    Rx / DC Orders ED Discharge Orders          Ordered    amoxicillin-clavulanate (AUGMENTIN) 875-125 MG tablet  Every 12 hours        04/24/23 0027             Pollyann Savoy, MD 04/24/23 [id number]

## 2023-06-23 NOTE — Progress Notes (Signed)
  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 In light of the patient's history and examination, it is my professional opinion that occipital nerve blockade is a safe, effective therapy and is medically necessary.   Patient's current Headache Provider: Dr. Derinda Sor and Corean Netter, NP  Last Appt: 05/13/23 Next Appt: 11/11/23  Number of prior Procedures completed: today 1- 07/03/23 Started: 07/03/23   ONB Schedule: PRN    Patient Questionnaire:  Did your prior injection result in meaningful improvement in your headache? first injection What was the benefit? first injection Focusing ONLY on the reason why you received the injections, please grade your response:  first injection  After how many weeks did the effect of your injection wear off? NA    Consent: Indication, risks, benefits, and alternatives discussed with patient, including risk of bleeding, infection, permanent numbness, and medication reaction.   Written consent signed by patient - 07/03/23  Medication: Ropivacaine 0.5%  and Lidocaine  1%  Location: The greater occipital nerve was located by first palpating the mastoid and occipital protruberance, or inion.  The nerve was identified at 2/3 the distance between these two landmarks along the occipital ridge.  It was coincident with site of maximal tenderness. The lesser occipital nerve was palpated retroauricularly at about 1/3 of the distance from the mastoid to the inion, coincident  with site of maximal tenderness.   Technique: The areas were cleansed with 2 alcohol swabs while using gloves.  Using a 1 cc syringe and a 30 gauge 1/2 inch needle, 1 cc of the medication mixture was injected into multiple tissue planes in the area around each greater and lesser occipital nerve (location above).  Prior to each injection the plunger was drawn back to ensure that the needle was not in a blood vessel.  The patient tolerated the procedure well.  2 injection sites per side (greater and lesser occipital nerve) 4 injections total were administered   Complications: None Blood loss: <1 cc     Leita Abbey FNP-C Headache Practitioner [facility]) Adult and Pediatric Neurology  [facility]  [place], [place]  Phone: [phone]  Fax:   [phone]   Administrations This Visit     lidocaine  (XYLOCAINE ) 10 mg/mL (1 %) injection 20 mg     Admin Date 07/03/2023 Action Given Dose 2 mL Route Other Documented By Renea Arna Ply, CMA         ROPivacaine PF (NAROPIN) 5 mg/mL (0.5 %) injection 10 mg     Admin Date 07/03/2023 Action Given Dose 2 mL Route Other Documented By Renea Arna Ply, CMA

## 2023-08-29 NOTE — Progress Notes (Addendum)
 [facility]  Date/Time: 08/29/2023 10:30 AM  Performed by: Corean Netter, NP Authorized by: Corean Netter, NP     [facility] Neurology Procedure note   Date: 08/29/2023 Patient: Kathy Hodge DOB:  [birth year]  Procedure: Botox injections (PREEMPT protocol) - q12 weeks   Indications: G43.719 Chronic Migraine without aura, intractable, without status migranosus  Botox - Medical Necessity  Patient fulfills FDA criteria for chronic migraine with chronic daily headaches > 15 days per month lasting > 4 hours for greater than 3 months not responding to usual acute treatments or preventive treatments. This patient has failed several usual customary preventives in several classes of drugs. Botox is FDA approved for Chronic Migraine, medically necessary and recommended.  Receiving Botox injections since: 06/06/2023 Headache Clinic Provider: Corean Netter, NP  Last clinic Appt: 05/13/2023 Next clinic Appt: 09/26/2023    Date  Procedure   MIDAS  HA days  HA intensity  06/06/2023 Botox 1-155 units 54 45 10  08/29/2023 Botox 2-200 units 95 31 10                     Subjective: Subjective: Patient reports that headache severity has been reduced due to use of onabotulinumtoxin A.    Risk and benefits were explained to the patient.  Written consent was obtained - 06/06/2023  Time out was performed  OnabotulinumtoxinA was reconstituted with 0.9% NaCl to create a dilution of 5 units per 0.8mL.  Position: Injections to the face , frontalis and temporalis were administered with the patient laying down and the second half of the procedure was preformed for the patient while they are sitting in a chair.    Each injection site was sterilized with 70% isopropyl alcohol.  Injections were administered with a 30 gauge, 1/2 needle.  Injections administered as follows:   5 units were injected in the procerus  muscle  10 units were divided bilaterally in the medial corrugators in 2 sites  20 units were injected bilaterally in the frontalis muscles in 4 sites  40 units were injected bilaterally in the temporalis muscles in 8 sites.  30 units were divided bilaterally in the two occipitalis muscles in 6 sites  20 units were injected bilaterally in the cervical paraspinlais muscles in 4 sites  30 units were injected bilaterally in the trapezius muscles in 6 sites   Additional 45 units was administered over 8 sites per the follow the pain portion of the PREEMPT protocol   5 units over 1 sites in the left occipitalis muscle  5 units over 1  sites in the right occipitalis muscle  10 units over 2  sites in the left trapezius muscle  10 units over 2  sites in the right trapezius muscle  7.5 units over 1  sites in the left temporalis muscle  7.5 units over 1 site in the right temporalis muscle.   Total units used= 200 Total injection sites=39  Patient was injected with 200 units and 0 units were wasted/discarded    Administrations This Visit     onabotulinumtoxin type A (BOTOX) injection 200 Units     Admin Date 08/29/2023 Action Given Dose 200 Units Route intramuscular Documented By Verneita Gearing, CMA              The patient tolerated the procedure without any immediate complications.     Migraine Disability Assessment (MIDAS)  # of days in the past 3 months  1. Missed work / school because of HA 3  2. Productivity at work / school reduced by > half because of HA (do not count days from Q.1) 1  3. Did not do housework because of HA 32  4. Productivity in household work reduced by > half because of HA (do not count days from Q.3) 58  5. Missed family / social / leisure activities because of HA 1  Total 95  MIDAS grade (use total of Q1 to 5) I: 0-5, little to no disability II: 6-10, mild disability III: 11-20, moderate disability IV: 21+, severe disability   A. # of days  in the last 3 months with a HA (count each day if HA lasted > 1 day) 31  B. Average HA intensity (0-10) 10    Patient Botox Questionnaire:  **Migraine = moderate to severe headache intensity; ranging 4-10 on a 10 point scale  1. Number of Migraine days per month PRIOR to receiving Botox treatment: 18/30  2. Number of migraine days per month AFTER receiving Botox treatment: 16/30   3. Number of hours a migraine lasted PRIOR to receiving Botox treatment: 24 4. Number of hours migraine last AFTER receiving Botox treatment: 18 5. Do you experience a wearing off effect where headaches are more bothersome leading up to your Botox appointment Yes. If So how many days/weeks prior to the next Botox appointment : 7 days    Corean JINNY Netter, NP-C  [facility] Department of Neurology [facility]  Sleep Medicine Clinic  Phone: [phone]  Fax:   [phone]

## 2023-09-11 ENCOUNTER — Emergency Department (HOSPITAL_COMMUNITY): Payer: 59

## 2023-09-11 ENCOUNTER — Emergency Department (HOSPITAL_COMMUNITY)
Admission: EM | Admit: 2023-09-11 | Discharge: 2023-09-11 | Disposition: A | Discharge status: Home / Self Care | Payer: 59 | Attending: Emergency Medicine | Admitting: Emergency Medicine

## 2023-09-11 ENCOUNTER — Other Ambulatory Visit: Payer: Self-pay

## 2023-09-11 ENCOUNTER — Encounter (HOSPITAL_COMMUNITY): Payer: Self-pay

## 2023-09-11 DIAGNOSIS — R519 Headache, unspecified: Secondary | ICD-10-CM | POA: Diagnosis present

## 2023-09-11 DIAGNOSIS — R110 Nausea: Secondary | ICD-10-CM | POA: Diagnosis not present

## 2023-09-11 DIAGNOSIS — Z91010 Allergy to peanuts: Secondary | ICD-10-CM | POA: Insufficient documentation

## 2023-09-11 DIAGNOSIS — G43809 Other migraine, not intractable, without status migrainosus: Secondary | ICD-10-CM

## 2023-09-11 DIAGNOSIS — G43909 Migraine, unspecified, not intractable, without status migrainosus: Secondary | ICD-10-CM | POA: Insufficient documentation

## 2023-09-11 LAB — BASIC METABOLIC PANEL
Anion gap: 6 (ref 5–15)
BUN: 9 mg/dL (ref 6–20)
CO2: 24 mmol/L (ref 22–32)
Calcium: 9.2 mg/dL (ref 8.9–10.3)
Chloride: 109 mmol/L (ref 98–111)
Creatinine, Ser: 0.75 mg/dL (ref 0.44–1.00)
GFR, Estimated: >60 mL/min (ref >60)
Glucose, Bld: 85 mg/dL (ref 70–99)
Potassium: 3.5 mmol/L (ref 3.5–5.1)
Sodium: 139 mmol/L (ref 135–145)

## 2023-09-11 LAB — CBC WITH DIFFERENTIAL/PLATELET
Abs Immature Granulocytes: 0.01 10*3/uL (ref 0.00–0.07)
Basophils Absolute: 0.1 10*3/uL (ref 0.0–0.1)
Basophils Relative: 1 %
Eosinophils Absolute: 0.1 10*3/uL (ref 0.0–0.5)
Eosinophils Relative: 2 %
HCT: 37.1 % (ref 36.0–46.0)
Hemoglobin: 12.2 g/dL (ref 12.0–15.0)
Immature Granulocytes: 0 %
Lymphocytes Relative: 50 %
Lymphs Abs: 1.9 10*3/uL (ref 0.7–4.0)
MCH: 29.3 pg (ref 26.0–34.0)
MCHC: 32.9 g/dL (ref 30.0–36.0)
MCV: 89.2 fL (ref 80.0–100.0)
Monocytes Absolute: 0.4 10*3/uL (ref 0.1–1.0)
Monocytes Relative: 10 %
Neutro Abs: 1.4 10*3/uL — ABNORMAL LOW (ref 1.7–7.7)
Neutrophils Relative %: 37 %
Platelets: 229 10*3/uL (ref 150–400)
RBC: 4.16 MIL/uL (ref 3.87–5.11)
RDW: 12.5 % (ref 11.5–15.5)
WBC: 3.8 10*3/uL — ABNORMAL LOW (ref 4.0–10.5)
nRBC: 0 % (ref 0.0–0.2)

## 2023-09-11 MED ORDER — KETOROLAC TROMETHAMINE 60 MG/2ML IM SOLN
30.0000 mg | Freq: Once | INTRAMUSCULAR | Status: AC
  Administered 2023-09-11: 30 mg via INTRAMUSCULAR
  Filled 2023-09-11: qty 2

## 2023-09-11 MED ORDER — METOCLOPRAMIDE HCL 5 MG/ML IJ SOLN: 10.0000 mg | Freq: Once | INTRAMUSCULAR | Status: DC

## 2023-09-11 MED ORDER — KETOROLAC TROMETHAMINE 30 MG/ML IJ SOLN: 30.0000 mg | Freq: Once | INTRAMUSCULAR | Status: DC

## 2023-09-11 MED ORDER — PROCHLORPERAZINE EDISYLATE 10 MG/2ML IJ SOLN
10.0000 mg | Freq: Once | INTRAMUSCULAR | Status: AC
  Administered 2023-09-11: 10 mg via INTRAVENOUS
  Filled 2023-09-11: qty 2

## 2023-09-11 MED ORDER — DIPHENHYDRAMINE HCL 50 MG/ML IJ SOLN: 25.0000 mg | Freq: Once | INTRAMUSCULAR | Status: DC

## 2023-09-11 NOTE — ED Provider Notes (Signed)
 [place] EMERGENCY DEPARTMENT AT [facility] Provider Note   CSN: [id number] Arrival date & time: 09/11/23  1810     History  Chief Complaint  Patient presents with   Migraine    Kathy Hodge is a 53 y.o. female.  53 year old female history of chronic migraines presenting for left sided primarily occipital headache since yesterday. States this morning she felt pain on the left side of her face as well so she visited PCP who recommended ED eval. Received toradol  at PCP without relief. She takes topamax  but this did not help. Follows w/ neurology outpatient for botox injections, scheduled to see them again later in January.  Endorses mild nausea and photosensitivity. Denies recent fevers, chills, cough/cold, CP/SOB, abdominal pain. Denies weakness or numbness in arms/legs.   Migraine Associated symptoms include headaches. Pertinent negatives include no chest pain, no abdominal pain and no shortness of breath.       Home Medications Prior to Admission medications   Medication Sig Start Date End Date Taking? Authorizing Provider  albuterol  (VENTOLIN  HFA) 108 (90 Base) MCG/ACT inhaler Inhale 1-2 puffs into the lungs every 6 (six) hours as needed for wheezing or shortness of breath. 11/22/22   Long, Joshua G, MD  ALPRAZolam (XANAX) 0.5 MG tablet Take 0.5 mg by mouth as needed for anxiety. 11/04/22   [provider]  amoxicillin -clavulanate (AUGMENTIN ) 875-125 MG tablet Take 1 tablet by mouth every 12 (twelve) hours. 04/24/23   Roselyn Carlin NOVAK, MD  cyanocobalamin (VITAMIN B12) 1000 MCG tablet Take 1,000 mcg by mouth daily. 01/22/21   [provider]  EPINEPHrine  0.3 mg/0.3 mL IJ SOAJ injection Inject 0.3 mg into the muscle as needed. 02/06/22   Marinda Rocky SAILOR, MD  fluticasone  (FLONASE ) 50 MCG/ACT nasal spray Place 1 spray into both nostrils 2 (two) times daily as needed (nasal congestion). Patient not taking: Reported on 11/22/2022 02/06/22   Marinda Rocky SAILOR,  MD  gabapentin (NEURONTIN) 400 MG capsule Take 400 mg by mouth 3 (three) times daily.    [provider]  ibuprofen  (ADVIL ) 600 MG tablet Take 600 mg by mouth as needed (pain).    [provider]  levocetirizine (XYZAL ) 5 MG tablet Take 1 tablet (5 mg total) by mouth every evening. Patient taking differently: Take 5 mg by mouth as needed for allergies. 08/01/22   Marinda Rocky SAILOR, MD  mometasone-formoterol (DULERA ) 100-5 MCG/ACT AERO Inhale 2 puffs into the lungs in the morning and at bedtime. with spacer and rinse mouth afterwards. 02/06/22   Marinda Rocky SAILOR, MD  montelukast  (SINGULAIR ) 10 MG tablet TAKE 1 TABLET(10 MG) BY MOUTH AT BEDTIME Patient taking differently: Take 10 mg by mouth at bedtime. 07/29/22   Marinda Rocky SAILOR, MD  naloxone [facility]) nasal spray 4 mg/0.1 mL Place 1 spray into the nose once.    [provider]  omeprazole  (PRILOSEC) 40 MG capsule Take 1 capsule (40 mg total) by mouth daily. Take 30 minutes prior to breakfast or any meals. 02/06/22   Marinda Rocky SAILOR, MD  ondansetron  (ZOFRAN -ODT) 4 MG disintegrating tablet Take 1 tablet (4 mg total) by mouth every 8 (eight) hours as needed for nausea or vomiting. Patient not taking: Reported on 11/22/2022 08/07/21   Joldersma, Logan, PA-C  oxyCODONE -acetaminophen  (PERCOCET) 10-325 MG tablet Take 1 tablet by mouth 4 (four) times daily as needed for pain.    [provider]  phentermine (ADIPEX-P) 37.5 MG tablet Take 37.5 mg by mouth daily. 11/08/22  [provider]  tiZANidine  (ZANAFLEX ) 4 MG tablet TAKE 1 TABLET BY MOUTH EVERY 6 HOURS AS NEEDED FOR MUSCLE SPASMS Patient taking differently: Take 4 mg by mouth every 6 (six) hours as needed for muscle spasms. 09/13/19   Kay Kemps, MD  topiramate  (TOPAMAX ) 25 MG tablet Take 100-125 mg by mouth See admin instructions. Take 4 tablets (100 mg) in the morning and 5 tablets (125 mg) at night. 03/29/22   [provider]  zolpidem  (AMBIEN ) 10 MG tablet 1  p.o. nightly Patient taking differently: Take 10 mg by mouth at bedtime. 11/16/19   Neda Jennet LABOR, MD      Allergies    Other, Peanut-containing drug products, Sesame seed (diagnostic), and Peanut oil    Review of Systems   Review of Systems  Constitutional:  Negative for fatigue and fever.  Respiratory:  Negative for cough and shortness of breath.   Cardiovascular:  Negative for chest pain and leg swelling.  Gastrointestinal:  Positive for nausea. Negative for abdominal pain.  Neurological:  Positive for headaches. Negative for dizziness, facial asymmetry, weakness, light-headedness and numbness.    Physical Exam Updated Vital Signs BP (!) 142/88   Pulse 65   Temp 97.7 F (36.5 C) (Oral)   Resp 18   Ht 5' 9 (1.753 m)   Wt 83 kg   SpO2 100%   BMI 27.02 kg/m  Physical Exam Constitutional:      General: She is not in acute distress. HENT:     Head: Normocephalic and atraumatic.     Mouth/Throat:     Mouth: Mucous membranes are moist.     Pharynx: Oropharynx is clear. No oropharyngeal exudate or posterior oropharyngeal erythema.  Eyes:     Extraocular Movements: Extraocular movements intact.     Conjunctiva/sclera: Conjunctivae normal.     Pupils: Pupils are equal, round, and reactive to light.  Cardiovascular:     Rate and Rhythm: Normal rate and regular rhythm.     Heart sounds: Normal heart sounds. No murmur heard. Pulmonary:     Effort: Pulmonary effort is normal. No respiratory distress.     Breath sounds: Normal breath sounds.  Abdominal:     General: Abdomen is flat. There is no distension.     Palpations: Abdomen is soft.     Tenderness: There is no abdominal tenderness.  Musculoskeletal:        General: No swelling.     Cervical back: Normal range of motion and neck supple.     Right lower leg: No edema.     Left lower leg: No edema.  Skin:    General: Skin is warm and dry.  Neurological:     General: No focal deficit present.     Mental Status:  She is alert. Mental status is at baseline.     Cranial Nerves: Cranial nerves 2-12 are intact. No cranial nerve deficit or facial asymmetry.     Sensory: Sensation is intact. No sensory deficit.     Motor: Motor function is intact. No weakness.     ED Results / Procedures / Treatments   Labs (all labs ordered are listed, but only abnormal results are displayed) Labs Reviewed  CBC WITH DIFFERENTIAL/PLATELET - Abnormal; Notable for the following components:      Result Value   WBC 3.8 (*)    Neutro Abs 1.4 (*)    All other components within normal limits  BASIC METABOLIC PANEL    EKG None  Radiology  CT Head Wo Contrast Result Date: 09/11/2023 CLINICAL DATA:  Headache EXAM: CT HEAD WITHOUT CONTRAST TECHNIQUE: Contiguous axial images were obtained from the base of the skull through the vertex without intravenous contrast. RADIATION DOSE REDUCTION: This exam was performed according to the departmental dose-optimization program which includes automated exposure control, adjustment of the mA and/or kV according to patient size and/or use of iterative reconstruction technique. COMPARISON:  08/07/2021 FINDINGS: Brain: No acute intracranial abnormality. Specifically, no hemorrhage, hydrocephalus, mass lesion, acute infarction, or significant intracranial injury. Vascular: No hyperdense vessel or unexpected calcification. Skull: No acute calvarial abnormality. Sinuses/Orbits: No acute findings Other: None IMPRESSION: No acute intracranial abnormality. Electronically Signed   By: Franky Crease M.D.   On: 09/11/2023 19:22    Procedures Procedures    Medications Ordered in ED Medications - No data to display  ED Course/ Medical Decision Making/ A&P                                 Medical Decision Making 52 year old female with history of chronic migraines presenting with acute migraine x 1 day.  No significant neurologic deficits or red flags on history or exam.  CT head without contrast  negative for acute intracranial pathology, CBC and BMP unremarkable.  She follows closely with neurology outpatient with whom she receives Botox injections.  Will treat current migraine with IM Toradol  and Compazine , advised close follow-up with neurology and PCP.  Discussed return precautions.  Patient stable for discharge.  Risk Prescription drug management.          Final Clinical Impression(s) / ED Diagnoses Final diagnoses:  None    Rx / DC Orders ED Discharge Orders     None         Romelle Booty, MD 09/11/23 2241    Patt Alm Macho, MD 09/12/23 0001

## 2023-09-11 NOTE — Discharge Instructions (Addendum)
 You received some medications for your migraine in the ED. Your CT scan and bloodwork did not show any cause for your pain.   Please follow up closely with your neurologist. Please seek medical attention if you start having any weakness or numbness in your arms or legs, facial droop, or trouble speaking.

## 2023-09-11 NOTE — ED Triage Notes (Signed)
 Patient has had sharp migraines since yesterday on the left side. Pain moves down into her left ear. Feels nauseous. Took topamax, no relief.

## 2023-09-11 NOTE — ED Provider Triage Note (Signed)
 Emergency Medicine Provider Triage Evaluation Note  Kathy Hodge , a 53 y.o. female  was evaluated in triage.  Pt complains of headache. Hx of migraine.  Having progressive worsening L sided headache, light/sound sensitivity and nausea since yesterday unrelieved with her medicine.  Seen by PCP today, got treatment but no improvement.  Pcp recommend coming to the ER for further care. No fever, chills, rash, neck stiffness, focal weakness  Review of Systems  Positive: As above Negative: As bove  Physical Exam  BP (!) 150/92   Pulse 68   Temp 98 F (36.7 C)   Resp 16   Ht 5' 9 (1.753 m)   Wt 83 kg   SpO2 100%   BMI 27.02 kg/m  Gen:   Awake, no distress   Resp:  Normal effort  MSK:   Moves extremities without difficulty  Other:    Medical Decision Making  Medically screening exam initiated at 6:38 PM.  Appropriate orders placed.  Kathy Hodge was informed that the remainder of the evaluation will be completed by another provider, this initial triage assessment does not replace that evaluation, and the importance of remaining in the ED until their evaluation is complete.     Nivia Colon, PA-C 09/11/23 1839

## 2023-09-25 NOTE — Progress Notes (Signed)
  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 In light of the patient's history and examination, it is my professional opinion that occipital nerve blockade is a safe, effective therapy and is medically necessary.  Patient's current Headache Provider: Dr. Derinda Sor and Corean Netter, NP  Last Appt: 05/13/23 Next Appt: 11/11/23  Number of prior Procedures completed: today 1- 07/03/23 Started: 07/03/23   ONB Schedule: PRN   Patient Questionnaire:  Did your prior injection result in meaningful improvement in your headache? first injection What was the benefit? first injection Focusing ONLY on the reason why you received the injections, please grade your response:  first injection  After how many weeks did the effect of your injection wear off? NA   Consent: Indication, risks, benefits, and alternatives discussed with patient, including risk of bleeding, infection, permanent numbness, and medication reaction.   Written consent signed by patient - 07/03/23  Medication: Ropivacaine 0.5%  and Lidocaine  1%  Location: The greater occipital nerve was located by first palpating the mastoid and occipital protruberance, or inion.  The nerve was identified at 2/3 the distance between these two landmarks along the occipital ridge.  It was coincident with site of maximal tenderness. The lesser occipital nerve was palpated retroauricularly at about 1/3 of the distance from the mastoid to the inion, coincident with site  of maximal tenderness.   Technique: The areas were cleansed with 2 alcohol swabs while using gloves.  Using a 1 cc syringe and a 30 gauge 1/2 inch needle, 1 cc of the medication mixture was injected into multiple tissue planes in the area around each greater and lesser occipital nerve (location above).  Prior to each injection the plunger was drawn back to ensure that the needle was not in a blood vessel.  The patient tolerated the procedure well.  2 injection sites per side (greater and lesser occipital nerve) 4 injections total were administered   Complications: None Blood loss: <1 cc  Administrations This Visit     lidocaine  (XYLOCAINE ) 10 mg/mL (1 %) injection 20 mg     Admin Date 09/26/2023 Action Given Dose 2 mL Route injection Documented By Freddy Amend, CMA         ROPivacaine PF (NAROPIN) 5 mg/mL (0.5 %) injection 10 mg     Admin Date 09/26/2023 Action Given Dose 2 mL Route injection Documented By Freddy Amend, CMA              Corean JINNY Netter, NP-C  [facility] Department of Neurology [facility]  [facility] Medicine Clinic  Phone: [phone]  Fax:   [phone]

## 2023-10-27 NOTE — Progress Notes (Signed)
  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 In light of the patient's history and examination, it is my professional opinion that occipital nerve blockade is a safe, effective therapy and is medically necessary.  Patient's current Headache Provider: Dr. Derinda Sor and Corean Netter, NP  Last Appt: 05/13/23 Next Appt: 11/11/23  Number of prior Procedures completed: today 1- 07/03/23 Started: 07/03/23   ONB Schedule: PRN   Patient Questionnaire:  Did your prior injection result in meaningful improvement in your headache? first injection What was the benefit? first injection Focusing ONLY on the reason why you received the injections, please grade your response:  first injection  After how many weeks did the effect of your injection wear off? NA   Consent: Indication, risks, benefits, and alternatives discussed with patient, including risk of bleeding, infection, permanent numbness, and medication reaction.   Written consent signed by patient - 07/03/23  Medication: Ropivacaine 0.5%  and Lidocaine  1%  Location: The greater occipital nerve was located by first palpating the mastoid and occipital protruberance, or inion.  The nerve was identified at 2/3 the distance between these two landmarks along the occipital ridge.  It was coincident with site of maximal tenderness. The lesser occipital nerve was palpated retroauricularly at about 1/3 of the distance from the mastoid to the inion, coincident with site  of maximal tenderness.   Technique: The areas were cleansed with 2 alcohol swabs while using gloves.  Using a 1 cc syringe and a 30 gauge 1/2 inch needle, 1 cc of the medication mixture was injected into multiple tissue planes in the area around each greater and lesser occipital nerve (location above).  Prior to each injection the plunger was drawn back to ensure that the needle was not in a blood vessel.  The patient tolerated the procedure well.  2 injection sites per side (greater and lesser occipital nerve) 4 injections total were administered   Complications: None Blood loss: <1 cc  Administrations This Visit     lidocaine  (XYLOCAINE ) 10 mg/mL (1 %) injection 20 mg     Admin Date 10/31/2023 Action Given Dose 2 mL Route injection Documented By Freddy Amend, CMA         ROPivacaine PF (NAROPIN) 5 mg/mL (0.5 %) injection 10 mg     Admin Date 10/31/2023 Action Given Dose 2 mL Route injection Documented By Freddy Amend, CMA               Corean JINNY Netter, NP-C  [facility] Department of Neurology [facility]  Sleep Medicine Clinic  Phone: [phone]  Fax:   [phone]

## 2023-11-28 NOTE — Telephone Encounter (Signed)
 Patient is calling to reschedule her Botox appointment.    #: [phone]

## 2024-01-12 NOTE — Progress Notes (Signed)
 HPI:  Kathy Hodge is seen in follow up.She reports that her headaches are up and down, but not as bad in the past.Reports pain to be in her posterior head region and is worse when she lies down and her head touches the pillow, reports tenderness inher occipitalis areas. She reports that she sleeps on her side. She had few ONB's. But states today that they work for a few days and then would stop working.Reports that she has about 20 days a month, but 3-4 of them are severe and are associated with photophobia, nausea.She reports taking emgality, but is not taking gabapentin as she reports that makes her sleepy.  She had a HST that revealed AHI  Of 2.9 , but had significant desaturations with oxygen saturation below 90% for 115 mins and <85 for 80 mins . She was scheduled for an inlab sleep study but did not keep that appointment.She has lost weight since She had tried gabapentin, lyrica,sertraline, seroquel,amlodipiine, baclofen ,tizanidine , topamax ,botox  all are ineffective.She reports that she was on BP meds , but since weight loss she has light headed feeling esp while standing and is now off antihypertensives, hence do not feel that betablockers, Ca channel blockers are great options. Reports consuming 2-3 cups of coffee .Takes oxycoodne 1-2 per day and has been taking thsi for 2 years for her overall aches /pains.She reports right shoudler pain and is scheduled to have surgery done the first week in June.  She had an MRI Brain done 2022: IMPRESSION:     1.  No acute intracranial abnormality.  2.  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 3.  No evidence of intracranial hypotension.     My notes from 10/09/2020   Kathy Hodge is seen in f/u.Was followed by neurology at [facility], but was asked to follow up here. She reports that she did well without any significant headaches  after initiation of emgality .However she tested positive for Covid in December 2021.Had nasal congestion as well as headaches and since then has daily headaches.Reports sharp pains that can be in her temporal locations on either side or radiation of pain from her left posterior temporal area to her left posterior head region , she has been just relaxing and sleeping it off , but for the past 3-4 weeks has required taking motrin  and has been taking motrin  daily for the last 2 weeks.She reports associated photo, phonophobia.Sometimes pain is so strong 'that  Have to hold my head in my hand for few minutes and feels that this is different from her previous headaches.She reports lightheadedness when getting up quickly at times  which subsides when she sits down.She denies any positional component with headaches.When she has a severe headache she would like to lie down in a quiet dark room, but sitting or standing does not worsen her headaches.She also stated that she had tried a prednisone  taper few weeks ago and was not effective.These headaches are daily and can last the whole day She had tried gabapentin, lyrica,sertraline, seroquel,amlodipiine, baclofen tizanidine , topamax  and all are ineffective.She reports that she was on BP meds , but since weight loss she has light headed feeling esp while standing and is now off antihypertensives, hence do not feel that betablockers, Ca channel blockers are great options. Since her last visit she had a failed spinal tap, had a T Spine MRI done  , final read 1.  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 This results in ventral displacement of the thecal sac without deformation or signal abnormality of the spinal cord. Bony spurring along the anterior aspect of the left T8-T9 facet with abutment of the dorsal thecal sac, which may reflect a candidate site for possible dural defect. Consider CT myelogram for further  evaluation. 2.  Otherwise, no significant spondylosis of the thoracic spine.   She is followed at [facility] for chronic neck and back pain, she reports that she had an MRI C Spine and was recommended ? ESI and was told that would also help her headaches, but she is reluctant to proceed. We asked that she gets reports of her MRI C Spine to us  She has a H/O OSA  But reports that she is not using CPAP , states that she has lost about 40 lbs since her last sleep study(states that she weighed 252 lbs then and is now 203 lbs), she does not know if she snores, denies waking up gasping for breath or any witnessed apneas and does not feel that she has sleep apnea.Is interested in getting tested again to know if she has sleep apnea. She has been taking oxycodone  2 tabs qod for the past year or more fr chronic back pain.She is on topamax  prescribed by her pain specialist. She is s/p hysterectomy.  Denies any other new neuro symptoms.     She had a repeat MRI T Spine 12/25/2020     CONCLUSION:     1.  Decreased, thin residual dorsal epidural fluid collection spanning the levels of T2-T11 without substantial mass effect. This may relate to CSF leak or a prior postprocedural collection. If there is clinical concern for intracranial hypotension, CT myelogram could be considered to assess for potential site of CSF leak. 2.  No substantial thoracic spinal canal or neural foraminal stenosis.   She does not want to proceed with any spinal taps again. She had her last eye check up about a year ago.Reports that she may have blurry vision occasionally but not regularly.       ALLERGIES: Nut - unspecified, Sesame seed, Tree nut, Peanut, and Povidone-iodine   MEDICATIONS: Meds Ordered in [place]  Medication Sig Dispense Refill  . albuterol  HFA (Ventolin  HFA) 90 mcg/actuation inhaler Inhale 2 puffs every 4 (four) hours as needed.    . ALPRAZolam (XANAX) 0.5 mg tablet Take 1 tablet by mouth daily.    SABRA  atorvastatin (LIPITOR) 10 mg tablet atorvastatin 10 mg tablet    . Dulera  200-5 mcg/actuation HFAA inhaler USE 2 PUFFS TWICE DAILY    . EPINEPHrine  (EPIPEN ) 0.3 mg/0.3 mL injection syringe INJECT 0.3 MG INTO THE MUSCLE AS NEEDED    . gabapentin (NEURONTIN) 100 mg capsule Take 100 mg by mouth 3 (three) times a day for 30 days. 90 capsule 0  . galcanezumab-gnlm (Emgality Pen) 120 mg/mL pnij Inject 1 mL (120 mg total) under the skin every 30 (thirty) days. 1 Pen 5  . montelukast  (SINGULAIR ) 10 mg tablet Take 10 mg by mouth Once Daily.    . ondansetron  (ZOFRAN ) 8 mg tablet TAKE 1 TABLET BY MOUTH THREE TIMES DAILY 30 MINUTES BEFORE FOOD AS NEEDED    . oxyCODONE -acetaminophen  (PERCOCET) 10-325 mg per tablet 1 tablet as needed.    . phentermine 37.5 mg tab Take 1 tablet by mouth daily.    . topiramate  (TOPAMAX ) 25 mg tablet Indications: migraine prevention. 4 tabs in  the AM and 5 tabs at night 270 tablet 5  . zolpidem  (AMBIEN ) 10 mg tablet 10 mg nightly.    SABRA buPROPion (WELLBUTRIN SR) 150 mg 12 hr tablet Take 1 tablet by mouth daily. (Patient not taking: Reported on 01/12/2024)    . QUEtiapine (SEROquel) 25 mg tablet TAKE 1 TABLET BY MOUTH 1 HOUR BEFORE BEDTIME EXCESSIVE WORRYING AND SLEEP (Patient not taking: Reported on 01/12/2024)    . tiZANidine  (ZANAFLEX ) 4 mg tablet 1 tablet    . venlafaxine  (EFFEXOR  XR) 37.5 mg 24 hr capsule Take 1 capsule by mouth daily. (Patient not taking: Reported on 01/12/2024)     Current Facility-Administered Medications Ordered in Epic  Medication Dose Route Frequency Provider Last Rate Last Admin  . lidocaine  (XYLOCAINE ) 10 mg/mL (1 %) injection 20 mg  2 mL injection Once Stephanie Ausborn, NP      . onabotulinumtoxin type A (BOTOX) injection 200 Units  200 Units intramuscular Once Stephanie Ausborn, NP      . ROPivacaine PF (NAROPIN) 5 mg/mL (0.5 %) injection 10 mg  2 mL injection Once Corean Netter, NP          REVIEW OF SYSTEMS: All systems reviewed and found to  be negative except for the ones mentioned above  PHYSICAL EXAM:  Blood pressure 118/66, pulse 66, temperature 97.9 F (36.6 C), height 1.753 m (5' 9), weight 84.2 kg (185 lb 9.6 oz), SpO2 99%. General Appearance::Alert, cooperative,well nourished, well hydrated, no acute distress.  HEAD:Normocephalic, without obvious abnormality, atraumatic Has tenderness to touch in her occipitalis areas. EYES:PERRL, conjunctiva/corneas clear, EOM's intact,  EARS:Normal external ear canals, both ears  ZWU:Wjmzd normal, septum midline, mucosa normal, no drainage or sinus tenderness .Lips, mucosa, and tongue normal;  NECK:Supple, no carotid bruit  LUNGS:Clear to auscultation bilaterally, respirations unlabored  HEART:Regular rate and rhythm. NEUROLOGIC: MENTAL STATUS:Alert and oriented x3, speech is fluent, comprehensive,  language  is normal. CRANIAL NERVES:Visual fields intact to confrontation, funduscopic examination without optic disk edema, Pupils equal and reactive to light and near fulleye movement without nystagmus.  Facial sensation is normal, no weakness of masticatory muscles.No facial weakness or asymmetry.  The uvula is midline, the palate elevates symmetrically. The tongue is midline. No atrophy or fasciculations.SABRA  MOTOR: Muscle Strength: Strength - 5/5 and symmetric in the upper and lower extremities,(except her right deltoids due to right shoulder pain.No pronation or drift. REFLEXES: DTRs - 2+ and symmetrical in all four extremities, plantar responses are flexor bilaterally.  COORDINATION: Intact finger-to-nose,  and rapid alternating movements, no tremor.  GAIT: Routine  gait is normal.    ASSESSMENT/PLAN:   53 year old female with headaches seen in follow up.Her headaches are possibly multifactorial in etiology. She does have migrainous features as well as possibly a cervicogenic component along with a strong component of MOH . Also an IIH component is a possibility. -Discussed about the  above, will switch emgality to atogepant , discussed side effects of atogepant.We discussed stopping emgality before starting quilipta.Information provided -She is reluctant to pursue any further spinal taps. -She is followed by ortho/spine who had recommended cervical nerve blocks in the past , but is reluctant to pursue this. -Discussed MOH, need to taper and stop use of narcotics , but states that she has several painful joints , she is scheduled to have her right shoulder surgery next month.She is aware that preventive meds for headaches does not work effectively in the presence of narcotics. -Will get her OCT today with opthal. -  She is on topamax  at 225 mgs daily and will continue. -RTC in 3 months          Electronically signed by: Derinda Adolphus Sor, MD 01/12/2024 10:39 AM

## 2024-03-19 ENCOUNTER — Encounter (HOSPITAL_COMMUNITY): Payer: Self-pay

## 2024-03-19 ENCOUNTER — Emergency Department (HOSPITAL_COMMUNITY)
Admission: EM | Admit: 2024-03-19 | Discharge: 2024-03-19 | Disposition: A | Discharge status: Home / Self Care | Attending: Emergency Medicine | Admitting: Emergency Medicine

## 2024-03-19 ENCOUNTER — Other Ambulatory Visit: Payer: Self-pay

## 2024-03-19 DIAGNOSIS — Z91010 Allergy to peanuts: Secondary | ICD-10-CM | POA: Diagnosis not present

## 2024-03-19 DIAGNOSIS — M79601 Pain in right arm: Secondary | ICD-10-CM | POA: Insufficient documentation

## 2024-03-19 MED ORDER — KETOROLAC TROMETHAMINE 15 MG/ML IJ SOLN
15.0000 mg | Freq: Once | INTRAMUSCULAR | Status: AC
  Administered 2024-03-19: 15 mg via INTRAVENOUS
  Filled 2024-03-19: qty 1

## 2024-03-19 NOTE — ED Provider Notes (Signed)
 [name] Provider Note   CSN: [id number] Arrival date & time: 03/19/24  9767     Patient presents with: Arm Injury   Kathy Hodge is a 53 y.o. female.    53 year old female presenting with pain in her right shoulder. She reports surgery was 6 weeks ago for her rotator cuff and biceps. She has extensive history of surgery of her left rotator cuff (2021), back (2019), carpal tunnel release (2019, 2020). She describes the pain as 10/10, constant, throbbing, and with sharp pains that radiate down to her finger tips when there is pressure applied. She denies numbness or tingling. Radial pulses 2+. Capillary refill <2 seconds. She is currently taking oxycodone /acetaminophen  that she says she takes twice a day, which she states has not been able to control her pain. She also supplements with Ibuprofen  and Tylenol  with her last dose being at 10 PM and Ambien  for sleep. Patient denies fever, vomiting, constipation, chest pain, and palpitations. She does endorse some nausea. Patient has limited range of motion in right shoulder, minimal external rotation and unable to to abduct or raise her arm. Sensation still intact. Has not followed up with her surgeon since the injury 1 week ago- pulled a box using her right arm.        Prior to Admission medications   Medication Sig Start Date End Date Taking? Authorizing Provider  albuterol  (VENTOLIN  HFA) 108 (90 Base) MCG/ACT inhaler Inhale 1-2 puffs into the lungs every 6 (six) hours as needed for wheezing or shortness of breath. 11/22/22   Long, Joshua G, MD  ALPRAZolam (XANAX) 0.5 MG tablet Take 0.5 mg by mouth as needed for anxiety. 11/04/22   [provider]  amoxicillin -clavulanate (AUGMENTIN ) 875-125 MG tablet Take 1 tablet by mouth every 12 (twelve) hours. 04/24/23   Roselyn Carlin NOVAK, MD  cyanocobalamin (VITAMIN B12) 1000 MCG tablet Take 1,000 mcg by mouth daily. 01/22/21   [provider]  EPINEPHrine  0.3 mg/0.3 mL IJ SOAJ injection Inject 0.3 mg into the muscle as needed. 02/06/22   Marinda Rocky SAILOR, MD  fluticasone  (FLONASE ) 50 MCG/ACT nasal spray Place 1 spray into both nostrils 2 (two) times daily as needed (nasal congestion). Patient not taking: Reported on 11/22/2022 02/06/22   Marinda Rocky SAILOR, MD  gabapentin (NEURONTIN) 400 MG capsule Take 400 mg by mouth 3 (three) times daily.    [provider]  ibuprofen  (ADVIL ) 600 MG tablet Take 600 mg by mouth as needed (pain).    [provider]  levocetirizine (XYZAL ) 5 MG tablet Take 1 tablet (5 mg total) by mouth every evening. Patient taking differently: Take 5 mg by mouth as needed for allergies. 08/01/22   Marinda Rocky SAILOR, MD  mometasone-formoterol (DULERA ) 100-5 MCG/ACT AERO Inhale 2 puffs into the lungs in the morning and at bedtime. with spacer and rinse mouth afterwards. 02/06/22   Marinda Rocky SAILOR, MD  montelukast  (SINGULAIR ) 10 MG tablet TAKE 1 TABLET(10 MG) BY MOUTH AT BEDTIME Patient taking differently: Take 10 mg by mouth at bedtime. 07/29/22   Marinda Rocky SAILOR, MD  naloxone [facility]) nasal spray 4 mg/0.1 mL Place 1 spray into the nose once.    [provider]  omeprazole  (PRILOSEC) 40 MG capsule Take 1 capsule (40 mg total) by mouth daily. Take 30 minutes prior to breakfast or any meals. 02/06/22   Marinda Rocky SAILOR, MD  ondansetron  (ZOFRAN -ODT) 4 MG disintegrating tablet Take 1 tablet (4  mg total) by mouth every 8 (eight) hours as needed for nausea or vomiting. Patient not taking: Reported on 11/22/2022 08/07/21   Joldersma, Logan, PA-C  oxyCODONE -acetaminophen  (PERCOCET) 10-325 MG tablet Take 1 tablet by mouth 4 (four) times daily as needed for pain.    [provider]  phentermine (ADIPEX-P) 37.5 MG tablet Take 37.5 mg by mouth daily. 11/08/22   [provider]  tiZANidine  (ZANAFLEX ) 4 MG tablet TAKE 1 TABLET BY MOUTH EVERY 6 HOURS AS NEEDED FOR MUSCLE SPASMS Patient taking  differently: Take 4 mg by mouth every 6 (six) hours as needed for muscle spasms. 09/13/19   Kay Kemps, MD  topiramate  (TOPAMAX ) 25 MG tablet Take 100-125 mg by mouth See admin instructions. Take 4 tablets (100 mg) in the morning and 5 tablets (125 mg) at night. 03/29/22   [provider]  zolpidem  (AMBIEN ) 10 MG tablet 1 p.o. nightly Patient taking differently: Take 10 mg by mouth at bedtime. 11/16/19   Neda Jennet LABOR, MD    Allergies: Other, Peanut-containing drug products, Sesame seed (diagnostic), and Peanut oil    Review of Systems Negative except as per HPI Updated Vital Signs BP (!) 169/101   Pulse 62   Temp 98.3 F (36.8 C) (Oral)   Resp 18   Ht 5' 9.5 (1.765 m)   Wt 81.6 kg   SpO2 100%   BMI 26.20 kg/m   Physical Exam Vitals and nursing note reviewed.  Constitutional:      General: She is not in acute distress.    Appearance: She is well-developed. She is not diaphoretic.     Comments: tearful  HENT:     Head: Normocephalic and atraumatic.  Cardiovascular:     Pulses: Normal pulses.  Pulmonary:     Effort: Pulmonary effort is normal.  Musculoskeletal:        General: Tenderness present. No swelling or deformity.     Comments: Well healed surgical scars without evidence of infection. Generalized tenderness of the right arm.   Skin:    General: Skin is warm and dry.  Neurological:     Mental Status: She is alert and oriented to person, place, and time.     Sensory: No sensory deficit.  Psychiatric:        Behavior: Behavior normal.     (all labs ordered are listed, but only abnormal results are displayed) Labs Reviewed - No data to display  EKG: None  Radiology: No results found.   Procedures   Medications Ordered in the ED  ketorolac  (TORADOL ) 15 MG/ML injection 15 mg (15 mg Intravenous Given 03/19/24 0417)                                    Medical Decision Making  54 year old female with complaint of pain in the right  shoulder. 6 weeks s/p right shoulder surgery. Patient has not had a post op visit yet, is scheduled next week. Patient reports pushing something last week resulting in pain in her right arm. Patient has been taking her regularly scheduled meds without pain control. Has not notified her surgeon of her injury. On exam, generalized tenderness of her right arm without evidence of infection. Sensation is intact, motor intact. Not in sling.  Recommend patient follow-up with her surgeon, contact office today to discuss incident which occurred last week.  Provided with Toradol  for pain in the ER today.  Final diagnoses:  Right arm pain    ED Discharge Orders     None          Beverley Leita DELENA DEVONNA 03/19/24 0427    Palumbo, April, MD 03/19/24 [phone]

## 2024-03-19 NOTE — Discharge Instructions (Signed)
 Call your orthopedic surgeon in the morning to discuss your pain.

## 2024-03-19 NOTE — ED Triage Notes (Signed)
 PT had surgery 6 weeks ago for rotator cuff tear and bicep tendons.  1 week ago it started hurting again. BP 122/92 RR 20 98% RA P 78

## 2024-07-01 NOTE — Progress Notes (Addendum)
 Subjective Patient ID: Kathy Hodge is a 53 y.o. female.   CHIEF COMPLAINT: Pain of the Right Shoulder    History of Present Illness The patient presents for right shoulder pain.  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 The incident occurred when she collided with an object on the wall, resulting in immediate pain. She sought medical attention the following day and underwent x-rays of both shoulders and right elbow in the emergency room. An MRI was also performed at [facility]. She has been participating in physical therapy sessions. It is noteworthy that she has a history of surgical intervention on her right shoulder, which was performed by a different surgeon.  She previously saw [facility].  SOCIAL HISTORY Occupations: Works as a LAWYER.     Objective    PHYSICAL EXAMINATION: General appearance: no acute distress. Psychiatric exam: normal mood. HEENT exam: normocephalic, atraumatic.  Extraocular muscles are intact.  Membranes are pink.  Hearing is intact.   Neck is supple.    Respiratory:  No obvious wheezes.    Cardiovascular exam: Equal pulses.  GI: Abdomen soft. Neurologic exam: normal sensation, unless otherwise noted. Vascular exam: with brisk capillary refill, unless otherwise noted. Skin exam: no obvious lesions, unless otherwise noted. Lymphatic exam: no lymphadenopathy, unless otherwise noted.   Physical Exam Musculoskeletal: Right shoulder: Visual inspection: No bruising or ecchymosis. Tenderness: Generalized. Range of motion: Severely limited with active assisted forward elevation supine is 80 degrees. Strength testing: Supraspinatus strength testing is not performed due to pain. Infraspinatus strength testing is normal.  Stability: Normal. Special test: Impingement positive.    IMAGING RESULTS: MRI shoulder right wo contrast MRI SHOULDER RIGHT WO  CLINICAL INFORMATION:   Shoulder pain, chronic, rotator cuff disorder suspected, xray done.  COMPARISON: None available.  TECHNIQUE: Multiplanar, multiecho imaging of the shoulder was performed, including T1-weighted and fluid sensitive sequences  FINDINGS: Image quality is degraded by patient motion. Within this confine:  Marrow: No acute fracture or avascular necrosis.  Acromioclavicular joint: Prior distal clavicular excision.  SA-SD bursa: Small amount of bursal fluid.  Rotator cuff: Supraspinatus : Tendinosis with low-grade interstitial tearing of the distal tendon. Infraspinatus: Tendinosis, intact. Subscapularis: Intact. Teres minor: Intact.   Cuff muscles: Preserved bulk and signal.  Long head biceps tendon: Prior biceps tenodesis.  Rotator Interval: Marked infiltration of the anatomic fat.  Axillary pouch: Intact, with thickening of the inferior joint capsule, capsular and pericapsular edema.  Labrum: Blunting and fraying of the superior labrum in the setting of prior biceps tenodesis.  Cartilage: Intact.  IMPRESSION:  1.  Supraspinatus tendinosis with low-grade interstitial tear of the distal tendon. 2.  Infraspinatus tendinosis without a focal tear. 3.  Prior biceps tenodesis. 4.  Findings which could reflect adhesive capsulitis in the appropriate clinical setting.. 5.  Prior distal clavicular excision.  Electronically Signed by Schuyler Odor, MD; 06/29/2024 4:46 PM    Results Imaging  - X-ray of right shoulder: 06/23/2024, No new fracture, alignment of glenohumeral joint is fine, no arthritis.  - MRI of right shoulder 06/29/2024 personally reviewed by myself: Rotator cuff tendons look intact, presence of inflammatory subacromial space bursitis.  Prior biceps tenodesis and distal clavicle resection noted.    ENCOUNTER DIAGNOSES: Adhesive capsulitis of right shoulder   Assessment & Plan 1. Right shoulder pain. Severe pain in the right shoulder following an injury at work on  06/14/2024. An x-ray performed on 06/23/2024 showed no new fractures,  and an MRI indicated a potential rotator cuff tear and inflammatory bursitis. Physical examination revealed limited active assisted forward elevation supine to 80 degrees, normal infraspinatus strength, and generalized tenderness.  A Medrol Dosepak will be prescribed to reduce inflammation. A cortisone injection will be administered today to prevent stiffness. She is advised to continue physical therapy. Discussed the benefits of the Medrol Dosepak and cortisone injection, including reducing inflammation and preventing stiffness. Risks such as potential side effects of the medication and injection were also discussed. She does not have diabetes, which is relevant for the Medrol Dosepak. She will start another medication for arthritis of the spine after completing the Medrol Dosepak. She was instructed on how to expose the back of her shoulder for the injection.  Follow-up: The patient will follow up in 2 months.  PROCEDURE Procedure: Cortisone injection to right shoulder - Procedural Discussion: Discussed the option of cortisone injection to alleviate pain and prevent stiffness in the right shoulder. Benefits include reducing inflammation and pain. Risks include potential discomfort at the injection site and temporary increase in pain. Alternative options such as physical therapy were reviewed. - Technique: Injection administered directly to the joint from behind.   NSAIDS prescribed and can have side effects including but not limited to kidney damage and gastrointestinal pain or ulcers.  Following up with your primary medical doctor is recommended if you continue taking NSAIDS greater than 3 months regularly.  If you notice stomach or kidney issues, please discontinue use of NSAIDS and contact your primary medical doctor.  Patient ID: Kathy Hodge is a 53 y.o. female.  *L Inj/Asp: R subacromial bursa on 07/01/2024 3:50  PM Indications: pain Details: posterior approach Medications: 40 mg triamcinolone acetonide 40 MG/ML; 2 mL lidocaine  1 % Procedure, treatment alternatives, risks and benefits explained, specific risks discussed. Consent was given by the patient. Patient was prepped and draped in the usual sterile fashion.      Orders Placed This Encounter  . Ambulatory Referral to Physical Therapy  . Follow-up with Me  . methylPREDNISolone (Medrol Dospak) 4 MG tablets  . diclofenac (Voltaren) 75 MG EC tablet    History reviewed. No pertinent past medical history. Patient Active Problem List  Diagnosis  . Acid reflux  . Acute postoperative pain  . Acute right ankle pain  . Anaphylactic shock due to adverse food reaction  . Asthma  . Intermittent asthma without complication  . Asthma, well controlled, moderate persistent  . Bacterial vaginosis  . Carpal tunnel syndrome of right wrist  . Cervicalgia  . Chronic left shoulder pain  . Chronic pain due to trauma  . Chronic pain syndrome  . Chronic rhinitis  . COVID-19 virus infection  . Disorder of left rotator cuff  . Dysmenorrhea  . Essential hypertension  . Fasciitis  . Heartburn  . History of hysterectomy  . Hyperalgesia  . Hypercholesterolemia  . Intercostal neuropathic pain  . Itching  . Leg pain  . Low back pain  . Lumbar back pain with radiculopathy affecting right lower extremity  . Thoracic back pain  . Degeneration of intervertebral disc of lumbosacral region  . Degeneration of lumbar intervertebral disc  . Lumbar disc disease  . Lumbar disc herniation with radiculopathy  . Migraine  . Myofascial pain  . Obesity (BMI 30.0-34.9)  . Obesity  . Obstructive sleep apnea syndrome  . Abdominal pain  . Pain in pelvis  . Pruritus of vagina  . S/P carpal tunnel release  . 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 S/P spinal fusion  . Sacroiliac joint pain  . Sacroiliitis, not elsewhere classified  . Sciatica   . Screening for diabetes mellitus  . Seasonal and perennial allergic rhinitis  . Sinus bradycardia  . Sore throat  . Spinal stenosis  . Urinary frequency  . Vaccine counseling  . Vaginal odor   History reviewed. No pertinent surgical history. No family history on file. Social History   Socioeconomic History  . Marital status: Unknown  Tobacco Use  . Smoking status: Never  . Smokeless tobacco: Never  Vaping Use  . Vaping status: Never Used   Social Drivers of Health   Food Insecurity: Low Risk  (06/15/2024)   Received from [facility]   Hunger Vital Sign   . Within the past 12 months, you worried that your food would run out before you got money to buy more: Never true   . Within the past 12 months, the food you bought just didn't last and you didn't have money to get more. : Never true  Transportation Needs: No Transportation Needs (06/15/2024)   Received from [facility]   . In the past 12 months, has lack of reliable transportation kept you from medical appointments, meetings, work or from getting things needed for daily living? : No  Intimate Partner Violence: Not At Risk (10/27/2018)   Received from [facility]   Humiliation, Afraid, Rape, and Kick questionnaire   . Within the last year, have you been afraid of your partner or ex-partner?: No   . Within the last year, have you been humiliated or emotionally abused in other ways by your partner or ex-partner?: No   . Within the last year, have you been kicked, hit, slapped, or otherwise physically hurt by your partner or ex-partner?: No   . Within the last year, have you been raped or forced to have any kind of sexual activity by your partner or ex-partner?: No  Housing Stability: Low Risk  (06/15/2024)   Received from [facility] Stability Vital Sign   . What is your living situation today?: I have a steady place to live   . Think about the place you live. Do you have problems with any  of the following? Choose all that apply:: None/None on this list   Prior to Admission medications  Medication Sig Start Date End Date Taking? Authorizing Provider  acetaminophen -codeine (Tylenol  w/ Codeine #3) 300-30 MG tablet Take 1 tablet by mouth. 04/21/18  Yes Historical Provider, MD  albuterol  108 (90 Base) MCG/ACT inhaler INHALE 2 PUFFS BY MOUTH FOUR TIMES DAILY AS NEEDED FOR SHORTNESS OF BREATH OR WHEEZING 06/09/17  Yes Historical Provider, MD  ALPRAZolam (Xanax) 0.5 MG tablet Take 1 tablet (0.5 mg) by mouth in the morning. 11/04/22  Yes Historical Provider, MD  amLODIPine  (Norvasc ) 5 MG tablet Take 1 tablet (5 mg) by mouth.   Yes Historical Provider, MD  Atogepant 60 MG tablet Take 60 mg by mouth 1 (one) time each day at the same time. 01/12/24  Yes Historical Provider, MD  atorvastatin (Lipitor) 40 MG tablet Take 1 tablet (40 mg) by mouth at bedtime.   Yes Historical Provider, MD  EPINEPHrine  (Epipen ) 0.3 MG/0.3ML injection syringe INJECT 0.3 MG INTO THE MUSCLE AS NEEDED 02/06/22  Yes Historical Provider, MD  ergocalciferol (Vitamin D2) 1.25 MG (50000 UT) capsule Take 1 capsule (1.25 mg) by mouth.   Yes Historical Provider, MD  gabapentin (  Neurontin) 600 MG tablet Take 1 tablet (600 mg) by mouth in the morning and 1 tablet (600 mg) at noon and 1 tablet (600 mg) in the evening.   Yes Historical Provider, MD  galcanezumab (Emgality) 120 MG/ML auto-injector INJECT 2 ML SUBCUTANEOUS ONCE FOR 1 DOSE. FIRST MONTH LOADING DOSE   Yes Historical Provider, MD  montelukast  (Singulair ) 10 MG tablet Take 1 tablet (10 mg) by mouth at bedtime. 05/26/17  Yes Historical Provider, MD  sennosides (Senokot) 8.6 MG tablet Take 2 tablets (17.2 mg) by mouth in the morning.   Yes Historical Provider, MD  tiZANidine  (Zanaflex ) 4 MG tablet 1 tablet 09/13/19  Yes Historical Provider, MD  topiramate  (Topamax ) 100 MG tablet  06/17/24  Yes Historical Provider, MD  topiramate  (Topamax ) 25 MG tablet Indications: migraine  prevention. 4 tabs in the AM and 5 tabs at night 03/29/22  Yes Historical Provider, MD  triamcinolone acetonide (Kenalog-40) 40 MG/ML injection Inject 1 mL (40 mg) into the shoulder, thigh, or buttocks. 03/17/12  Yes Historical Provider, MD  venlafaxine  XR (Effoxor-XR) 37.5 MG 24 hr capsule Take 1 capsule (37.5 mg) by mouth in the morning. 12/08/23  Yes Historical Provider, MD  zolpidem  (Ambien ) 5 MG tablet take 1 tablet by mouth at bedtime for sleep 05/26/17  Yes Historical Provider, MD  ascorbic acid (Vitamin C) 1000 MG tablet Take 1 tablet (1,000 mg) by mouth 1 (one) time each day.  07/01/24  Historical Provider, MD  atorvastatin (Lipitor) 10 MG tablet atorvastatin 10 mg tablet 01/22/21 07/01/24  Historical Provider, MD  DULoxetine (Cymbalta) 60 MG DR capsule Take 1 capsule (60 mg) by mouth in the morning and 1 capsule (60 mg) in the evening. 07/27/12 07/01/24  Historical Provider, MD  gabapentin (Neurontin) 400 MG capsule Take 1 capsule (400 mg) by mouth.  07/01/24  Historical Provider, MD  hydroCHLOROthiazide  (HYDRODiuril ) 12.5 MG tablet Take 1 tablet (12.5 mg) by mouth in the morning.  07/01/24  Historical Provider, MD  ibuprofen  600 MG tablet Take 1 tablet (600 mg) by mouth.  07/01/24  Historical Provider, MD  phentermine (Adipex-P) 37.5 MG tablet Take 1 tablet (37.5 mg) by mouth in the morning.  07/01/24  Historical Provider, MD  QUEtiapine (SEROquel) 25 MG tablet TAKE 1 TABLET BY MOUTH 1 HOUR BEFORE BEDTIME EXCESSIVE WORRYING AND SLEEP 11/06/23 07/01/24  Historical Provider, MD  ropivacaine (Naropin) 5 MG/ML injection Infuse 2 mL (10 mg) into a venous catheter. 12/12/23 07/01/24  Historical Provider, MD  tinidazole (Tindamax) 500 MG tablet TAKE 2 TABLETS BY MOUTH EVERY DAY FOR 5 DAYS AS DIRECTED 06/01/24 07/01/24  Historical Provider, MD  zolpidem  (Ambien ) 10 MG tablet 1 tablet (10 mg) in the morning. 03/08/19 07/01/24  Historical Provider, MD    Allergies:  Allergies  Allergen Reactions  .  Peanut-Containing Drug Products Anaphylaxis and Other    Pt states she is not allergic to peanuts but is allergic to all other nuts.  . Peanuts Anaphylaxis    All nuts  . Sesame Oil Anaphylaxis     Review of Systems: Constitutional: Appears stated age. HEENT: No trauma. Respiratory: No obvious wheezes Cardiovascular: Normal equal pulses bilaterally. GI: No acute changes. Endocrine: No acute changes. GU: negative. Skin: negative. Allergy /Immunology: negative. Neurologic: No dizziness. Hematologic: negative Psychiatric: negative Musculoskeletal: See HPI.   Note: This note has been created with voice recognition software.  While this note has been edited for accuracy, the software periodically misinterprets speech resulting in errors that might not have been caught  in editing.  In the event you find an unusual error in this record, please notify us  at [phone] to resolve the same.
